# Patient Record
Sex: Female | Born: 1935
Health system: Southern US, Community
[De-identification: ages and names within clinical notes are randomized; demographics above are authoritative.]

## PROBLEM LIST (undated history)

## (undated) DIAGNOSIS — E785 Hyperlipidemia, unspecified: Secondary | ICD-10-CM

## (undated) DIAGNOSIS — I499 Cardiac arrhythmia, unspecified: Secondary | ICD-10-CM

## (undated) DIAGNOSIS — R443 Hallucinations, unspecified: Secondary | ICD-10-CM

## (undated) DIAGNOSIS — R413 Other amnesia: Secondary | ICD-10-CM

## (undated) DIAGNOSIS — K559 Vascular disorder of intestine, unspecified: Secondary | ICD-10-CM

## (undated) DIAGNOSIS — B359 Dermatophytosis, unspecified: Secondary | ICD-10-CM

## (undated) DIAGNOSIS — E669 Obesity, unspecified: Secondary | ICD-10-CM

## (undated) DIAGNOSIS — I10 Essential (primary) hypertension: Secondary | ICD-10-CM

## (undated) DIAGNOSIS — R739 Hyperglycemia, unspecified: Secondary | ICD-10-CM

## (undated) DIAGNOSIS — F0390 Unspecified dementia without behavioral disturbance: Secondary | ICD-10-CM

## (undated) DIAGNOSIS — Q669 Congenital deformity of feet, unspecified, unspecified foot: Secondary | ICD-10-CM

## (undated) DIAGNOSIS — F419 Anxiety disorder, unspecified: Secondary | ICD-10-CM

## (undated) DIAGNOSIS — M75100 Unspecified rotator cuff tear or rupture of unspecified shoulder, not specified as traumatic: Secondary | ICD-10-CM

## (undated) HISTORY — DX: Anxiety disorder, unspecified: F41.9

## (undated) HISTORY — DX: Hallucinations, unspecified: R44.3

## (undated) HISTORY — DX: Hyperlipidemia, unspecified: E78.5

## (undated) HISTORY — DX: Vascular disorder of intestine, unspecified: K55.9

## (undated) HISTORY — DX: Dermatophytosis, unspecified: B35.9

## (undated) HISTORY — DX: Other amnesia: R41.3

## (undated) HISTORY — DX: Hyperglycemia, unspecified: R73.9

## (undated) HISTORY — DX: Congenital deformity of feet, unspecified, unspecified foot: Q66.90

## (undated) HISTORY — DX: Obesity, unspecified: E66.9

## (undated) HISTORY — DX: Essential (primary) hypertension: I10

## (undated) HISTORY — DX: Unspecified rotator cuff tear or rupture of unspecified shoulder, not specified as traumatic: M75.100

---

## 1979-05-02 HISTORY — PX: TOTAL ABDOMINAL HYSTERECTOMY: SHX209

## 1997-10-01 HISTORY — PX: HUMERUS FRACTURE SURGERY: SHX670

## 1998-12-28 ENCOUNTER — Emergency Department (HOSPITAL_COMMUNITY): Admission: EM | Admit: 1998-12-28 | Discharge: 1998-12-28 | Payer: Self-pay | Admitting: Emergency Medicine

## 1998-12-28 ENCOUNTER — Encounter: Payer: Self-pay | Admitting: Emergency Medicine

## 1999-08-11 ENCOUNTER — Encounter: Payer: Self-pay | Admitting: Family Medicine

## 1999-08-11 ENCOUNTER — Encounter: Admission: RE | Admit: 1999-08-11 | Discharge: 1999-08-11 | Payer: Self-pay | Admitting: Family Medicine

## 2000-08-02 ENCOUNTER — Other Ambulatory Visit: Admission: RE | Admit: 2000-08-02 | Discharge: 2000-08-02 | Payer: Self-pay | Admitting: Family Medicine

## 2000-08-13 ENCOUNTER — Encounter: Admission: RE | Admit: 2000-08-13 | Discharge: 2000-08-13 | Payer: Self-pay | Admitting: Family Medicine

## 2000-08-13 ENCOUNTER — Encounter: Payer: Self-pay | Admitting: Family Medicine

## 2002-11-20 ENCOUNTER — Encounter: Admission: RE | Admit: 2002-11-20 | Discharge: 2002-11-20 | Payer: Self-pay | Admitting: Family Medicine

## 2002-11-20 ENCOUNTER — Encounter: Payer: Self-pay | Admitting: Family Medicine

## 2003-12-04 ENCOUNTER — Encounter: Admission: RE | Admit: 2003-12-04 | Discharge: 2003-12-04 | Payer: Self-pay | Admitting: Family Medicine

## 2004-08-13 ENCOUNTER — Ambulatory Visit: Payer: Self-pay | Admitting: Family Medicine

## 2004-11-11 ENCOUNTER — Ambulatory Visit: Payer: Self-pay | Admitting: Family Medicine

## 2004-11-12 ENCOUNTER — Ambulatory Visit: Payer: Self-pay | Admitting: Family Medicine

## 2004-11-27 ENCOUNTER — Ambulatory Visit: Payer: Self-pay | Admitting: Family Medicine

## 2004-12-24 ENCOUNTER — Ambulatory Visit: Payer: Self-pay | Admitting: Family Medicine

## 2005-06-17 ENCOUNTER — Ambulatory Visit: Payer: Self-pay | Admitting: Family Medicine

## 2005-07-17 ENCOUNTER — Emergency Department (HOSPITAL_COMMUNITY): Admission: EM | Admit: 2005-07-17 | Discharge: 2005-07-17 | Payer: Self-pay | Admitting: Emergency Medicine

## 2005-08-10 ENCOUNTER — Ambulatory Visit: Payer: Self-pay | Admitting: Family Medicine

## 2005-08-27 ENCOUNTER — Encounter: Admission: RE | Admit: 2005-08-27 | Discharge: 2005-08-27 | Payer: Self-pay | Admitting: Family Medicine

## 2005-11-09 ENCOUNTER — Ambulatory Visit: Payer: Self-pay | Admitting: Family Medicine

## 2005-11-16 ENCOUNTER — Ambulatory Visit: Payer: Self-pay | Admitting: Family Medicine

## 2005-12-02 ENCOUNTER — Ambulatory Visit: Payer: Self-pay | Admitting: Family Medicine

## 2006-02-22 ENCOUNTER — Ambulatory Visit: Payer: Self-pay | Admitting: Internal Medicine

## 2006-07-06 ENCOUNTER — Ambulatory Visit: Payer: Self-pay | Admitting: Family Medicine

## 2007-01-31 ENCOUNTER — Encounter: Admission: RE | Admit: 2007-01-31 | Discharge: 2007-01-31 | Payer: Self-pay | Admitting: Family Medicine

## 2007-02-09 ENCOUNTER — Encounter (INDEPENDENT_AMBULATORY_CARE_PROVIDER_SITE_OTHER): Payer: Self-pay | Admitting: *Deleted

## 2007-06-24 ENCOUNTER — Ambulatory Visit: Payer: Self-pay | Admitting: Family Medicine

## 2007-07-20 ENCOUNTER — Ambulatory Visit: Payer: Self-pay | Admitting: Family Medicine

## 2007-08-02 ENCOUNTER — Encounter (INDEPENDENT_AMBULATORY_CARE_PROVIDER_SITE_OTHER): Payer: Self-pay | Admitting: *Deleted

## 2007-08-02 ENCOUNTER — Ambulatory Visit: Payer: Self-pay | Admitting: Family Medicine

## 2007-08-03 ENCOUNTER — Ambulatory Visit: Payer: Self-pay | Admitting: Family Medicine

## 2007-08-19 ENCOUNTER — Ambulatory Visit: Payer: Self-pay | Admitting: Family Medicine

## 2007-08-22 ENCOUNTER — Encounter (INDEPENDENT_AMBULATORY_CARE_PROVIDER_SITE_OTHER): Payer: Self-pay | Admitting: *Deleted

## 2008-04-11 ENCOUNTER — Encounter (INDEPENDENT_AMBULATORY_CARE_PROVIDER_SITE_OTHER): Payer: Self-pay | Admitting: *Deleted

## 2008-04-13 ENCOUNTER — Ambulatory Visit: Payer: Self-pay | Admitting: Family Medicine

## 2008-04-25 ENCOUNTER — Ambulatory Visit: Payer: Self-pay

## 2008-04-25 ENCOUNTER — Encounter: Payer: Self-pay | Admitting: Family Medicine

## 2008-05-09 ENCOUNTER — Ambulatory Visit: Payer: Self-pay | Admitting: Family Medicine

## 2008-06-05 ENCOUNTER — Ambulatory Visit: Payer: Self-pay | Admitting: Family Medicine

## 2008-07-17 ENCOUNTER — Encounter: Admission: RE | Admit: 2008-07-17 | Discharge: 2008-07-17 | Payer: Self-pay | Admitting: Family Medicine

## 2008-07-17 LAB — HM MAMMOGRAPHY: HM Mammogram: NORMAL

## 2008-07-18 ENCOUNTER — Encounter (INDEPENDENT_AMBULATORY_CARE_PROVIDER_SITE_OTHER): Payer: Self-pay | Admitting: *Deleted

## 2008-08-16 ENCOUNTER — Ambulatory Visit: Payer: Self-pay | Admitting: Family Medicine

## 2008-08-16 LAB — CONVERTED CEMR LAB
AST: 23 units/L (ref 0–37)
Albumin: 3.9 g/dL (ref 3.5–5.2)
Alkaline Phosphatase: 78 units/L (ref 39–117)
BUN: 13 mg/dL (ref 6–23)
Chloride: 104 meq/L (ref 96–112)
Eosinophils Absolute: 0.2 10*3/uL (ref 0.0–0.7)
Eosinophils Relative: 3.3 % (ref 0.0–5.0)
GFR calc non Af Amer: 87 mL/min
Glucose, Bld: 108 mg/dL — ABNORMAL HIGH (ref 70–99)
HDL: 40.5 mg/dL (ref >39.0)
LDL Cholesterol: 82 mg/dL (ref 0–99)
Lymphocytes Relative: 29.2 % (ref 12.0–46.0)
Monocytes Relative: 10.4 % (ref 3.0–12.0)
Neutrophils Relative %: 56.7 % (ref 43.0–77.0)
Platelets: 241 10*3/uL (ref 150–400)
Potassium: 4.2 meq/L (ref 3.5–5.1)
TSH: 2.83 microintl units/mL (ref 0.35–5.50)
Total CHOL/HDL Ratio: 3.5
VLDL: 18 mg/dL (ref 0–40)
WBC: 5.6 10*3/uL (ref 4.5–10.5)

## 2008-08-21 ENCOUNTER — Ambulatory Visit: Payer: Self-pay | Admitting: Family Medicine

## 2008-08-21 DIAGNOSIS — E749 Disorder of carbohydrate metabolism, unspecified: Secondary | ICD-10-CM | POA: Insufficient documentation

## 2008-09-21 ENCOUNTER — Ambulatory Visit: Payer: Self-pay | Admitting: Family Medicine

## 2008-09-21 ENCOUNTER — Encounter (INDEPENDENT_AMBULATORY_CARE_PROVIDER_SITE_OTHER): Payer: Self-pay | Admitting: *Deleted

## 2008-09-21 LAB — CONVERTED CEMR LAB: OCCULT 3: NEGATIVE

## 2009-05-08 ENCOUNTER — Telehealth: Payer: Self-pay | Admitting: Family Medicine

## 2009-06-05 ENCOUNTER — Ambulatory Visit: Payer: Self-pay | Admitting: Family Medicine

## 2009-09-17 ENCOUNTER — Ambulatory Visit: Payer: Self-pay | Admitting: Family Medicine

## 2009-09-19 LAB — CONVERTED CEMR LAB
ALT: 21 units/L (ref 0–35)
Albumin: 4.3 g/dL (ref 3.5–5.2)
Basophils Relative: 0.8 % (ref 0.0–3.0)
CO2: 30 meq/L (ref 19–32)
Calcium: 9.5 mg/dL (ref 8.4–10.5)
Chloride: 104 meq/L (ref 96–112)
Cholesterol: 153 mg/dL (ref 0–200)
Eosinophils Absolute: 0.2 10*3/uL (ref 0.0–0.7)
Glucose, Bld: 90 mg/dL (ref 70–99)
HCT: 45.2 % (ref 36.0–46.0)
Hemoglobin: 14.3 g/dL (ref 12.0–15.0)
MCHC: 31.6 g/dL (ref 30.0–36.0)
MCV: 94.6 fL (ref 78.0–100.0)
Monocytes Absolute: 0.6 10*3/uL (ref 0.1–1.0)
Neutro Abs: 3.6 10*3/uL (ref 1.4–7.7)
Potassium: 4.7 meq/L (ref 3.5–5.1)
RBC: 4.77 M/uL (ref 3.87–5.11)
TSH: 2.37 microintl units/mL (ref 0.35–5.50)
Total Protein: 7.1 g/dL (ref 6.0–8.3)
VLDL: 29 mg/dL (ref 0.0–40.0)

## 2009-09-25 ENCOUNTER — Ambulatory Visit: Payer: Self-pay | Admitting: Family Medicine

## 2009-09-25 DIAGNOSIS — M719 Bursopathy, unspecified: Secondary | ICD-10-CM

## 2009-09-25 DIAGNOSIS — M67919 Unspecified disorder of synovium and tendon, unspecified shoulder: Secondary | ICD-10-CM | POA: Insufficient documentation

## 2009-10-17 ENCOUNTER — Encounter: Payer: Self-pay | Admitting: Family Medicine

## 2009-11-20 ENCOUNTER — Ambulatory Visit: Payer: Self-pay | Admitting: Family Medicine

## 2009-11-28 ENCOUNTER — Ambulatory Visit: Payer: Self-pay | Admitting: Family Medicine

## 2009-11-29 ENCOUNTER — Encounter: Payer: Self-pay | Admitting: Family Medicine

## 2010-05-22 ENCOUNTER — Ambulatory Visit: Payer: Self-pay | Admitting: Family Medicine

## 2010-09-28 LAB — CONVERTED CEMR LAB
BUN: 12 mg/dL (ref 6–23)
Calcium: 9.6 mg/dL (ref 8.4–10.5)
Creatinine, Ser: 0.7 mg/dL (ref 0.4–1.2)
Creatinine,U: 185.2 mg/dL
GFR calc Af Amer: 106 mL/min
Microalb, Ur: 0.5 mg/dL (ref 0.0–1.9)
Potassium: 3.8 meq/L (ref 3.5–5.1)
Total CHOL/HDL Ratio: 5.7
Triglycerides: 115 mg/dL (ref 0–149)
VLDL: 23 mg/dL (ref 0–40)

## 2010-09-30 NOTE — Miscellaneous (Signed)
Summary: PT Initial Eval/[facility]  PT Initial Eval/[facility]   Imported By: Lanelle Bal 10/19/2009 11:46:49  _____________________________________________________________________  External Attachment:    Type:   Image     Comment:   External Document

## 2010-09-30 NOTE — Letter (Signed)
Summary: Results Follow up Letter  [place] at [facility]  [address] [place], [place] [address]   Phone: [phone]  Fax: [phone]    11/29/2009 MRN: [id number]  [facility] [address] [place], [place]  [address]  Dear Hannah Duran,  The following are the results of your recent test(s):  Test         Result    Pap Smear:        Normal _____  Not Normal _____ Comments: ______________________________________________________ Cholesterol: LDL(Bad cholesterol):         Your goal is less than:         HDL (Good cholesterol):       Your goal is more than: Comments:  ______________________________________________________ Mammogram:        Normal _____  Not Normal _____ Comments:  ___________________________________________________________________ Hemoccult:        Normal __X___  Not normal _______ Comments:  Negative for blood  _____________________________________________________________________ Other Tests:    We routinely do not discuss normal results over the telephone.  If you desire a copy of the results, or you have any questions about this information we can discuss them at your next office visit.   Sincerely,      [name], MD

## 2010-09-30 NOTE — Assessment & Plan Note (Signed)
Summary: FLU SHOT/CLE   Nurse Visit   Allergies: 1)  ! Sulfamethoxazole-Tmp Ds (Sulfamethoxazole-Trimethoprim) 2)  ! Penicillin  Immunizations Administered:  Influenza Vaccine # 1:    Vaccine Type: Fluvax 3+    Site: left deltoid    Mfr: GlaxoSmithKline    Dose: 0.5 ml    Route: IM    Given by: Mervin Hack CMA (AAMA)    Exp. Date: 02/28/2011    Lot #: [id number]    VIS given: 03/25/10 version given May 22, 2010.  Flu Vaccine Consent Questions:    Do you have a history of severe allergic reactions to this vaccine? no    Any prior history of allergic reactions to egg and/or gelatin? no    Do you have a sensitivity to the preservative Thimersol? no    Do you have a past history of Guillan-Barre Syndrome? no    Do you currently have an acute febrile illness? no    Have you ever had a severe reaction to latex? no    Vaccine information given and explained to patient? yes    Are you currently pregnant? no  Orders Added: 1)  Flu Vaccine 23yrs + [90658] 2)  Admin 1st Vaccine [28413]

## 2010-09-30 NOTE — Assessment & Plan Note (Signed)
Summary: SHOULDER PAIN PER DR TOWER/[facility]   Vital Signs:  Patient profile:   77 year old female Height:      65 inches Weight:      239.6 pounds BMI:     40.02 Temp:     97.4 degrees F oral Pulse rate:   68 / minute Pulse rhythm:   regular BP sitting:   110 / 72  (left arm) Cuff size:   large  Vitals Entered By: Benny Lennert CMA Duncan Dull) (September 25, 2009 11:07 AM) CC: RIGHT SHOULDER/ARM PAIN SINCE NOVEMBER Is Patient Diabetic? No   History of Present Illness: The patient noted above presents with shoulder pain that has been ongoing for 3 months there is no history of trauma or accident, but she recalls hurting her shoulder with raking leaves The patient denies neck pain or radicular symptoms. Denies dislocation, subluxation, separation of the shoulder. H/o proximal humerus fx on the left. The patient does complain of pain in the overhead plane and pain with IROM Lateral pain that awakens at night  Medications Tried: motrin Tried PT: No  None on R Prior shoulder Injury: No Prior surgery: No Prior fracture: No  Handedness: R  Allergies: 1)  ! Sulfamethoxazole-Tmp Ds (Sulfamethoxazole-Trimethoprim) 2)  ! Penicillin  Past History:  Past medical, surgical, family and social histories (including risk factors) reviewed, and no changes noted (except as noted below).  Past Medical History: Reviewed history from 09/17/2009 and no changes required. obesity  HTN  hyperlipidemia  hyperglycemia   Past Surgical History: Reviewed history from 08/21/2008 and no changes required. NSVD x2 1 miscarriage TAH due to menometrorrhagia 1980s Fractured Hunerus 10/1997 LE Doppler nml 8/26//2009 ECHO LAE;LVH:(12/1992)  Family History: Reviewed history from 09/17/2009 and no changes required. Father dec 88 Ca Bladder Mother dec 94 NH natural causes/(/STROKE//PARKINSONS//DEMENTIA) only child CV: POSITIVE MGM HBP: POSITIVE MOTHER  DM: NEGATIVE GOUT/ARTHRITIS: POSITIVE  OTHER:  POSITIVE STROKE MOTHER  Social History: Reviewed history from 09/17/2009 and no changes required. Occupation: retired [employer] operational officer Married lives with husb  2 children never smoked  no alcohol  walks for exercise - less in the winter/ aims for several times per week   Review of Systems       REVIEW OF SYSTEMS  GEN: No systemic complaints, no fevers, chills, sweats, or other acute illnesses MSK: Detailed in the HPI GI: tolerating PO intake without difficulty Neuro: No numbness, parasthesias, or tingling associated. Otherwise the pertinent positives of the ROS are noted above.    Physical Exam  General:  GEN: Well-developed,well-nourished,in no acute distress; alert,appropriate and cooperative throughout examination HEENT: Normocephalic and atraumatic without obvious abnormalities. No apparent alopecia or balding. Ears, externally no deformities PULM: Breathing comfortably in no respiratory distress EXT: No clubbing, cyanosis, or edema PSYCH: Normally interactive. Cooperative during the interview. Pleasant. Friendly and conversant. Not anxious or depressed appearing. Normal, full affect.  Msk:  Shoulder: R Inspection: No muscle wasting or winging Ecchymosis/edema: neg  AC joint, scapula, clavicle: NT - mildly tender at [facility] joint Cervical spine: NT, full ROM Spurling's: neg Abduction: full, 5/5 Flexion: full, 5/5 IR, full, lift-off: 5/5 ER at neutral: full, 5/5 AC crossover: pos, r Neer: pos Hawkins: mild + Drop Test: neg Empty Can: pos Supraspinatus insertion: mild  T Bicipital groove: NT Speed's: neg Yergason's: neg Sulcus sign: neg Scapular dyskinesis: none C5-T1 intact  Neuro: Sensation intact Grip 5/5    Impression & Recommendations:  Problem # 1:  ROTATOR CUFF SYNDROME, RIGHT (ICD-726.10) Assessment New Shoulder anatomy  was reviewed with the patient using and anatomical model.   Rotator cuff strengthening and scapular stabilization exercises were reviewed  with the patient.  A handout was given based on a shoulder program from Dr. Graciella Freer of [facility] and the [facility].  Retraining shoulder mechanics and function was emphasized to the patient with rehab done at least 5-6 days a week.  The patient could benefit from formal PT to assist with scapular stabilization and RTC strengthening.   Shoulder not terribly inflammed now - will gauge improvement at f/u and if minimally better, would do subac inj  cc: Dr. Milinda Antis  Problem # 2:  SHOULDER PAIN, RIGHT (ICD-719.41) Assessment: New Probable AC arthropathy  Orders: Physical Therapy Referral (PT)  Complete Medication List: 1)  Lasix 20 Mg Tabs (Furosemide) .... Take one by mouth daily 2)  Lipitor 10 Mg Tabs (Atorvastatin calcium) .Marland Kitchen.. 1 tablet at bedtime 3)  Diovan Hct 80-12.5 Mg Tabs (Valsartan-hydrochlorothiazide) .Marland Kitchen.. 1 daily by mouth 4)  Levaquin 500 Mg Tabs (Levofloxacin) .Marland Kitchen.. 1 by mouth once daily for 10 days for sinus infection 5)  Diclofenac Sodium 75 Mg Tbec (Diclofenac sodium) .Marland Kitchen.. 1 by mouth two times a day  Other Orders: Consultation Level III (13086)  Patient Instructions: 1)  Referral Appointment Information 2)  Day/Date: 3)  Time: 4)  Place/MD: 5)  Address: 6)  Phone/Fax: 7)  Patient given appointment information. Information/Orders faxed/mailed.  Prescriptions: DICLOFENAC SODIUM 75 MG TBEC (DICLOFENAC SODIUM) 1 by mouth two times a day  #60 x 0   Entered and Authorized by:   Hannah Beat MD   Signed by:   Hannah Beat MD on 09/25/2009   Method used:   Electronically to        [facility]  [address] (retail)       [address] [place] [address]       [place], [place]  [address]       Ph: [phone]       Fax: [phone]   RxID:   [id number]   Current Allergies (reviewed today): ! SULFAMETHOXAZOLE-TMP DS (SULFAMETHOXAZOLE-TRIMETHOPRIM) ! PENICILLIN

## 2010-09-30 NOTE — Letter (Signed)
Summary: [facility]: Immunoassay Fecal Occult Blood (iFOB) Order Form  [place] at [facility]  [address] [place], [place] [address]   Phone: [phone]  Fax: [phone]      [facility]: Immunoassay Fecal Occult Blood (iFOB) Order Form   November 20, 2009 MRN: [id number]   Hannah Duran July 17, 1934   Physicican Name:___Tower______________________  Diagnosis Code:____V76.41______________________      Judith Part MD

## 2010-09-30 NOTE — Assessment & Plan Note (Signed)
Summary: PT TRANSFER FROM DR SCHALLER/[facility]   Vital Signs:  Patient profile:   77 year old female Weight:      243 pounds BMI:     40.58 Temp:     97.7 degrees F oral Pulse rate:   68 / minute Pulse rhythm:   regular BP sitting:   130 / 84  (left arm) Cuff size:   large  Vitals Entered By: Lowella Petties CMA (September 17, 2009 11:36 AM) CC: Pt transferring from Dr. Hetty Ely   History of Present Illness: transferring from Dr Hetty Ely today   has a sinus problem -- really bad last week/ caused some inner ear dizziness and nausea some better today  started with a cold - 1 mo ago  got better and then worse last week  facial pain and some drainag -- all clear d/c however  thinks a little fever at first -- and that is better  no  more chills no n/v/d  took some advil - that helps sometimes   also has some tendonitis -- In upper r arm  several months ago -- after raking leaves  uses heat and biogel - and still there - worse pain to fasten her bra  occ rad to shoulder  no numbness or weakness  needs labs for cholesterol-- has been a year -- is on lipitor also hx of hyperglycemia   HTN is well controlled    Allergies: 1)  ! Sulfamethoxazole-Tmp Ds (Sulfamethoxazole-Trimethoprim) 2)  ! Penicillin  Past History:  Past Surgical History: Last updated: 08/21/2008 NSVD x2 1 miscarriage TAH due to menometrorrhagia 1980s Fractured Hunerus 10/1997 LE Doppler nml 8/26//2009 ECHO LAE;LVH:(12/1992)  Family History: Last updated: 09/17/2009 Father dec 88 Ca Bladder Mother dec 94 NH natural causes/(/STROKE//PARKINSONS//DEMENTIA) only child CV: POSITIVE MGM HBP: POSITIVE MOTHER  DM: NEGATIVE GOUT/ARTHRITIS: POSITIVE  OTHER: POSITIVE STROKE MOTHER  Social History: Last updated: 09/17/2009 Occupation: retired [employer] Services operational officer Married lives with husb  2 children never smoked  no alcohol  walks for exercise - less in the winter/ aims for several times per week   Risk  Factors: Caffeine Use: 2 (08/03/2007) Exercise: yes (08/21/2008)  Risk Factors: Smoking Status: never (08/03/2007) Passive Smoke Exposure: no (08/03/2007)  Past Medical History: obesity  HTN  hyperlipidemia  hyperglycemia   Family History: Father dec 88 Ca Bladder Mother dec 94 NH natural causes/(/STROKE//PARKINSONS//DEMENTIA) only child CV: POSITIVE MGM HBP: POSITIVE MOTHER  DM: NEGATIVE GOUT/ARTHRITIS: POSITIVE  OTHER: POSITIVE STROKE MOTHER  Social History: Occupation: retired Diplomatic Services operational officer Married lives with husb  2 children never smoked  no alcohol  walks for exercise - less in the winter/ aims for several times per week   Review of Systems General:  Complains of fatigue; denies chills, fever, loss of appetite, and malaise. Eyes:  Denies blurring, discharge, and eye pain. ENT:  Complains of hoarseness, nasal congestion, postnasal drainage, sinus pressure, and sore throat; denies earache. CV:  Denies chest pain or discomfort, lightheadness, and palpitations. Resp:  Denies cough, shortness of breath, and wheezing. GI:  Denies abdominal pain, change in bowel habits, indigestion, and nausea. GU:  Denies urinary frequency. MS:  Complains of joint pain; denies joint redness, joint swelling, and muscle aches. Derm:  Denies lesion(s), poor wound healing, and rash. Neuro:  Denies numbness, tingling, and weakness. Psych:  Denies anxiety and depression. Endo:  Denies cold intolerance, excessive thirst, excessive urination, and heat intolerance. Heme:  Denies abnormal bruising and bleeding.  Physical Exam  General:  obese and well appearing Head:  Normocephalic and atraumatic without obvious abnormalities. No apparent alopecia or balding. Eyes:  vision grossly intact, pupils equal, pupils round, and pupils reactive to light.   Ears:  R ear normal and L ear normal.   Nose:  nares are congested andboggy Mouth:  pharynx pink and moist, no erythema, and no exudates.   Neck:   supple with full rom and no masses or thyromegally, no JVD or carotid bruit  Chest Wall:  No deformities, masses, or tenderness noted. Lungs:  Normal respiratory effort, chest expands symmetrically. Lungs are clear to auscultation, no crackles or wheezes. Heart:  Normal rate and regular rhythm. S1 and S2 normal without gallop, murmur, click, rub or other extra sounds. Abdomen:  soft and non-tender.   Msk:  No deformity or scoliosis noted of thoracic or lumbar spine.   Pulses:  R and L carotid,radial,femoral,dorsalis pedis and posterior tibial pulses are full and equal bilaterally Extremities:  No clubbing, cyanosis, edema, or deformity noted with normal full range of motion of all joints.   No swelling noted. Neurologic:  sensation intact to light touch, gait normal, and DTRs symmetrical and normal.   Skin:  Intact without suspicious lesions or rashes Cervical Nodes:  No lymphadenopathy noted Psych:  normal affect, talkative and pleasant    Impression & Recommendations:  Problem # 1:  SINUSITIS - ACUTE-NOS (ICD-461.9) Assessment New with facial pain 1 mo after uri is pcn and sulfa all tx with levaquin and update recommend sympt care- see pt instructions   pt advised to update me if symptoms worsen or do not improve  Her updated medication list for this problem includes:    Levaquin 500 Mg Tabs (Levofloxacin) .Marland Kitchen... 1 by mouth once daily for 10 days for sinus infection  Problem # 2:  PURE HYPERCHOLESTEROLEMIA (ICD-272.0) Assessment: Unchanged  due for labs today- has been well controlled with lipitor and diet  will disc at f/u/ check up  rev low sat fat diet  Her updated medication list for this problem includes:    Lipitor 10 Mg Tabs (Atorvastatin calcium) .Marland Kitchen... 1 tablet at bedtime  Orders: Venipuncture (04540) TLB-Lipid Panel (80061-LIPID) TLB-Renal Function Panel (80069-RENAL) TLB-CBC Platelet - w/Differential (85025-CBCD) TLB-Hepatic/Liver Function Pnl  (80076-HEPATIC) TLB-TSH (Thyroid Stimulating Hormone) (84443-TSH)  Labs Reviewed: SGOT: 23 (08/16/2008)   SGPT: 20 (08/16/2008)   HDL:40.5 (08/16/2008), 23.0 (08/02/2007)  LDL:82 (08/16/2008), 85 (08/02/2007)  Chol:140 (08/16/2008), 131 (08/02/2007)  Trig:90 (08/16/2008), 115 (08/02/2007)  Problem # 3:  UNSPECIFIED ESSENTIAL HYPERTENSION (ICD-401.9) Assessment: Unchanged  bp is fairly well controlled on lasix and diovan lab today disc healthy habits  f/u for check up  Her updated medication list for this problem includes:    Lasix 20 Mg Tabs (Furosemide) .Marland Kitchen... Take one by mouth daily    Diovan Hct 80-12.5 Mg Tabs (Valsartan-hydrochlorothiazide) .Marland Kitchen... 1 daily by mouth  Orders: Venipuncture (98119) TLB-Lipid Panel (80061-LIPID) TLB-Renal Function Panel (80069-RENAL) TLB-CBC Platelet - w/Differential (85025-CBCD) TLB-Hepatic/Liver Function Pnl (80076-HEPATIC) TLB-TSH (Thyroid Stimulating Hormone) (84443-TSH)  BP today: 130/84 Prior BP: 120/80 (08/21/2008)  Labs Reviewed: K+: 4.2 (08/16/2008) Creat: : 0.7 (08/16/2008)   Chol: 140 (08/16/2008)   HDL: 40.5 (08/16/2008)   LDL: 82 (08/16/2008)   TG: 90 (08/16/2008)  Problem # 4:  UNSPEC DISORDER CARBOHYDRATE TRANSPORT&METAB (ICD-271.9) Assessment: Unchanged obese with hyperglycemia  stressed importance of low carb eating  /esp getting rid of sweets (simple sugars) will check glucose with labs today disc at f/u  Complete Medication List: 1)  Lasix 20 Mg Tabs (Furosemide) .Marland KitchenMarland KitchenMarland Kitchen  Take one by mouth daily 2)  Lipitor 10 Mg Tabs (Atorvastatin calcium) .Marland Kitchen.. 1 tablet at bedtime 3)  Diovan Hct 80-12.5 Mg Tabs (Valsartan-hydrochlorothiazide) .Marland Kitchen.. 1 daily by mouth 4)  Levaquin 500 Mg Tabs (Levofloxacin) .Marland Kitchen.. 1 by mouth once daily for 10 days for sinus infection  Patient Instructions: 1)  take the levaquin as directed  2)  try nasal saline spray for congestion and drink lots of fluids  3)  mucinex is good for congestion too 4)  update me  in 1 week if not starting to improve  5)  no change in other medicines 6)  labs today  7)  schedule 30 min check up in 1-2 mo  8)  work on healthy diet and exercise -- stop sweets  9)  we will refer you to Dr Patsy Lager for your shoulder when you check out  Prescriptions: LASIX 20 MG TABS (FUROSEMIDE) take one by mouth daily  #30 x 11   Entered and Authorized by:   Judith Part MD   Signed by:   Judith Part MD on 09/17/2009   Method used:   Print then Give to Patient   RxID:   [id number] LIPITOR 10 MG TABS (ATORVASTATIN CALCIUM) 1 TABLET AT BEDTIME  #30 x 11   Entered and Authorized by:   Judith Part MD   Signed by:   Judith Part MD on 09/17/2009   Method used:   Print then Give to Patient   RxID:   [id number] DIOVAN HCT 80-12.5 MG TABS (VALSARTAN-HYDROCHLOROTHIAZIDE) 1 DAILY BY MOUTH  #30 x 11   Entered and Authorized by:   Judith Part MD   Signed by:   Judith Part MD on 09/17/2009   Method used:   Print then Give to Patient   RxID:   [id number] LEVAQUIN 500 MG TABS (LEVOFLOXACIN) 1 by mouth once daily for 10 days for sinus infection  #10 x 0   Entered and Authorized by:   Judith Part MD   Signed by:   Judith Part MD on 09/17/2009   Method used:   Print then Give to Patient   RxID:   [id number]   Prior Medications (reviewed today): LASIX 20 MG TABS (FUROSEMIDE) take one by mouth daily Current Allergies (reviewed today): ! SULFAMETHOXAZOLE-TMP DS (SULFAMETHOXAZOLE-TRIMETHOPRIM) ! PENICILLIN

## 2010-09-30 NOTE — Assessment & Plan Note (Signed)
Summary: 30 MIN CHECK UP/[facility]   Vital Signs:  Patient profile:   77 year old female Height:      65 inches Weight:      234.25 pounds BMI:     39.12 Temp:     97.8 degrees F oral Pulse rate:   80 / minute Pulse rhythm:   regular BP sitting:   130 / 82  (left arm) Cuff size:   regular  Vitals Entered By: Linde Gillis CMA Duncan Dull) (November 20, 2009 2:08 PM) CC: 30 minute exam   History of Present Illness: here for 30 min check up of chronic med problems  is doing ok overall   much stress - husband in CCU for 11 days -- pneumonia -- getting better very slowly handling it as well as she can    wt is down 5 lb this visit -- with bmi of 39 possibly not eating much due to this and doing more work   bp stable 130/82  lipids were good on statin and diet trig 145/ HDL 39 Nd LDL 85  hx of hyperglyemia - sugar was 90 fasting -- good has resisted desserts and ice cream   last pap 01-- total hyst and does not need one  no gyn problems    mam 11/09 self exam -- no changes  had a yeast infx under breast -- and had to go to derm  (also removed a few moles)   TD 02   got flu shot  ? pneumovax- is interested in this  ? shingles- not sure if she wants this   OP screen is taking vitamins -- ? if ca and D   colon cancer screen -has done stool cards in past  not interested in colonoscopy   Allergies: 1)  ! Sulfamethoxazole-Tmp Ds (Sulfamethoxazole-Trimethoprim) 2)  ! Penicillin  Past History:  Past Surgical History: Last updated: 08/21/2008 NSVD x2 1 miscarriage TAH due to menometrorrhagia 1980s Fractured Hunerus 10/1997 LE Doppler nml 8/26//2009 ECHO LAE;LVH:(12/1992)  Family History: Last updated: 09/17/2009 Father dec 88 Ca Bladder Mother dec 94 NH natural causes/(/STROKE//PARKINSONS//DEMENTIA) only child CV: POSITIVE MGM HBP: POSITIVE MOTHER  DM: NEGATIVE GOUT/ARTHRITIS: POSITIVE  OTHER: POSITIVE STROKE MOTHER  Social History: Last updated:  09/17/2009 Occupation: retired [employer] operational officer Married lives with husb  2 children never smoked  no alcohol  walks for exercise - less in the winter/ aims for several times per week   Risk Factors: Caffeine Use: 2 (08/03/2007) Exercise: yes (08/21/2008)  Risk Factors: Smoking Status: never (08/03/2007) Passive Smoke Exposure: no (08/03/2007)  Past Medical History: obesity  HTN  hyperlipidemia  hyperglycemia       derm- Dr Orson Aloe  Review of Systems General:  Complains of fatigue; denies fever, loss of appetite, and malaise. Eyes:  Denies blurring and eye pain. CV:  Denies chest pain or discomfort, lightheadness, and palpitations. Resp:  Denies cough, shortness of breath, and wheezing. GI:  Denies abdominal pain, bloody stools, change in bowel habits, indigestion, and nausea. GU:  Denies abnormal vaginal bleeding, discharge, dysuria, and urinary frequency. MS:  Complains of stiffness; denies joint redness and joint swelling. Derm:  Denies itching, lesion(s), poor wound healing, and rash. Neuro:  Denies numbness and tingling. Psych:  is stressed over husband . Endo:  Denies cold intolerance, excessive thirst, excessive urination, and heat intolerance. Heme:  Denies abnormal bruising and bleeding.  Physical Exam  General:  overweight but generally well appearing  Head:  normocephalic, atraumatic, and no abnormalities observed.  Eyes:  vision grossly intact, pupils equal, pupils round, and pupils reactive to light.  no conjunctival pallor, injection or icterus  Ears:  R ear normal and L ear normal.   Nose:  no nasal discharge.   Mouth:  pharynx pink and moist.   Neck:  supple with full rom and no masses or thyromegally, no JVD or carotid bruit  Chest Wall:  No deformities, masses, or tenderness noted. Breasts:  No mass, nodules, thickening, tenderness, bulging, retraction, inflamation, nipple discharge or skin changes noted.   Lungs:  Normal respiratory effort, chest  expands symmetrically. Lungs are clear to auscultation, no crackles or wheezes. Heart:  Normal rate and regular rhythm. S1 and S2 normal without gallop, murmur, click, rub or other extra sounds. Abdomen:  Bowel sounds positive,abdomen soft and non-tender without masses, organomegaly or hernias noted. no renal bruits  Msk:  No deformity or scoliosis noted of thoracic or lumbar spine.  no acute joint changes  Pulses:  R and L carotid,radial,femoral,dorsalis pedis and posterior tibial pulses are full and equal bilaterally Extremities:  No clubbing, cyanosis, edema, or deformity noted with normal full range of motion of all joints.   Neurologic:  sensation intact to light touch, gait normal, and DTRs symmetrical and normal.   Skin:  Intact without suspicious lesions or rashes Cervical Nodes:  No lymphadenopathy noted Axillary Nodes:  No palpable lymphadenopathy Inguinal Nodes:  No significant adenopathy Psych:  normal affect, talkative and pleasant    Impression & Recommendations:  Problem # 1:  PURE HYPERCHOLESTEROLEMIA (ICD-272.0) Assessment Unchanged  overall good control with statin and diet  rev low sat fat diet med renewed - re check 1 year Her updated medication list for this problem includes:    Lipitor 10 Mg Tabs (Atorvastatin calcium) .Marland Kitchen... 1 tablet at bedtime  Labs Reviewed: SGOT: 25 (09/17/2009)   SGPT: 21 (09/17/2009)   HDL:39.50 (09/17/2009), 40.5 (08/16/2008)  LDL:85 (09/17/2009), 82 (08/16/2008)  Chol:153 (09/17/2009), 140 (08/16/2008)  Trig:145.0 (09/17/2009), 90 (08/16/2008)  Problem # 2:  OBESITY, UNSPECIFIED (ICD-278.00) Assessment: Comment Only commended on wt loss so far  more loss would benefit health in many ways   Problem # 3:  UNSPECIFIED ESSENTIAL HYPERTENSION (ICD-401.9) Assessment: Unchanged  bp remains in stable control with current med no changes urged low sodium diet and exercise  rev labs in detail today Her updated medication list for this  problem includes:    Lasix 20 Mg Tabs (Furosemide) .Marland Kitchen... Take one by mouth daily    Diovan Hct 80-12.5 Mg Tabs (Valsartan-hydrochlorothiazide) .Marland Kitchen... 1 daily by mouth  BP today: 130/82 Prior BP: 110/72 (09/25/2009)  Labs Reviewed: K+: 4.7 (09/17/2009) Creat: : 0.8 (09/17/2009)   Chol: 153 (09/17/2009)   HDL: 39.50 (09/17/2009)   LDL: 85 (09/17/2009)   TG: 145.0 (09/17/2009)  Problem # 4:  UNSPEC DISORDER CARBOHYDRATE TRANSPORT&METAB (ICD-271.9) Assessment: Improved fasting sugar is 90- on lower sugar diet commended on that- will continue to follow  Problem # 5:  Preventive Health Care (ICD-V70.0) Assessment: Comment Only pneumovax today  will call back to sched mam and dexa  rec ca and D for bone health  Complete Medication List: 1)  Lasix 20 Mg Tabs (Furosemide) .... Take one by mouth daily 2)  Lipitor 10 Mg Tabs (Atorvastatin calcium) .Marland Kitchen.. 1 tablet at bedtime 3)  Diovan Hct 80-12.5 Mg Tabs (Valsartan-hydrochlorothiazide) .Marland Kitchen.. 1 daily by mouth 4)  Diclofenac Sodium 75 Mg Tbec (Diclofenac sodium) .Marland Kitchen.. 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 Horticulturist, commercial) [phone])  Patient Instructions: 1)  you are due for a mammogram so call us when you are ready to schedule that, also for a bone density test  2)  the current recommendation for calcium intake is 1200-1500 mg daily with-1000 IU of vitamin D  3)  pneumonia vaccine today  4)  please do stool test for colon cancer screening  5)  keep working on weight loss and healthy diet and exercise  6)  no change in medications  7)  if you are interested in a shingles vaccine in the future- check with your insurance about coverage and call in the summer- after june   Current Allergies (reviewed today): ! SULFAMETHOXAZOLE-TMP DS (SULFAMETHOXAZOLE-TRIMETHOPRIM) ! PENICILLIN     Pneumovax Vaccine    Vaccine Type: Pneumovax    Site: left deltoid    Mfr: Merck    Dose:  0.5 ml    Route: IM    Given by: Lewanda Rife LPN    Exp. Date: 04/14/2011    Lot #: [id number]    VIS given: 03/28/96 version given November 20, 2009.

## 2010-10-08 ENCOUNTER — Encounter: Payer: Self-pay | Admitting: Family Medicine

## 2010-10-08 DIAGNOSIS — E119 Type 2 diabetes mellitus without complications: Secondary | ICD-10-CM | POA: Insufficient documentation

## 2010-10-08 DIAGNOSIS — E669 Obesity, unspecified: Secondary | ICD-10-CM

## 2010-10-08 DIAGNOSIS — I10 Essential (primary) hypertension: Secondary | ICD-10-CM

## 2010-10-08 DIAGNOSIS — E1169 Type 2 diabetes mellitus with other specified complication: Secondary | ICD-10-CM | POA: Insufficient documentation

## 2010-10-08 DIAGNOSIS — R739 Hyperglycemia, unspecified: Secondary | ICD-10-CM

## 2010-10-08 DIAGNOSIS — E785 Hyperlipidemia, unspecified: Secondary | ICD-10-CM

## 2010-11-17 ENCOUNTER — Other Ambulatory Visit (INDEPENDENT_AMBULATORY_CARE_PROVIDER_SITE_OTHER): Payer: Medicare Other

## 2010-11-17 ENCOUNTER — Encounter: Payer: Self-pay | Admitting: *Deleted

## 2010-11-17 ENCOUNTER — Other Ambulatory Visit: Payer: Self-pay | Admitting: Family Medicine

## 2010-11-17 DIAGNOSIS — E7800 Pure hypercholesterolemia, unspecified: Secondary | ICD-10-CM

## 2010-11-17 DIAGNOSIS — E669 Obesity, unspecified: Secondary | ICD-10-CM

## 2010-11-17 DIAGNOSIS — E749 Disorder of carbohydrate metabolism, unspecified: Secondary | ICD-10-CM

## 2010-11-17 DIAGNOSIS — I10 Essential (primary) hypertension: Secondary | ICD-10-CM

## 2010-11-17 LAB — LIPID PANEL
HDL: 41.4 mg/dL (ref >39.00)
LDL Cholesterol: 99 mg/dL (ref 0–99)
Total CHOL/HDL Ratio: 4
Triglycerides: 102 mg/dL (ref 0.0–149.0)
VLDL: 20.4 mg/dL (ref 0.0–40.0)

## 2010-11-17 LAB — BASIC METABOLIC PANEL
Calcium: 8.9 mg/dL (ref 8.4–10.5)
Creatinine, Ser: 0.9 mg/dL (ref 0.4–1.2)
GFR: 69.38 mL/min (ref >60.00)
Glucose, Bld: 105 mg/dL — ABNORMAL HIGH (ref 70–99)
Sodium: 136 mEq/L (ref 135–145)

## 2010-11-17 LAB — CBC WITH DIFFERENTIAL/PLATELET
Basophils Absolute: 0.1 10*3/uL (ref 0.0–0.1)
Hemoglobin: 13.8 g/dL (ref 12.0–15.0)
Lymphocytes Relative: 32.8 % (ref 12.0–46.0)
Monocytes Relative: 10.6 % (ref 3.0–12.0)
Neutro Abs: 3.8 10*3/uL (ref 1.4–7.7)
Neutrophils Relative %: 53 % (ref 43.0–77.0)
RBC: 4.4 Mil/uL (ref 3.87–5.11)
RDW: 14.1 % (ref 11.5–14.6)

## 2010-11-17 LAB — HEPATIC FUNCTION PANEL
AST: 20 U/L (ref 0–37)
Albumin: 3.9 g/dL (ref 3.5–5.2)
Alkaline Phosphatase: 78 U/L (ref 39–117)
Bilirubin, Direct: 0.1 mg/dL (ref 0.0–0.3)

## 2010-11-17 LAB — MICROALBUMIN / CREATININE URINE RATIO: Microalb, Ur: 2.2 mg/dL — ABNORMAL HIGH (ref 0.0–1.9)

## 2010-11-25 ENCOUNTER — Other Ambulatory Visit: Payer: Self-pay

## 2010-11-27 ENCOUNTER — Encounter: Payer: Self-pay | Admitting: Family Medicine

## 2010-12-01 ENCOUNTER — Encounter: Payer: Self-pay | Admitting: Family Medicine

## 2010-12-01 ENCOUNTER — Ambulatory Visit (INDEPENDENT_AMBULATORY_CARE_PROVIDER_SITE_OTHER): Payer: Medicare Other | Admitting: Family Medicine

## 2010-12-01 DIAGNOSIS — R739 Hyperglycemia, unspecified: Secondary | ICD-10-CM

## 2010-12-01 DIAGNOSIS — R7309 Other abnormal glucose: Secondary | ICD-10-CM

## 2010-12-01 DIAGNOSIS — Z1231 Encounter for screening mammogram for malignant neoplasm of breast: Secondary | ICD-10-CM

## 2010-12-01 DIAGNOSIS — Z0000 Encounter for general adult medical examination without abnormal findings: Secondary | ICD-10-CM

## 2010-12-01 DIAGNOSIS — Z23 Encounter for immunization: Secondary | ICD-10-CM

## 2010-12-01 DIAGNOSIS — I10 Essential (primary) hypertension: Secondary | ICD-10-CM

## 2010-12-01 DIAGNOSIS — E785 Hyperlipidemia, unspecified: Secondary | ICD-10-CM

## 2010-12-01 DIAGNOSIS — E7800 Pure hypercholesterolemia, unspecified: Secondary | ICD-10-CM

## 2010-12-01 NOTE — Patient Instructions (Addendum)
We will schedule screening mammogram at check out  Tdap vaccine today Let me know in future if you want shingles vaccine - check with insurance Aim for 1200-1500 mg calcium daily with 1000 iu vitamin D for bone health I recommend exercise 5 days per week for 20 minutes or more  Work on weight loss Follow up in 6 months

## 2010-12-01 NOTE — Assessment & Plan Note (Signed)
Stable on statin  Rev labs with pt  Disc low sat fat diet and need for exercise

## 2010-12-01 NOTE — Assessment & Plan Note (Signed)
sched annual mam  Exam today nl  Enc regular self exams

## 2010-12-01 NOTE — Assessment & Plan Note (Signed)
This is fairly controlled without change No change in meds Rev bp  Rev lifestyle change needed incl wt loss

## 2010-12-01 NOTE — Progress Notes (Signed)
Subjective:    Patient ID: Hannah Duran, female    DOB: 11/12/1933, 77 y.o.   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 - very stressful -not much help with that  Does yardwork which is good for her  Diet is so/ so -- no red meat  More veg than she was   HTN stable bp is 132/84 On diovan hct and lasix  Symptoms   Hyperglycemia - fasting gluc 105- watching diet No DM symptoms  Not a lot of time to exercise   Hyperlipidemia On statin - overall stable with LDL to goal No side eff Diet  Lab Results  Component Value Date   CHOL 161 11/17/2010   CHOL 153 09/17/2009   CHOL 140 08/16/2008   Lab Results  Component Value Date   HDL 41.40 11/17/2010   HDL 40.98 09/17/2009   HDL 11.9 08/16/2008   Lab Results  Component Value Date   LDLCALC 99 11/17/2010   LDLCALC 85 09/17/2009   LDLCALC 82 08/16/2008   Lab Results  Component Value Date   TRIG 102.0 11/17/2010   TRIG 145.0 09/17/2009   TRIG 90 08/16/2008   Lab Results  Component Value Date   CHOLHDL 4 11/17/2010   CHOLHDL 4 09/17/2009   CHOLHDL 3.5 CALC 08/16/2008     Td 2002- will update that today  Zoster- ? interested  Mam 11/09--wants to go ahead and schedule [facility] Self exam - no lumps   Gyn care --had total hyst in past  No symptoms or problems at all   Colon screen- not interested  Did stool card last year - does not want to do another   Bone density not interested in one at all   Ca and D-- not taking   Past Medical History  Diagnosis Date  . Obesity   . HTN (hypertension)   . HLD (hyperlipidemia)   . Hyperglycemia    Past Surgical History  Procedure Date  . Total abdominal  hysterectomy 1980's    due to menometrorrhagia  . Humerus fracture surgery 10/1997    reports that she has never smoked. She does not have any smokeless tobacco history on file. She reports that she does not drink alcohol. Her drug history not on file. family history includes Cancer in her father; Dementia in her mother; Hypertension in her mother; Parkinsonism in her mother; and Stroke in her mother. Allergies  Allergen Reactions  . Penicillins     REACTION: Rash  . Sulfamethoxazole W/Trimethoprim       Review of Systems  Constitutional: Positive for fatigue. Negative for activity change, appetite change and unexpected weight change.  HENT: Negative for sinus pressure.   Eyes: Negative for pain and visual disturbance.  Respiratory: Negative for cough, chest tightness and wheezing.   Cardiovascular: Negative.   Gastrointestinal: Negative for nausea, abdominal pain, diarrhea and constipation.  Genitourinary: Negative for urgency, frequency, vaginal bleeding, vaginal discharge and pelvic pain.  Musculoskeletal: Negative for myalgias.  Skin: Negative for color change and pallor.  Neurological: Negative for tremors, weakness, light-headedness and headaches.  Hematological: Negative for adenopathy.  Does not bruise/bleed easily.  Psychiatric/Behavioral: Negative for dysphoric mood. The patient is not nervous/anxious.        Objective:   Physical Exam  Vitals reviewed. Constitutional: She appears well-developed and well-nourished.       overwt and well appearing   HENT:  Head: Normocephalic and atraumatic.  Right Ear: External ear normal.  Left Ear: External ear normal.  Nose: Nose normal.  Mouth/Throat: Oropharynx is clear and moist.  Eyes: Conjunctivae and EOM are normal. Pupils are equal, round, and reactive to light.  Neck: Normal range of motion. Neck supple. No JVD present. Carotid bruit is not present. Erythema present. No thyromegaly present.  Cardiovascular: Normal rate  and normal heart sounds.   No murmur heard. Pulmonary/Chest: Effort normal and breath sounds normal. She exhibits no tenderness.  Abdominal: Soft. Bowel sounds are normal. She exhibits no distension and no mass. There is no rebound.  Genitourinary: No breast swelling, tenderness, discharge or bleeding.  Musculoskeletal: Normal range of motion. She exhibits no edema and no tenderness.  Lymphadenopathy:    She has no cervical adenopathy.  Neurological: She is alert. She has normal reflexes. She exhibits normal muscle tone. Coordination normal.  Skin: Skin is warm and dry.  Psychiatric: She has a normal mood and affect.          Assessment & Plan:

## 2010-12-01 NOTE — Assessment & Plan Note (Signed)
Disc lower glycemic diet and need for wt loss Disc complex carbs and more green veg Disc need for exercise 5 d per week

## 2010-12-30 ENCOUNTER — Ambulatory Visit
Admission: RE | Admit: 2010-12-30 | Discharge: 2010-12-30 | Disposition: A | Discharge status: Home / Self Care | Payer: Medicare Other | Source: Ambulatory Visit | Attending: Family Medicine | Admitting: Family Medicine

## 2010-12-30 DIAGNOSIS — Z1231 Encounter for screening mammogram for malignant neoplasm of breast: Secondary | ICD-10-CM

## 2011-01-06 ENCOUNTER — Encounter: Payer: Self-pay | Admitting: *Deleted

## 2011-02-13 ENCOUNTER — Encounter: Payer: Self-pay | Admitting: Cardiovascular Disease

## 2011-02-19 ENCOUNTER — Other Ambulatory Visit: Payer: Self-pay | Admitting: Family Medicine

## 2011-03-02 ENCOUNTER — Other Ambulatory Visit: Payer: Self-pay | Admitting: Family Medicine

## 2011-03-02 NOTE — Telephone Encounter (Signed)
Please ask pt that question-- thanks  , I'm not sure

## 2011-03-02 NOTE — Telephone Encounter (Signed)
[facility] [address] electronically request refill on Lipitor 10mg  taking 1 tablet by mouth at bedtime. Was listed as DAW and when I was going to do refill question came up on screen is DAW necessary.Please advise.

## 2011-03-03 MED ORDER — ATORVASTATIN CALCIUM 10 MG PO TABS: 10.0000 mg | ORAL_TABLET | Freq: Every day | ORAL | Status: DC

## 2011-03-03 NOTE — Telephone Encounter (Signed)
Please ask pt if she needs daw--thanks

## 2011-03-03 NOTE — Telephone Encounter (Signed)
[facility] [address] electronically request refill for Lipitor 10mg  take 1 daily. #30 with 6 refills. Spoke with pt and she prefers generic.

## 2011-03-03 NOTE — Telephone Encounter (Signed)
Thanks- will go ahead and send for generic

## 2011-04-13 ENCOUNTER — Other Ambulatory Visit: Payer: Self-pay | Admitting: Family Medicine

## 2011-04-14 NOTE — Telephone Encounter (Signed)
[facility] [address] electronically request refill on Dovan HCT 80-12.5mg  #30 x 6. Pt has appt 06/02/11 with Dr Milinda Antis already scheduled.

## 2011-06-02 ENCOUNTER — Ambulatory Visit (INDEPENDENT_AMBULATORY_CARE_PROVIDER_SITE_OTHER): Payer: Medicare Other | Admitting: Family Medicine

## 2011-06-02 ENCOUNTER — Encounter: Payer: Self-pay | Admitting: Family Medicine

## 2011-06-02 DIAGNOSIS — E785 Hyperlipidemia, unspecified: Secondary | ICD-10-CM

## 2011-06-02 DIAGNOSIS — E669 Obesity, unspecified: Secondary | ICD-10-CM

## 2011-06-02 DIAGNOSIS — I10 Essential (primary) hypertension: Secondary | ICD-10-CM

## 2011-06-02 DIAGNOSIS — R739 Hyperglycemia, unspecified: Secondary | ICD-10-CM

## 2011-06-02 DIAGNOSIS — R7309 Other abnormal glucose: Secondary | ICD-10-CM

## 2011-06-02 LAB — RENAL FUNCTION PANEL
Albumin: 4.1 g/dL (ref 3.5–5.2)
BUN: 16 mg/dL (ref 6–23)
CO2: 31 mEq/L (ref 19–32)
Calcium: 9 mg/dL (ref 8.4–10.5)
Chloride: 103 mEq/L (ref 96–112)
Creatinine, Ser: 0.8 mg/dL (ref 0.4–1.2)

## 2011-06-02 NOTE — Assessment & Plan Note (Signed)
bp is in very good control with current meds  Stressed imp of we loss and exercise  Renal panel today F/u 6 mo if it is ok

## 2011-06-02 NOTE — Assessment & Plan Note (Signed)
Again disc imp of wt loss for all aspects of her health  This is not her current priority - but disc baby steps of eating breakfast and cutting down sugar

## 2011-06-02 NOTE — Assessment & Plan Note (Signed)
Good last check with lipitor and low sat fat diet Rev those labs Doing well Rev low sat fat diet

## 2011-06-02 NOTE — Progress Notes (Signed)
Subjective:    Patient ID: Hannah Duran, female    DOB: [birth year], 77 y.o.   MRN: [id number]  HPI Here for f/u of HTN and hyperlipidemia and obesity and hyperglycemia  Feels well overall  Had a bit of vertigo last week for 2 days- better now  Is better  ? If allergy related   bp is stable 124/76 On diovan hct and also lasix No cp or ha or edema  No side eff of med at all   Is walking some - not regularly probably about 2  Does cut the grass and uses push mower -- 1 per week  Also works on a farm -- and cleaning sheds/ etc   Has not gained any weight  Is proud of that  Less appetite lately  Does not eat breakfast  Not very motivated for wt loss   Last visit disc zoster vaccine - is interested if ins pays  Will return to flu shot clinic with her husband   Lipids on lipitor and diet  Last lipids well controlled in spring Lab Results  Component Value Date   CHOL 161 11/17/2010   CHOL 153 09/17/2009   CHOL 140 08/16/2008   Lab Results  Component Value Date   HDL 41.40 11/17/2010   HDL 11.91 09/17/2009   HDL 47.8 08/16/2008   Lab Results  Component Value Date   LDLCALC 99 11/17/2010   LDLCALC 85 09/17/2009   LDLCALC 82 08/16/2008   Lab Results  Component Value Date   TRIG 102.0 11/17/2010   TRIG 145.0 09/17/2009   TRIG 90 08/16/2008   Lab Results  Component Value Date   CHOLHDL 4 11/17/2010   CHOLHDL 4 09/17/2009   CHOLHDL 3.5 CALC 08/16/2008   No results found for this basename: LDLDIRECT    Diet -watches out for fried foods and red meat  No side eff to statin -thankful for that   Hyperglycemia - diet- does eat a lot of fruit No thirst or urination increase Is having some numb fingertips at night - not every day  Does not really watch sugar in diet or portions/ however  Patient Active Problem List  Diagnoses  . PURE HYPERCHOLESTEROLEMIA  . Obesity, Unspecified  . UNSPECIFIED ESSENTIAL HYPERTENSION  . ROTATOR CUFF SYNDROME, RIGHT  . Obesity  . HTN  (hypertension)  . HLD (hyperlipidemia)  . Hyperglycemia  . Other screening mammogram   Past Medical History  Diagnosis Date  . Obesity   . HTN (hypertension)   . HLD (hyperlipidemia)   . Hyperglycemia    Past Surgical History  Procedure Date  . Total abdominal hysterectomy 1980's    due to menometrorrhagia  . Humerus fracture surgery 10/1997   History  Substance Use Topics  . Smoking status: Never Smoker   . Smokeless tobacco: Not on file  . Alcohol Use: No   Family History  Problem Relation Age of Onset  . Cancer Father     bladder  . Stroke Mother   . Parkinsonism Mother   . Dementia Mother   . Hypertension Mother    Allergies  Allergen Reactions  . Penicillins     REACTION: Rash  . Sulfamethoxazole W/Trimethoprim    Current Outpatient Prescriptions on File Prior to Visit  Medication Sig Dispense Refill  . atorvastatin (LIPITOR) 10 MG tablet Take 1 tablet (10 mg total) by mouth daily. Generic please   30 tablet  11  . DIOVAN HCT 80-12.5 MG per tablet TAKE ONE TABLET  BY MOUTH EVERY DAY  30 each  6  . furosemide (LASIX) 20 MG tablet TAKE ONE TABLET BY MOUTH EVERY DAY  30 tablet  4  . Multiple Vitamin (MULTIVITAMIN PO) 1 tablet by mouth daily.           Review of Systems Review of Systems  Constitutional: Negative for fever, appetite change, fatigue and unexpected weight change.  Eyes: Negative for pain and visual disturbance.  Respiratory: Negative for cough and shortness of breath.   Cardiovascular: Negative for cp or palpitations    Gastrointestinal: Negative for nausea, diarrhea and constipation.  Genitourinary: Negative for urgency and frequency.  Skin: Negative for pallor or rash   Neurological: Negative for weakness, light-headedness, and headaches.  Hematological: Negative for adenopathy. Does not bruise/bleed easily.  Psychiatric/Behavioral: Negative for dysphoric mood. The patient is not nervous/anxious.          Objective:   Physical Exam    Constitutional: She appears well-developed and well-nourished. No distress.       Obese and well app  HENT:  Head: Normocephalic and atraumatic.  Mouth/Throat: Oropharynx is clear and moist.  Eyes: Conjunctivae and EOM are normal. Pupils are equal, round, and reactive to light.  Neck: Normal range of motion. Neck supple. No JVD present. Carotid bruit is not present. No thyromegaly present.  Cardiovascular: Normal rate, regular rhythm, normal heart sounds and intact distal pulses.  Exam reveals no gallop.   Pulmonary/Chest: Effort normal and breath sounds normal. No respiratory distress. She has no wheezes.  Abdominal: Soft. Bowel sounds are normal. She exhibits no distension, no abdominal bruit and no mass. There is no tenderness.  Musculoskeletal: Normal range of motion. She exhibits no edema and no tenderness.  Lymphadenopathy:    She has no cervical adenopathy.  Neurological: She is alert. She has normal reflexes. No cranial nerve deficit or sensory deficit. She exhibits normal muscle tone. Coordination normal.  Skin: Skin is warm and dry. No rash noted. No erythema. No pallor.  Psychiatric: She has a normal mood and affect.          Assessment & Plan:

## 2011-06-02 NOTE — Patient Instructions (Signed)
Labs today for sugar (? Diabetes ) and also kidney function  Keep up good fluid intake while you are working Do something physical every day if you can for 30 minutes  Most important thing you can do for your health is loose weight  Cut portions if you can by 1/3 , eat healthy foods and start day with a little breakfast- even though you are not hungry it is very important  Follow up with me in 6 months

## 2011-06-02 NOTE — Assessment & Plan Note (Signed)
Pt is morbidly obese and exp some numbness in fingertips  Stressed imp of wt loss to prev DM a1c and renal panel today and advise

## 2011-06-04 ENCOUNTER — Ambulatory Visit (INDEPENDENT_AMBULATORY_CARE_PROVIDER_SITE_OTHER): Payer: Medicare Other

## 2011-06-04 DIAGNOSIS — Z23 Encounter for immunization: Secondary | ICD-10-CM

## 2011-06-04 LAB — HEMOGLOBIN A1C: Hgb A1c MFr Bld: 6.4 % (ref 4.6–6.5)

## 2011-07-24 ENCOUNTER — Ambulatory Visit (INDEPENDENT_AMBULATORY_CARE_PROVIDER_SITE_OTHER): Payer: Medicare Other | Admitting: Family Medicine

## 2011-07-24 ENCOUNTER — Encounter: Payer: Self-pay | Admitting: Family Medicine

## 2011-07-24 VITALS — BP 120/60 | HR 62 | Temp 98.1°F | Ht 67.0 in | Wt 247.1 lb

## 2011-07-24 DIAGNOSIS — J0100 Acute maxillary sinusitis, unspecified: Secondary | ICD-10-CM

## 2011-07-24 MED ORDER — AZITHROMYCIN 250 MG PO TABS: ORAL_TABLET | ORAL | Status: AC

## 2011-07-24 NOTE — Progress Notes (Signed)
  Patient Name: Hannah Duran Date of Birth: [birth year] Age: 77 y.o. Medical Record Number: [id number] Gender: female  History of Present Illness:  Hannah Duran is a 77 y.o. very pleasant female patient who presents with the following:  Was feeling bad in head, took some zytec. Took some tylenol, and did not really help at all. Yesterday got worse. Pain behind. No pain in jaw.   Sinus Pain: Patient complains of bilateral ear pressure/pain, facial pain, fever subj, headache described as frontal, low grade fever, nasal congestion, purulent nasal discharge, sinus pressure and sore throat. Symptoms include above with chills. Onset of symptoms was 1 week ago, gradually worsening since that time. She is drinking moderate amounts of fluids.  Past history is significant for no history of pneumonia or bronchitis. Patient is non-smoker  Past Medical History, Surgical History, Social History, Family History, and Problem List have been reviewed in EHR and updated if relevant.  Review of Systems: ROS: GEN: Acute illness details above GI: Tolerating PO intake GU: maintaining adequate hydration and urination Pulm: No SOB Interactive and getting along well at home.  Otherwise, ROS is as per the HPI.   Physical Examination: Filed Vitals:   07/24/11 1032  BP: 120/60  Pulse: 62  Temp: 98.1 F (36.7 C)  TempSrc: Oral  Height: 5\' 7"  (1.702 m)  Weight: 247 lb 1.9 oz (112.093 kg)  SpO2: 100%     Gen: WDWN, NAD; alert,appropriate and cooperative throughout exam  HEENT: Normocephalic and atraumatic. Throat clear, w/o exudate, no LAD, R TM clear, L TM - good landmarks, No fluid present. rhinnorhea.  Left frontal and maxillary sinuses: Tender at max Right frontal and maxillary sinuses: Tenderat max  Neck: No ant or post LAD CV: RRR, No M/G/R Pulm: Breathing comfortably in no resp distress. no w/c/r Abd: S,NT,ND,+BS Extr: no c/c/e Psych: full affect, pleasant   Assessment and Plan: Acute  sinusitis: ABX as below.  Refer to the patient instructions sections for details of plan shared with patient.  Reviewed symptomatic care as well as ABX in this case.

## 2011-11-23 ENCOUNTER — Other Ambulatory Visit: Payer: Self-pay | Admitting: Family Medicine

## 2011-11-23 DIAGNOSIS — R739 Hyperglycemia, unspecified: Secondary | ICD-10-CM

## 2011-11-24 ENCOUNTER — Other Ambulatory Visit (INDEPENDENT_AMBULATORY_CARE_PROVIDER_SITE_OTHER): Payer: Medicare Other

## 2011-11-24 DIAGNOSIS — R7309 Other abnormal glucose: Secondary | ICD-10-CM

## 2011-11-24 DIAGNOSIS — R739 Hyperglycemia, unspecified: Secondary | ICD-10-CM

## 2011-12-01 ENCOUNTER — Encounter: Payer: Self-pay | Admitting: Family Medicine

## 2011-12-01 ENCOUNTER — Ambulatory Visit (INDEPENDENT_AMBULATORY_CARE_PROVIDER_SITE_OTHER): Payer: Medicare Other | Admitting: Family Medicine

## 2011-12-01 VITALS — BP 130/80 | HR 60 | Temp 97.8°F | Ht 67.0 in | Wt 251.0 lb

## 2011-12-01 DIAGNOSIS — I10 Essential (primary) hypertension: Secondary | ICD-10-CM

## 2011-12-01 DIAGNOSIS — R7309 Other abnormal glucose: Secondary | ICD-10-CM

## 2011-12-01 DIAGNOSIS — E669 Obesity, unspecified: Secondary | ICD-10-CM

## 2011-12-01 DIAGNOSIS — R739 Hyperglycemia, unspecified: Secondary | ICD-10-CM

## 2011-12-01 NOTE — Progress Notes (Signed)
Subjective:    Patient ID: Hannah Duran, female    DOB: 1934-04-29, 78 y.o.   MRN: [id number]  HPI Here for chronic conditions Feels ok - no new problems   bp is 130/80    Today No cp or palpitations or headaches or edema  No side effects to medicines    Wt is up 4 lb with bmi of 39 By her scales stayed the same  Diet - is doing well with diet , has given thought to weight loss  Does watch sugar in diet - stays away from sweets / stopped making desserts  Exercise- is working in the yard - mows push and bails pine straw  Is caring for her husband - and hard to find time to care for herself   Hyperglycemia is stable a1c is 6.4- borderline DM Symptoms   Lab Results  Component Value Date   CHOL 161 11/17/2010   HDL 41.40 11/17/2010   LDLCALC 99 11/17/2010   TRIG 102.0 11/17/2010   CHOLHDL 4 11/17/2010   on statin   Zoster status -not interested in a vaccine   Patient Active Problem List  Diagnoses  . ROTATOR CUFF SYNDROME, RIGHT  . Obesity  . HTN (hypertension)  . HLD (hyperlipidemia)  . Hyperglycemia  . Other screening mammogram   Past Medical History  Diagnosis Date  . Obesity   . HTN (hypertension)   . HLD (hyperlipidemia)   . Hyperglycemia    Past Surgical History  Procedure Date  . Total abdominal hysterectomy 1980's    due to menometrorrhagia  . Humerus fracture surgery 10/1997   History  Substance Use Topics  . Smoking status: Never Smoker   . Smokeless tobacco: Not on file  . Alcohol Use: No   Family History  Problem Relation Age of Onset  . Cancer Father     bladder  . Stroke Mother   . Parkinsonism Mother   . Dementia Mother   . Hypertension Mother    Allergies  Allergen Reactions  . Penicillins     REACTION: Rash  . Sulfamethoxazole W/Trimethoprim    Current Outpatient Prescriptions on File Prior to Visit  Medication Sig Dispense Refill  . atorvastatin (LIPITOR) 10 MG tablet Take 1 tablet (10 mg total) by mouth daily. Generic please  30 tablet  11  . DIOVAN HCT 80-12.5 MG per tablet TAKE ONE TABLET BY MOUTH EVERY DAY  30 each  6  . furosemide (LASIX) 20 MG tablet TAKE ONE TABLET BY MOUTH EVERY DAY  30 tablet  4  . Multiple Vitamin (MULTIVITAMIN PO) 1 tablet by mouth daily.           Review of Systems Review of Systems  Constitutional: Negative for fever, appetite change, fatigue and unexpected weight change.  Eyes: Negative for pain and visual disturbance.  Respiratory: Negative for cough and shortness of breath.   Cardiovascular: Negative for cp or palpitations    Gastrointestinal: Negative for nausea, diarrhea and constipation.  Genitourinary: Negative for urgency and frequency.  Skin: Negative for pallor or rash   Neurological: Negative for weakness, light-headedness, numbness and headaches.  Hematological: Negative for adenopathy. Does not bruise/bleed easily.  Psychiatric/Behavioral: Negative for dysphoric mood. The patient is not nervous/anxious.  does admit to severe stressors, however         Objective:   Physical Exam  Constitutional: She appears well-developed and well-nourished. No distress.  HENT:  Head: Normocephalic and atraumatic.  Mouth/Throat: Oropharynx is clear and moist.  Eyes: Conjunctivae and EOM are normal. Pupils are equal, round, and reactive to light. No scleral icterus.  Neck: Normal range of motion. Neck supple. No JVD present. Carotid bruit is not present. No thyromegaly present.  Cardiovascular: Normal rate, regular rhythm, normal heart sounds and intact distal pulses.  Exam reveals no gallop.   Pulmonary/Chest: Effort normal and breath sounds normal. No respiratory distress. She has no wheezes. She exhibits no tenderness.  Abdominal: Soft. Bowel sounds are normal. She exhibits no abdominal bruit.  Musculoskeletal: She exhibits no edema and no tenderness.  Lymphadenopathy:    She has no cervical adenopathy.  Neurological: She is alert. She has normal reflexes. No cranial nerve  deficit. She exhibits normal muscle tone. Coordination normal.  Skin: Skin is warm and dry. No rash noted. No erythema. No pallor.  Psychiatric: She has a normal mood and affect.       Cheerful and talkative Candid about stress           Assessment & Plan:

## 2011-12-01 NOTE — Assessment & Plan Note (Signed)
Discussed how this problem influences overall health and the risks it imposes  Reviewed plan for weight loss with lower calorie diet (via better food choices and also portion control or program like weight watchers) and exercise building up to or more than 30 minutes 5 days per week including some aerobic activity   For now will dec portions by 1/3 and also make better choices She thinks the exercise is reasonable F/u 6 mo

## 2011-12-01 NOTE — Assessment & Plan Note (Signed)
bp in fair control at this time  No changes needed  Disc lifstyle change with low sodium diet and exercise   

## 2011-12-01 NOTE — Patient Instructions (Signed)
Work on weight loss by burning more calories than you take in  Weight loss will help the sugar problem  Cut your portions 1/3  Also avoid sweets/ processed foods -- in general eat healthy  Also aim for at least 30 minutes of exercise 5 days per week - work up to it  Follow up in 6 months for annual exam with labs prior

## 2011-12-01 NOTE — Assessment & Plan Note (Signed)
This is stable  Had a long conv about need for weight loss  a1c is 6.4- on edge of limit for DM  No symptoms  Rev plan for low glycemic diet and wt loss

## 2011-12-09 ENCOUNTER — Ambulatory Visit (INDEPENDENT_AMBULATORY_CARE_PROVIDER_SITE_OTHER): Payer: Medicare Other | Admitting: Family Medicine

## 2011-12-09 ENCOUNTER — Encounter: Payer: Self-pay | Admitting: Family Medicine

## 2011-12-09 VITALS — BP 122/80 | HR 64 | Temp 98.2°F | Wt 248.5 lb

## 2011-12-09 DIAGNOSIS — J069 Acute upper respiratory infection, unspecified: Secondary | ICD-10-CM | POA: Insufficient documentation

## 2011-12-09 MED ORDER — BENZONATATE 100 MG PO CAPS: 100.0000 mg | ORAL_CAPSULE | Freq: Three times a day (TID) | ORAL | Status: AC | PRN

## 2011-12-09 NOTE — Progress Notes (Signed)
SUBJECTIVE:  Hannah Duran is a 78 y.o. female who complains of coryza, congestion, sneezing and dry cough for 5 days. She denies a history of anorexia, chest pain, chills, dizziness, fatigue, fevers, myalgias and shortness of breath and admits to a history of asthma. Patient denies smoke cigarettes.  Patient Active Problem List  Diagnoses  . ROTATOR CUFF SYNDROME, RIGHT  . Obesity  . HTN (hypertension)  . HLD (hyperlipidemia)  . Hyperglycemia  . Other screening mammogram  . URI (upper respiratory infection)   Past Medical History  Diagnosis Date  . Obesity   . HTN (hypertension)   . HLD (hyperlipidemia)   . Hyperglycemia    Past Surgical History  Procedure Date  . Total abdominal hysterectomy 1980's    due to menometrorrhagia  . Humerus fracture surgery 10/1997   History  Substance Use Topics  . Smoking status: Never Smoker   . Smokeless tobacco: Never Used  . Alcohol Use: No   Family History  Problem Relation Age of Onset  . Cancer Father     bladder  . Stroke Mother   . Parkinsonism Mother   . Dementia Mother   . Hypertension Mother    Allergies  Allergen Reactions  . Penicillins     REACTION: Rash  . Sulfamethoxazole W/Trimethoprim    Current Outpatient Prescriptions on File Prior to Visit  Medication Sig Dispense Refill  . atorvastatin (LIPITOR) 10 MG tablet Take 1 tablet (10 mg total) by mouth daily. Generic please   30 tablet  11  . DIOVAN HCT 80-12.5 MG per tablet TAKE ONE TABLET BY MOUTH EVERY DAY  30 each  6  . furosemide (LASIX) 20 MG tablet TAKE ONE TABLET BY MOUTH EVERY DAY  30 tablet  4  . Multiple Vitamin (MULTIVITAMIN PO) 1 tablet by mouth daily.        The PMH, PSH, Social History, Family History, Medications, and allergies have been reviewed in [facility], and have been updated if relevant.    OBJECTIVE: BP 122/80  Pulse 64  Temp(Src) 98.2 F (36.8 C) (Oral)  Wt 248 lb 8 oz (112.719 kg)  LMP 08/31/1980  She appears well, vital signs are as  noted. Ears normal.  Throat and pharynx normal.  Neck supple. No adenopathy in the neck. Nose is congested. Sinuses non tender. The chest is clear, without wheezes or rales.  ASSESSMENT:  allergic rhinitis  PLAN: Symptomatic therapy suggested: push fluids, rest and return office visit prn if symptoms persist or worsen. Lack of antibiotic effectiveness discussed with her. Call or return to clinic prn if these symptoms worsen or fail to improve as anticipated.

## 2011-12-09 NOTE — Patient Instructions (Addendum)
I think you have allergies. Also try claritin or zyrtec over the counter- two times a day as needed.  Consider using a nasal spray- either saline or we can send in a prescription for flonase. You can use warm compresses.  Cough suppressant as needed.  Call if not improving as expected in 5-7 days.

## 2011-12-29 ENCOUNTER — Other Ambulatory Visit: Payer: Self-pay | Admitting: Family Medicine

## 2012-01-20 ENCOUNTER — Emergency Department: Payer: Self-pay | Admitting: Unknown Physician Specialty

## 2012-01-20 LAB — CBC
HCT: 40.3 % (ref 35.0–47.0)
HGB: 13.2 g/dL (ref 12.0–16.0)
MCH: 29.9 pg (ref 26.0–34.0)
WBC: 14.7 10*3/uL — ABNORMAL HIGH (ref 3.6–11.0)

## 2012-01-20 LAB — URINALYSIS, COMPLETE
Blood: NEGATIVE
Glucose,UR: NEGATIVE mg/dL (ref 0–75)
Ketone: NEGATIVE
Nitrite: NEGATIVE
Ph: 6 (ref 4.5–8.0)
RBC,UR: 3 /HPF (ref 0–5)
Specific Gravity: 1.021 (ref 1.003–1.030)
Squamous Epithelial: 4
Transitional Epi: <1
WBC UR: 11 /HPF (ref 0–5)

## 2012-01-20 LAB — COMPREHENSIVE METABOLIC PANEL
Albumin: 3.5 g/dL (ref 3.4–5.0)
Anion Gap: 6 — ABNORMAL LOW (ref 7–16)
BUN: 15 mg/dL (ref 7–18)
Calcium, Total: 8.8 mg/dL (ref 8.5–10.1)
Chloride: 103 mmol/L (ref 98–107)
EGFR (Non-African Amer.): >60
Glucose: 116 mg/dL — ABNORMAL HIGH (ref 65–99)
Osmolality: 277 (ref 275–301)
Potassium: 3.8 mmol/L (ref 3.5–5.1)
SGOT(AST): 30 U/L (ref 15–37)
Sodium: 138 mmol/L (ref 136–145)
Total Protein: 7.1 g/dL (ref 6.4–8.2)

## 2012-01-20 LAB — LIPASE, BLOOD: Lipase: 85 U/L (ref 73–393)

## 2012-01-20 LAB — PROTIME-INR: INR: 1

## 2012-01-21 ENCOUNTER — Encounter: Payer: Self-pay | Admitting: Gastroenterology

## 2012-01-21 ENCOUNTER — Telehealth: Payer: Self-pay | Admitting: Gastroenterology

## 2012-01-21 NOTE — Telephone Encounter (Signed)
Notified Marion at [facility] of appt in am with Mike Gip, PA 01/22/12 at 10am. appt is still in for Dr Jarold Motto. Faxed a request to [facility] med rec for ER records.

## 2012-01-22 ENCOUNTER — Ambulatory Visit: Payer: Self-pay | Admitting: Gastroenterology

## 2012-01-22 ENCOUNTER — Ambulatory Visit: Payer: Medicare Other | Admitting: Physician Assistant

## 2012-01-22 LAB — CREATININE, SERUM
Creatinine: 0.77 mg/dL (ref 0.60–1.30)
EGFR (African American): >60
EGFR (Non-African Amer.): >60

## 2012-01-26 LAB — PATHOLOGY REPORT

## 2012-02-03 ENCOUNTER — Encounter: Payer: Self-pay | Admitting: Family Medicine

## 2012-02-16 ENCOUNTER — Ambulatory Visit: Payer: Medicare Other | Admitting: Gastroenterology

## 2012-02-29 DIAGNOSIS — K559 Vascular disorder of intestine, unspecified: Secondary | ICD-10-CM

## 2012-02-29 HISTORY — DX: Vascular disorder of intestine, unspecified: K55.9

## 2012-03-02 ENCOUNTER — Encounter: Payer: Self-pay | Admitting: Family Medicine

## 2012-04-05 ENCOUNTER — Other Ambulatory Visit: Payer: Self-pay | Admitting: Family Medicine

## 2012-05-11 ENCOUNTER — Other Ambulatory Visit: Payer: Self-pay | Admitting: Family Medicine

## 2012-05-11 NOTE — Telephone Encounter (Signed)
Request for Valsartan-hydrochlorothiazide 80-12.5 mg #30 6R. Refill sent electronic to [facility] pharmacy.

## 2012-05-18 ENCOUNTER — Ambulatory Visit: Payer: Medicare Other

## 2012-05-18 ENCOUNTER — Ambulatory Visit (INDEPENDENT_AMBULATORY_CARE_PROVIDER_SITE_OTHER): Payer: Medicare Other

## 2012-05-18 DIAGNOSIS — Z23 Encounter for immunization: Secondary | ICD-10-CM

## 2012-05-26 ENCOUNTER — Telehealth: Payer: Self-pay | Admitting: Family Medicine

## 2012-05-26 DIAGNOSIS — E785 Hyperlipidemia, unspecified: Secondary | ICD-10-CM

## 2012-05-26 DIAGNOSIS — I10 Essential (primary) hypertension: Secondary | ICD-10-CM

## 2012-05-26 DIAGNOSIS — R739 Hyperglycemia, unspecified: Secondary | ICD-10-CM

## 2012-05-26 NOTE — Telephone Encounter (Signed)
Message copied by Judy Pimple on Thu May 26, 2012  7:06 PM ------      Message from: Alvina Chou      Created: Fri May 20, 2012 10:19 AM      Regarding: Lab orders for Friday 9-27       Patient is scheduled for CPX labs, please order future labs, Thanks , Camelia Eng

## 2012-05-27 ENCOUNTER — Other Ambulatory Visit (INDEPENDENT_AMBULATORY_CARE_PROVIDER_SITE_OTHER): Payer: Medicare Other

## 2012-05-27 DIAGNOSIS — R739 Hyperglycemia, unspecified: Secondary | ICD-10-CM

## 2012-05-27 DIAGNOSIS — E785 Hyperlipidemia, unspecified: Secondary | ICD-10-CM

## 2012-05-27 DIAGNOSIS — R7309 Other abnormal glucose: Secondary | ICD-10-CM

## 2012-05-27 DIAGNOSIS — I10 Essential (primary) hypertension: Secondary | ICD-10-CM

## 2012-05-27 LAB — CBC WITH DIFFERENTIAL/PLATELET
Basophils Relative: 0.9 % (ref 0.0–3.0)
Eosinophils Absolute: 0.2 10*3/uL (ref 0.0–0.7)
MCHC: 32.5 g/dL (ref 30.0–36.0)
MCV: 92.7 fl (ref 78.0–100.0)
Monocytes Absolute: 0.7 10*3/uL (ref 0.1–1.0)
Neutrophils Relative %: 54.8 % (ref 43.0–77.0)
Platelets: 231 10*3/uL (ref 150.0–400.0)
RBC: 4.67 Mil/uL (ref 3.87–5.11)

## 2012-05-27 LAB — LIPID PANEL
Cholesterol: 152 mg/dL (ref 0–200)
HDL: 38.3 mg/dL — ABNORMAL LOW (ref >39.00)
LDL Cholesterol: 89 mg/dL (ref 0–99)
Triglycerides: 126 mg/dL (ref 0.0–149.0)
VLDL: 25.2 mg/dL (ref 0.0–40.0)

## 2012-05-27 LAB — COMPREHENSIVE METABOLIC PANEL
AST: 20 U/L (ref 0–37)
Albumin: 3.9 g/dL (ref 3.5–5.2)
Alkaline Phosphatase: 80 U/L (ref 39–117)
BUN: 23 mg/dL (ref 6–23)
Potassium: 3.8 mEq/L (ref 3.5–5.1)
Total Bilirubin: 1 mg/dL (ref 0.3–1.2)

## 2012-06-01 ENCOUNTER — Ambulatory Visit (INDEPENDENT_AMBULATORY_CARE_PROVIDER_SITE_OTHER): Payer: Medicare Other | Admitting: Family Medicine

## 2012-06-01 ENCOUNTER — Encounter: Payer: Self-pay | Admitting: Family Medicine

## 2012-06-01 VITALS — BP 128/73 | HR 60 | Temp 98.3°F | Ht 65.5 in | Wt 244.2 lb

## 2012-06-01 DIAGNOSIS — R5381 Other malaise: Secondary | ICD-10-CM

## 2012-06-01 DIAGNOSIS — Z1231 Encounter for screening mammogram for malignant neoplasm of breast: Secondary | ICD-10-CM

## 2012-06-01 DIAGNOSIS — R5383 Other fatigue: Secondary | ICD-10-CM

## 2012-06-01 DIAGNOSIS — E669 Obesity, unspecified: Secondary | ICD-10-CM

## 2012-06-01 DIAGNOSIS — R739 Hyperglycemia, unspecified: Secondary | ICD-10-CM

## 2012-06-01 DIAGNOSIS — R7309 Other abnormal glucose: Secondary | ICD-10-CM

## 2012-06-01 DIAGNOSIS — I10 Essential (primary) hypertension: Secondary | ICD-10-CM

## 2012-06-01 DIAGNOSIS — E785 Hyperlipidemia, unspecified: Secondary | ICD-10-CM

## 2012-06-01 NOTE — Assessment & Plan Note (Signed)
a1c stable at 6.4 Disc imp of wt loss and low glycemic diet

## 2012-06-01 NOTE — Assessment & Plan Note (Signed)
bp is stable today  No cp or palpitations or headaches or edema  No side effects to medicines  BP Readings from Last 3 Encounters:  06/01/12 128/73  12/09/11 122/80  12/01/11 130/80    bp in fair control at this time  No changes needed  Disc lifstyle change with low sodium diet and exercise

## 2012-06-01 NOTE — Progress Notes (Signed)
Subjective:    Patient ID: Hannah Duran, female    DOB: July 22, 1934, 78 y.o.   MRN: [id number]  HPI Here for check up of chronic medical conditions and to review health mt list   Feeling good overall   Husband with dementia - so having emotional issues with that - she has trouble with irritability  He is on medication now  Is doing some better   Wt is down 4 lb with bmi of 40 Is trying to eat less in general and avoiding sweets and desserts   Hyperglycemia a1c is 6.4- stable / borderline DM She does walk some - but is very tired taking care of her husband- chronically exhausted   mammo 5/12-needs to schedule that at [facility] Self exam- no lumps or changes   Had a hyst-- no problems gyn at all   colonosc 5/13  Declines zostavax  utd other imms   Hyperlipidemia Lab Results  Component Value Date   CHOL 152 05/27/2012   CHOL 161 11/17/2010   CHOL 153 09/17/2009   Lab Results  Component Value Date   HDL 38.30* 05/27/2012   HDL 41.40 11/17/2010   HDL 13.08 09/17/2009   Lab Results  Component Value Date   LDLCALC 89 05/27/2012   LDLCALC 99 11/17/2010   LDLCALC 85 09/17/2009   Lab Results  Component Value Date   TRIG 126.0 05/27/2012   TRIG 102.0 11/17/2010   TRIG 145.0 09/17/2009   Lab Results  Component Value Date   CHOLHDL 4 05/27/2012   CHOLHDL 4 11/17/2010   CHOLHDL 4 09/17/2009   No results found for this basename: LDLDIRECT  a pretty good profile- just want to get HDL higher    bp is stable today  No cp or palpitations or headaches or edema  No side effects to medicines  BP Readings from Last 3 Encounters:  06/01/12 128/73  12/09/11 122/80  12/01/11 130/80     Patient Active Problem List  Diagnosis  . ROTATOR CUFF SYNDROME, RIGHT  . Obesity  . HTN (hypertension)  . HLD (hyperlipidemia)  . Hyperglycemia  . Other screening mammogram   Past Medical History  Diagnosis Date  . Obesity   . HTN (hypertension)   . HLD (hyperlipidemia)   . Hyperglycemia   .  Ischemic colitis 7/13    on colonoscopy   Past Surgical History  Procedure Date  . Total abdominal hysterectomy 1980's    due to menometrorrhagia  . Humerus fracture surgery 10/1997   History  Substance Use Topics  . Smoking status: Never Smoker   . Smokeless tobacco: Never Used  . Alcohol Use: No   Family History  Problem Relation Age of Onset  . Cancer Father     bladder  . Stroke Mother   . Parkinsonism Mother   . Dementia Mother   . Hypertension Mother    Allergies  Allergen Reactions  . Penicillins     REACTION: Rash  . Sulfamethoxazole W-Trimethoprim    Current Outpatient Prescriptions on File Prior to Visit  Medication Sig Dispense Refill  . atorvastatin (LIPITOR) 10 MG tablet TAKE ONE TABLET BY MOUTH EVERY DAY  30 tablet  5  . furosemide (LASIX) 20 MG tablet TAKE ONE TABLET BY MOUTH EVERY DAY  30 tablet  6  . valsartan-hydrochlorothiazide (DIOVAN-HCT) 80-12.5 MG per tablet TAKE ONE TABLET BY MOUTH EVERY DAY  30 tablet  6  . DISCONTD: atorvastatin (LIPITOR) 10 MG tablet Take 1 tablet (10 mg total) by  mouth daily. Generic please   30 tablet  11  . Multiple Vitamin (MULTIVITAMIN PO) 1 tablet by mouth daily.           Review of Systems Review of Systems  Constitutional: Negative for fever, appetite change, fatigue and unexpected weight change.  Eyes: Negative for pain and visual disturbance.  Respiratory: Negative for cough and shortness of breath.   Cardiovascular: Negative for cp or palpitations    Gastrointestinal: Negative for nausea, diarrhea and constipation.  Genitourinary: Negative for urgency and frequency.  Skin: Negative for pallor or rash   Neurological: Negative for weakness, light-headedness, numbness and headaches.  Hematological: Negative for adenopathy. Does not bruise/bleed easily.  Psychiatric/Behavioral: Negative for dysphoric mood. Some anxiety and irritability from stressors       Objective:   Physical Exam  Constitutional: She  appears well-developed and well-nourished. No distress.  HENT:  Head: Normocephalic and atraumatic.  Right Ear: External ear normal.  Left Ear: External ear normal.  Nose: Nose normal.  Mouth/Throat: Oropharynx is clear and moist.  Eyes: Conjunctivae normal and EOM are normal. Pupils are equal, round, and reactive to light. Right eye exhibits no discharge. Left eye exhibits no discharge. No scleral icterus.  Neck: Normal range of motion. Neck supple. No JVD present. Carotid bruit is not present. No thyromegaly present.  Cardiovascular: Normal rate, regular rhythm, normal heart sounds and intact distal pulses.  Exam reveals no gallop.   Pulmonary/Chest: Effort normal and breath sounds normal. No respiratory distress. She has no wheezes.  Abdominal: Soft. Bowel sounds are normal. She exhibits no distension, no abdominal bruit and no mass. There is no tenderness.  Genitourinary: No breast swelling, tenderness, discharge or bleeding.       Breast exam: No mass, nodules, thickening, tenderness, bulging, retraction, inflamation, nipple discharge or skin changes noted.  No axillary or clavicular LA.  Chaperoned exam.    Musculoskeletal: Normal range of motion. She exhibits no edema and no tenderness.  Lymphadenopathy:    She has no cervical adenopathy.  Neurological: She is alert. She has normal reflexes. No cranial nerve deficit. She exhibits normal muscle tone. Coordination normal.  Skin: Skin is warm and dry. No rash noted. No erythema. No pallor.  Psychiatric: She has a normal mood and affect.          Assessment & Plan:

## 2012-06-01 NOTE — Assessment & Plan Note (Signed)
Discussed how this problem influences overall health and the risks it imposes  Reviewed plan for weight loss with lower calorie diet (via better food choices and also portion control or program like weight watchers) and exercise building up to or more than 30 minutes 5 days per week including some aerobic activity   she states she does not have enough time to spend on her health for this

## 2012-06-01 NOTE — Assessment & Plan Note (Signed)
Scheduled annual screening mammogram Nl breast exam today  Encouraged monthly self exams   

## 2012-06-01 NOTE — Assessment & Plan Note (Signed)
Husband with dementia and copd  This is very hard on her but she thinks she is doing ok - enc her to get help  Needs time to take care of herself Also enc counseling in future to work on Pharmacologist

## 2012-06-01 NOTE — Assessment & Plan Note (Signed)
Disc goals for lipids and reasons to control them Rev labs with pt Rev low sat fat diet in detail Overall stable on lipitor Needs more exercise to raise HDL

## 2012-06-01 NOTE — Patient Instructions (Addendum)
Stop at check out for your mammogram referral Please work hard on diet and exercise for weight loss- and importantly- low sugar diet Walk / exercise when you can  Follow up 6 months with labs prior

## 2012-06-21 ENCOUNTER — Ambulatory Visit (INDEPENDENT_AMBULATORY_CARE_PROVIDER_SITE_OTHER): Payer: Medicare Other | Admitting: Family Medicine

## 2012-06-21 ENCOUNTER — Encounter: Payer: Self-pay | Admitting: Family Medicine

## 2012-06-21 VITALS — BP 162/78 | HR 64 | Temp 97.7°F | Ht 65.5 in | Wt 248.5 lb

## 2012-06-21 DIAGNOSIS — K648 Other hemorrhoids: Secondary | ICD-10-CM

## 2012-06-21 DIAGNOSIS — B373 Candidiasis of vulva and vagina: Secondary | ICD-10-CM | POA: Insufficient documentation

## 2012-06-21 MED ORDER — HYDROCORTISONE ACETATE 25 MG RE SUPP: 25.0000 mg | Freq: Two times a day (BID) | RECTAL | Status: DC

## 2012-06-21 MED ORDER — FLUCONAZOLE 150 MG PO TABS: 150.0000 mg | ORAL_TABLET | Freq: Once | ORAL | Status: DC

## 2012-06-21 NOTE — Patient Instructions (Addendum)
Use the rectal suppositories for hemorrhoids as directed Try not to strain- that makes hemorrhoids worse  If no improvement or if worse- update me  Take the diflucan pill for yeast infection one time orally Get back on your blood pressure medicine

## 2012-06-21 NOTE — Progress Notes (Signed)
Subjective:    Patient ID: Hannah Duran, female    DOB: October 17, 1933, 78 y.o.   MRN: [id number]  HPI Having trouble with her hemorrhoids  Thinks they are internal  Painful but not bleeding   Prep H suppositories- not helping a lot  No straining or constipation lately No lifting  Some sitting  Using heat  Has had this happen before  Never had hemorroid surgery  colonosc was in May- all was ok   Also thinks she has a vaginal yeast infection Redness Itching  One side on the left  Not painful- just itching  Used some neosporin -no help No yeast infection meds  No chance of STDS -not currently sexually active Had odor and discharge - that is improved  No pelvic pain   Patient Active Problem List  Diagnosis  . ROTATOR CUFF SYNDROME, RIGHT  . Obesity  . HTN (hypertension)  . HLD (hyperlipidemia)  . Hyperglycemia  . Other screening mammogram  . Caregiver with fatigue   Past Medical History  Diagnosis Date  . Obesity   . HTN (hypertension)   . HLD (hyperlipidemia)   . Hyperglycemia   . Ischemic colitis 7/13    on colonoscopy   Past Surgical History  Procedure Date  . Total abdominal hysterectomy 1980's    due to menometrorrhagia  . Humerus fracture surgery 10/1997   History  Substance Use Topics  . Smoking status: Never Smoker   . Smokeless tobacco: Never Used  . Alcohol Use: No   Family History  Problem Relation Age of Onset  . Cancer Father     bladder  . Stroke Mother   . Parkinsonism Mother   . Dementia Mother   . Hypertension Mother    Allergies  Allergen Reactions  . Penicillins     REACTION: Rash  . Sulfamethoxazole W-Trimethoprim    Current Outpatient Prescriptions on File Prior to Visit  Medication Sig Dispense Refill  . atorvastatin (LIPITOR) 10 MG tablet TAKE ONE TABLET BY MOUTH EVERY DAY  30 tablet  5  . furosemide (LASIX) 20 MG tablet TAKE ONE TABLET BY MOUTH EVERY DAY  30 tablet  6  . valsartan-hydrochlorothiazide (DIOVAN-HCT) 80-12.5  MG per tablet TAKE ONE TABLET BY MOUTH EVERY DAY  30 tablet  6     No gyn probs in the past     Review of Systems Review of Systems  Constitutional: Negative for fever, appetite change, fatigue and unexpected weight change. pos for obesity  Eyes: Negative for pain and visual disturbance.  Respiratory: Negative for cough and shortness of breath.   Cardiovascular: Negative for cp or palpitations    Gastrointestinal: Negative for nausea, diarrhea and constipation.pos for painful hemorrhoids   Genitourinary: Negative for urgency and frequency. pos for itching and redness of vulva , had discharge that is better now  Skin: Negative for pallor and pos for redness/ rash (vaginal) Neurological: Negative for weakness, light-headedness, numbness and headaches.  Hematological: Negative for adenopathy. Does not bruise/bleed easily.  Psychiatric/Behavioral: Negative for dysphoric mood. The patient is not nervous/anxious.         Objective:   Physical Exam  Constitutional: She appears well-developed and well-nourished. No distress.  HENT:  Head: Normocephalic and atraumatic.  Eyes: Conjunctivae normal and EOM are normal. Pupils are equal, round, and reactive to light. Right eye exhibits no discharge. Left eye exhibits no discharge. No scleral icterus.  Neck: Normal range of motion. Neck supple. No JVD present. Carotid bruit is not present.  No thyromegaly present.  Cardiovascular: Normal rate, regular rhythm and normal heart sounds.   Pulmonary/Chest: Effort normal and breath sounds normal. No respiratory distress. She has no wheezes.  Abdominal: Soft. Bowel sounds are normal. She exhibits no distension. There is no tenderness.       No suprapubic tenderness or fullness    Genitourinary: Rectal exam shows no external hemorrhoid, no fissure, no mass and no tenderness. Guaiac positive stool. There is rash on the right labia. There is no tenderness or lesion on the right labia. There is rash on the  left labia. There is no tenderness or lesion on the left labia. There is erythema around the vagina. No tenderness around the vagina. Vaginal discharge found.       Scant vaginal discharge with hyperemia of labia minora   Lymphadenopathy:    She has no cervical adenopathy.  Neurological: She is alert. She has normal reflexes.  Skin: Skin is warm and dry. No rash noted. There is erythema. No pallor.  Psychiatric: She has a normal mood and affect.          Assessment & Plan:

## 2012-06-23 LAB — POCT WET PREP (WET MOUNT): KOH Wet Prep POC: POSITIVE

## 2012-06-23 NOTE — Assessment & Plan Note (Signed)
Mild with trace guiac pos stool  Non thrombosed Will tx with anusol hc suppositories and update  Warned to avoid straining  Update if not starting to improve in a week or if worsening

## 2012-06-23 NOTE — Assessment & Plan Note (Signed)
Treat with diflucan and update  Adv she could also use external monistat otc  Update if not starting to improve in a week or if worsening

## 2012-06-27 ENCOUNTER — Ambulatory Visit (INDEPENDENT_AMBULATORY_CARE_PROVIDER_SITE_OTHER): Payer: Medicare Other | Admitting: Family Medicine

## 2012-06-27 ENCOUNTER — Encounter: Payer: Self-pay | Admitting: Family Medicine

## 2012-06-27 VITALS — BP 146/76 | HR 84 | Temp 97.5°F | Ht 65.5 in | Wt 245.5 lb

## 2012-06-27 DIAGNOSIS — K6289 Other specified diseases of anus and rectum: Secondary | ICD-10-CM

## 2012-06-27 NOTE — Patient Instructions (Addendum)
Stop up front on the way out to see Hannah Duran about your GI referral  Sit on a donut shaped pillow if possible  Do not use any more suppositories since they are not helping

## 2012-06-27 NOTE — Progress Notes (Signed)
Subjective:    Patient ID: Hannah Duran, female    DOB: Feb 24, 1934, 78 y.o.   MRN: [id number]  HPI Using the anusol ac suppository - no improvement at all (yeast infx gone, however)  Pain in rectum- sometimes goes over into side of the buttocks- both sides No swelling  No bleeding  No discharge  BMs are fine Does not hurt to go- in fact feels good to sit on commode Feels bad to sit in a chair   No straining No constipation No pushing  Her last colonoscopy was within the past few years \ It turns out she saw Dr Hannah Duran in the past for this -colonosc and CT scan and no reason found   Patient Active Problem List  Diagnosis  . ROTATOR CUFF SYNDROME, RIGHT  . Obesity  . HTN (hypertension)  . HLD (hyperlipidemia)  . Hyperglycemia  . Other screening mammogram  . Caregiver with fatigue  . Internal hemorrhoid  . Yeast vaginitis   Past Medical History  Diagnosis Date  . Obesity   . HTN (hypertension)   . HLD (hyperlipidemia)   . Hyperglycemia   . Ischemic colitis 7/13    on colonoscopy   Past Surgical History  Procedure Date  . Total abdominal hysterectomy 1980's    due to menometrorrhagia  . Humerus fracture surgery 10/1997   History  Substance Use Topics  . Smoking status: Never Smoker   . Smokeless tobacco: Never Used  . Alcohol Use: No   Family History  Problem Relation Age of Onset  . Cancer Father     bladder  . Stroke Mother   . Parkinsonism Mother   . Dementia Mother   . Hypertension Mother    Allergies  Allergen Reactions  . Penicillins     REACTION: Rash  . Sulfamethoxazole W-Trimethoprim    Current Outpatient Prescriptions on File Prior to Visit  Medication Sig Dispense Refill  . atorvastatin (LIPITOR) 10 MG tablet TAKE ONE TABLET BY MOUTH EVERY DAY  30 tablet  5  . furosemide (LASIX) 20 MG tablet TAKE ONE TABLET BY MOUTH EVERY DAY  30 tablet  6  . hydrocortisone (ANUSOL-HC) 25 MG suppository Place 1 suppository (25 mg total) rectally 2  (two) times daily.  14 suppository  0  . valsartan-hydrochlorothiazide (DIOVAN-HCT) 80-12.5 MG per tablet TAKE ONE TABLET BY MOUTH EVERY DAY  30 tablet  6      Review of Systems Review of Systems  Constitutional: Negative for fever, appetite change, fatigue and unexpected weight change.  Eyes: Negative for pain and visual disturbance.  Respiratory: Negative for cough and shortness of breath.   Cardiovascular: Negative for cp or palpitations    Gastrointestinal: Negative for nausea, diarrhea and constipation. Pos for rectal/ buttock pain   Genitourinary: Negative for urgency and frequency.  Skin: Negative for pallor or rash   Neurological: Negative for weakness, light-headedness, numbness and headaches.  Hematological: Negative for adenopathy. Does not bruise/bleed easily.  Psychiatric/Behavioral: Negative for dysphoric mood. The patient is not nervous/anxious.         Objective:   Physical Exam  Constitutional: She appears well-developed and well-nourished. No distress.  HENT:  Head: Normocephalic and atraumatic.  Mouth/Throat: Oropharynx is clear and moist.  Eyes: Conjunctivae normal and EOM are normal. Pupils are equal, round, and reactive to light.  Neck: Normal range of motion. Neck supple. No thyromegaly present.  Cardiovascular: Normal rate, regular rhythm and intact distal pulses.   Pulmonary/Chest: Effort normal and breath sounds  normal.  Abdominal: Soft. Bowel sounds are normal. She exhibits no distension and no mass. There is no tenderness. There is no rebound and no guarding.  Genitourinary: Rectal exam shows no external hemorrhoid, no fissure, no mass, no tenderness and anal tone normal.       Anoscopy- some hyperemic mucosa Small int hemorrhoid at 3:00-non thrombosed Heme neg stool  Musculoskeletal: She exhibits no edema.  Lymphadenopathy:    She has no cervical adenopathy.  Neurological: She is alert. She has normal reflexes. She exhibits normal muscle tone.  Coordination normal.  Skin: Skin is warm and dry. No rash noted. No erythema. No pallor.  Psychiatric: She has a normal mood and affect.          Assessment & Plan:

## 2012-06-29 ENCOUNTER — Encounter: Payer: Self-pay | Admitting: Internal Medicine

## 2012-06-29 ENCOUNTER — Telehealth: Payer: Self-pay | Admitting: Internal Medicine

## 2012-06-29 NOTE — Telephone Encounter (Signed)
Okay for new patient slot.

## 2012-06-29 NOTE — Telephone Encounter (Signed)
Appointment Scheduled w-[facility] for pt on 07-22-12/yf

## 2012-07-12 ENCOUNTER — Encounter: Payer: Self-pay | Admitting: Internal Medicine

## 2012-07-13 ENCOUNTER — Ambulatory Visit (INDEPENDENT_AMBULATORY_CARE_PROVIDER_SITE_OTHER): Payer: Medicare Other | Admitting: Internal Medicine

## 2012-07-13 ENCOUNTER — Encounter: Payer: Self-pay | Admitting: Internal Medicine

## 2012-07-13 VITALS — BP 152/78 | HR 72 | Ht 65.5 in | Wt 247.0 lb

## 2012-07-13 DIAGNOSIS — K6289 Other specified diseases of anus and rectum: Secondary | ICD-10-CM

## 2012-07-13 DIAGNOSIS — M7918 Myalgia, other site: Secondary | ICD-10-CM

## 2012-07-13 DIAGNOSIS — K559 Vascular disorder of intestine, unspecified: Secondary | ICD-10-CM

## 2012-07-13 DIAGNOSIS — IMO0001 Reserved for inherently not codable concepts without codable children: Secondary | ICD-10-CM

## 2012-07-13 NOTE — Progress Notes (Signed)
Patient ID: Hannah Duran, female   DOB: 10-Apr-1934, 78 y.o.   MRN: [id number]  SUBJECTIVE: HPI Hannah Duran is a 78 year old female with a past medical history of hypertension, hyperlipidemia, hyperglycemia, obesity, ischemic colitis in May of 2013, and internal hemorrhoids documented by colonoscopy who seen in consultation at the request of Dr. Milinda Antis for evaluation of anorectal pain. The patient was previously seen in [place], [place] for her GI care. She developed what was determined to be ischemic colitis in may 2013 and was hospitalized briefly. Her manifestations were rectal bleeding, but not pain. She had colonoscopy on 01/22/2012 by Dr. Barnetta Chapel, MD which revealed mucosal ulceration described as-R./friable ulcerated mucosa with stigmata of recent bleeding present 30-50 cm from the anus. It goes from 50-60 cm and from 30 cm distal to the anal verge was normal. Scope was not advanced beyond 60 cm due to severe nature of affected prep was good. There was mild small mouth diverticulosis in the sigmoid. Nonbleeding internal hemorrhoids were found. She was placed on a liquid diet and plan for repeat colonoscopy in July 2013. Her bleeding resolved and she prepped his colonoscopy, but when she presented for the test it was not done due to the unavailability of anesthesia. He has not returned to this practice. The patient reports over last 3-4 weeks developing anal pain and pain in her buttocks bilaterally. This pain is not related to bowel movement and she's had no rectal bleeding. She has no abdominal pain. She has no pain with passing stools and her stools have been formed. She reports it hurts to sit and she was sitting on a cushion to help with the pain. At times this pain was quite severe, and somewhat relieved with Tylenol. She also was using ice.  The pain over the last several days has significantly improved and today in fact gone but the pain greatly impacted her over the last month. She  denies melena. No fevers or chills. Good appetite. No nausea or vomiting.  Rectal exam and anoscopy was performed on 06/27/2012 by Dr. Milinda Antis and on review this no revealed no external hemorrhoids, no fissures, no masses, heme-negative stool, mild erythematous mucosa in the distal rectum and small nonthrombosed internal hemorrhoids. The patient recalls this exam was not painful  Review of Systems  As per history of present illness, otherwise negative   Past Medical History  Diagnosis Date  . Obesity   . HTN (hypertension)   . HLD (hyperlipidemia)   . Hyperglycemia   . Ischemic colitis 7/13    on colonoscopy  . Congenital foot deformity   . Dermatophytosis   . Rotator cuff syndrome     Current Outpatient Prescriptions  Medication Sig Dispense Refill  . atorvastatin (LIPITOR) 10 MG tablet TAKE ONE TABLET BY MOUTH EVERY DAY  30 tablet  5  . furosemide (LASIX) 20 MG tablet TAKE ONE TABLET BY MOUTH EVERY DAY  30 tablet  6  . ibuprofen (ADVIL,MOTRIN) 200 MG tablet Take 200 mg by mouth as needed.      . valsartan-hydrochlorothiazide (DIOVAN-HCT) 80-12.5 MG per tablet TAKE ONE TABLET BY MOUTH EVERY DAY  30 tablet  6    Allergies  Allergen Reactions  . Penicillins     REACTION: Rash  . Sulfamethoxazole W-Trimethoprim     Family History  Problem Relation Age of Onset  . Cancer Father     bladder  . Stroke Mother   . Parkinsonism Mother   . Dementia Mother   .  Hypertension Mother     History  Substance Use Topics  . Smoking status: Never Smoker   . Smokeless tobacco: Never Used  . Alcohol Use: No    OBJECTIVE: BP 152/78  Pulse 72  Ht 5' 5.5" (1.664 m)  Wt 247 lb (112.038 kg)  BMI 40.48 kg/m2  LMP 08/31/1980 Constitutional: Well-developed and well-nourished. No distress. HEENT: Normocephalic and atraumatic. Oropharynx is clear and moist. No oropharyngeal exudate. Conjunctivae are normal. No scleral icterus. Neck: Neck supple. Trachea midline. Cardiovascular: Normal  rate, regular rhythm and intact distal pulses. No M/R/G Pulmonary/chest: Effort normal and breath sounds normal. No wheezing, rales or rhonchi. Abdominal: Soft, nontender, obese, nondistended. Bowel sounds active throughout. There are no masses palpable.  Extremities: no clubbing, cyanosis, or edema Neurological: Alert and oriented to person place and time. Skin: Skin is warm and dry. No rashes noted. Psychiatric: Normal mood and affect. Behavior is normal.  Labs and Imaging -- CBC    Component Value Date/Time   WBC 6.9 05/27/2012 0933   RBC 4.67 05/27/2012 0933   HGB 14.1 05/27/2012 0933   HCT 43.3 05/27/2012 0933   PLT 231.0 05/27/2012 0933   MCV 92.7 05/27/2012 0933   MCHC 32.5 05/27/2012 0933   RDW 13.9 05/27/2012 0933   LYMPHSABS 2.1 05/27/2012 0933   MONOABS 0.7 05/27/2012 0933   EOSABS 0.2 05/27/2012 0933   BASOSABS 0.1 05/27/2012 0933    CMP     Component Value Date/Time   NA 138 05/27/2012 0933   K 3.8 05/27/2012 0933   CL 100 05/27/2012 0933   CO2 30 05/27/2012 0933   GLUCOSE 117* 05/27/2012 0933   BUN 23 05/27/2012 0933   CREATININE 0.9 05/27/2012 0933   CALCIUM 9.8 05/27/2012 0933   PROT 7.0 05/27/2012 0933   ALBUMIN 3.9 05/27/2012 0933   AST 20 05/27/2012 0933   ALT 18 05/27/2012 0933   ALKPHOS 80 05/27/2012 0933   BILITOT 1.0 05/27/2012 0933   GFRNONAA 74.64 09/17/2009 1206   GFRAA 106 08/16/2008 0918   TSH - normal  Pathology from colonoscopy reviewed and revealed focal and mild active colitis, negative for dysplasia or malignancy. Consistent with ischemic colitis. Also one segment fibrinopurulent material only no epithelium.  ASSESSMENT AND PLAN: 78 year old female with a past medical history of hypertension, hyperlipidemia, hyperglycemia, obesity, ischemic colitis in May of 2013, and internal hemorrhoids documented by colonoscopy who seen in consultation at the request of Dr. Milinda Antis for evaluation of anorectal pain.   1.  Anorectal pain/bilateral buttock pain -- the patient  had a documented episode of ischemic colitis in May 2013 and symptomatically this resolved. She has had significant anal and bilateral buttocks pain over last 3-4 weeks, but no hemorrhoidal symptoms and only small internal hemorrhoids seen at anoscopy. She has no painful defecation, change in bowel pattern, rectal bleeding.  I wonder if she has developed a peri-rectal abscess and have recommended pelvic MRI to better assess her symptoms. At this point I do not repeat colonoscopy will provide additional information. Given that her last colonoscopy was incomplete, we will consider repeat colonoscopy at a later time. She can continue to use Tylenol up to 4 g daily as needed for anorectal pain. Further recommendations after imaging.

## 2012-07-13 NOTE — Patient Instructions (Addendum)
You have been scheduled for an MRI at [facility] on 07/25/2012 At 12:00   Please arrive 15 min. Prior to your appointment.  If you cannot make this appointment please call  [phone] to reschedule.

## 2012-07-19 ENCOUNTER — Telehealth: Payer: Self-pay | Admitting: *Deleted

## 2012-07-19 NOTE — Telephone Encounter (Signed)
Sent am URGENT RESTART request to [facility] for MRI denial; fax [phone].

## 2012-07-19 NOTE — Telephone Encounter (Signed)
The Urgent Restart was not accepted and was denied; faxed an appeal to [employer] for [facility] Medicare to [phone].

## 2012-07-22 ENCOUNTER — Ambulatory Visit: Payer: Medicare Other | Admitting: Internal Medicine

## 2012-07-25 ENCOUNTER — Ambulatory Visit (HOSPITAL_COMMUNITY)
Admission: RE | Admit: 2012-07-25 | Discharge: 2012-07-25 | Disposition: A | Discharge status: Home / Self Care | Payer: Medicare Other | Source: Ambulatory Visit | Attending: Internal Medicine | Admitting: Internal Medicine

## 2012-07-25 DIAGNOSIS — K6289 Other specified diseases of anus and rectum: Secondary | ICD-10-CM | POA: Insufficient documentation

## 2012-07-25 MED ORDER — GADOBENATE DIMEGLUMINE 529 MG/ML IV SOLN
20.0000 mL | Freq: Once | INTRAVENOUS | Status: AC | PRN
  Administered 2012-07-25: 20 mL via INTRAVENOUS

## 2012-07-26 LAB — POCT I-STAT, CHEM 8
Chloride: 104 mEq/L (ref 96–112)
Glucose, Bld: 90 mg/dL (ref 70–99)
HCT: 39 % (ref 36.0–46.0)
Hemoglobin: 13.3 g/dL (ref 12.0–15.0)
Potassium: 3.8 mEq/L (ref 3.5–5.1)

## 2012-07-27 ENCOUNTER — Other Ambulatory Visit: Payer: Self-pay | Admitting: *Deleted

## 2012-07-27 ENCOUNTER — Telehealth: Payer: Self-pay | Admitting: Internal Medicine

## 2012-07-27 MED ORDER — TRAMADOL HCL 50 MG PO TABS: ORAL_TABLET | ORAL | Status: DC

## 2012-07-27 NOTE — Telephone Encounter (Signed)
Informed pt; see MRI result note.

## 2012-08-08 NOTE — Telephone Encounter (Signed)
Procedure has been done

## 2012-09-12 ENCOUNTER — Telehealth: Payer: Self-pay | Admitting: *Deleted

## 2012-09-12 NOTE — Telephone Encounter (Signed)
Message copied by Florene Glen on Mon Sep 12, 2012  8:20 AM ------      Message from: Graciella Freer K      Created: Wed Jul 27, 2012  3:04 PM       Call pt to schedule colon

## 2012-09-12 NOTE — Telephone Encounter (Signed)
Per MRI result note, call pt after the 1st of the year to schedule a COLON. I will call back at the end of the month; she has to check on transportation. Reminder in.

## 2012-10-03 ENCOUNTER — Telehealth: Payer: Self-pay | Admitting: *Deleted

## 2012-10-03 NOTE — Telephone Encounter (Signed)
Pt called back to report her husband is in the hospital with COPD, Dementia and Sundowner's for the past 2 nights. She reports she can't plan a COLON at this time. Informed her I will call her in a couple of months; she stated understanding.

## 2012-10-03 NOTE — Telephone Encounter (Signed)
Message copied by Florene Glen on Mon Oct 03, 2012  8:16 AM ------      Message from: Florene Glen      Created: Mon Sep 12, 2012  8:23 AM       Per MRI result note, call pt after the 1st of the year to schedule a COLON. I will call back at the end of the month; she has to check on transportation. Reminder in.

## 2012-10-03 NOTE — Telephone Encounter (Signed)
lmom for pt to call back

## 2012-11-07 ENCOUNTER — Telehealth: Payer: Self-pay | Admitting: Family Medicine

## 2012-11-07 ENCOUNTER — Ambulatory Visit (INDEPENDENT_AMBULATORY_CARE_PROVIDER_SITE_OTHER): Payer: Medicare Other | Admitting: Family Medicine

## 2012-11-07 ENCOUNTER — Encounter: Payer: Self-pay | Admitting: Family Medicine

## 2012-11-07 VITALS — BP 130/84 | HR 72 | Temp 98.2°F | Ht 65.5 in | Wt 240.8 lb

## 2012-11-07 DIAGNOSIS — J019 Acute sinusitis, unspecified: Secondary | ICD-10-CM | POA: Insufficient documentation

## 2012-11-07 MED ORDER — LEVOFLOXACIN 500 MG PO TABS: 500.0000 mg | ORAL_TABLET | Freq: Every day | ORAL | Status: DC

## 2012-11-07 NOTE — Telephone Encounter (Signed)
Will see her then 

## 2012-11-07 NOTE — Progress Notes (Signed)
Subjective:    Patient ID: Hannah Duran, female    DOB: 1933/10/29, 79 y.o.   MRN: [id number]  HPI Here with uri symptoms that are not getting better after 2 weeks   Is having tremendous sinus congestion and pain  Some cough- prod of clear mucous Nasal discharge is clear to yellow - a little blood No fever Feels generally "bad"  otc - tried tylenol and aspirin  Cough med- tussin   Patient Active Problem List  Diagnosis  . ROTATOR CUFF SYNDROME, RIGHT  . Obesity  . HTN (hypertension)  . HLD (hyperlipidemia)  . Hyperglycemia  . Other screening mammogram  . Caregiver with fatigue  . Internal hemorrhoid  . Yeast vaginitis  . Rectal pain   Past Medical History  Diagnosis Date  . Obesity   . HTN (hypertension)   . HLD (hyperlipidemia)   . Hyperglycemia   . Ischemic colitis 7/13    on colonoscopy  . Congenital foot deformity   . Dermatophytosis   . Rotator cuff syndrome    Past Surgical History  Procedure Laterality Date  . Total abdominal hysterectomy  1980's    due to menometrorrhagia  . Humerus fracture surgery  10/1997   History  Substance Use Topics  . Smoking status: Never Smoker   . Smokeless tobacco: Never Used  . Alcohol Use: No   Family History  Problem Relation Age of Onset  . Cancer Father     bladder  . Stroke Mother   . Parkinsonism Mother   . Dementia Mother   . Hypertension Mother    Allergies  Allergen Reactions  . Penicillins     REACTION: Rash  . Sulfamethoxazole W-Trimethoprim    Current Outpatient Prescriptions on File Prior to Visit  Medication Sig Dispense Refill  . atorvastatin (LIPITOR) 10 MG tablet TAKE ONE TABLET BY MOUTH EVERY DAY  30 tablet  5  . furosemide (LASIX) 20 MG tablet TAKE ONE TABLET BY MOUTH EVERY DAY  30 tablet  6  . ibuprofen (ADVIL,MOTRIN) 200 MG tablet Take 200 mg by mouth as needed.      . valsartan-hydrochlorothiazide (DIOVAN-HCT) 80-12.5 MG per tablet TAKE ONE TABLET BY MOUTH EVERY DAY  30 tablet  6    No current facility-administered medications on file prior to visit.      Review of Systems Review of Systems  Constitutional: Negative for fever, appetite change,  and unexpected weight change. pos for fatigue/ malaise ENT pos for congestion and facial pressure/ neg for ear pain or drainage Eyes: Negative for pain and visual disturbance.  Respiratory: Negative for wheeze and shortness of breath.   Cardiovascular: Negative for cp or palpitations    Gastrointestinal: Negative for nausea, diarrhea and constipation.  Genitourinary: Negative for urgency and frequency.  Skin: Negative for pallor or rash   Neurological: Negative for weakness, light-headedness, numbness and headaches.  Hematological: Negative for adenopathy. Does not bruise/bleed easily.  Psychiatric/Behavioral: Negative for dysphoric mood. The patient is not nervous/anxious.         Objective:   Physical Exam  Constitutional: She appears well-developed and well-nourished. No distress.  HENT:  Head: Normocephalic and atraumatic.  Right Ear: External ear normal.  Left Ear: External ear normal.  Mouth/Throat: Oropharynx is clear and moist. No oropharyngeal exudate.  Nares are injected and congested  bilat maxillary sinus tenderness  Eyes: Conjunctivae and EOM are normal. Pupils are equal, round, and reactive to light. Right eye exhibits no discharge. Left eye exhibits no discharge.  No scleral icterus.  Neck: Normal range of motion. Neck supple.  Cardiovascular: Normal rate and regular rhythm.   Pulmonary/Chest: Effort normal and breath sounds normal. No respiratory distress. She has no wheezes. She has no rales.  Lymphadenopathy:    She has no cervical adenopathy.  Neurological: She is alert. No cranial nerve deficit.  Skin: Skin is warm and dry. No rash noted.  Psychiatric: She has a normal mood and affect.          Assessment & Plan:

## 2012-11-07 NOTE — Patient Instructions (Signed)
For acute sinusitis - take the levaquin  Drink lots of water and can try mucinex to help loosen the crud in your head Also breathe steam  Also try warm compresses on your face

## 2012-11-07 NOTE — Assessment & Plan Note (Signed)
After 2 wk of uri - with worsening facial pressure and congestion and malaise tx with levaquin- pt is pcn and sulfa all Disc symptomatic care - see instructions on AVS Update if not starting to improve in a week or if worsening

## 2012-11-07 NOTE — Telephone Encounter (Signed)
Caller: Hannah Duran/Patient; PCP: [name] (Family Practice); CB#: [phone]; Call regarding she thinks she has a sinus infection.  SX started 2 weeks ago.  She has a cough and  blocked sinuses.   She had tried varied OTC medications and is afebrile.  This AM her nose is closed and she really feels bad.  Traiged Upper Respiratory Infection and needs to be seen in 24 hrs for SX that worsen after 7 days, or SX that do not improve after 14 days of home care.  Appt made for today at 11:45 with [facility].

## 2012-11-07 NOTE — Telephone Encounter (Signed)
[facility] Triage Call Report Triage Record Num: [id number] Operator: Caswell Corwin Patient Name: Hannah Duran Call Date & Time: 11/07/2012 8:24:02AM Patient Phone: [phone] PCP: Audrie Gallus. [facility] Patient Gender: Female PCP Fax : Patient DOB: Sep 28, 1933 Practice Name: [facility] Reason for Call: Caller: [name]/Patient; PCP: [name] [facility]); CB#: [phone]; Call regarding she thinks she has a sinus infection. SX started 2 weeks ago. She has a cough and blocked sinuses. She had tried varied OTC medications and is afebrile. This AM her nose is closed and she really feels bad. Traiged Upper Respiratory Infection and needs to be seen in 24 hrs for SX that worsen after 7 days, or SX that do not improve after 14 days of home care. Appt made for today at 11:45 with [facility]. Protocol(s) Used: Upper Respiratory Infection (URI) Recommended Outcome per Protocol: See Provider within 24 hours Reason for Outcome: Symptoms worsen after 7 days or symptoms do not improve after 14 days of home care Care Advice: ~ 11/07/2012 8:55:06AM Page 1 of 1 [email or web address]

## 2012-11-30 ENCOUNTER — Ambulatory Visit (INDEPENDENT_AMBULATORY_CARE_PROVIDER_SITE_OTHER): Payer: Medicare Other | Admitting: Family Medicine

## 2012-11-30 ENCOUNTER — Encounter: Payer: Self-pay | Admitting: Family Medicine

## 2012-11-30 VITALS — BP 118/86 | HR 60 | Temp 97.7°F | Ht 65.5 in | Wt 238.2 lb

## 2012-11-30 DIAGNOSIS — M67449 Ganglion, unspecified hand: Secondary | ICD-10-CM | POA: Insufficient documentation

## 2012-11-30 DIAGNOSIS — L723 Sebaceous cyst: Secondary | ICD-10-CM

## 2012-11-30 DIAGNOSIS — R7309 Other abnormal glucose: Secondary | ICD-10-CM

## 2012-11-30 DIAGNOSIS — I10 Essential (primary) hypertension: Secondary | ICD-10-CM

## 2012-11-30 DIAGNOSIS — R739 Hyperglycemia, unspecified: Secondary | ICD-10-CM

## 2012-11-30 NOTE — Patient Instructions (Addendum)
Keep working on healthy low sugar diet and weight loss Also exercise at least 5 days per week Keep the finger covered and clean with antibacterial soap and water - if the cyst comes back - call so I can set you up with a specialist Follow up in 6 months with labs prior for annual exam Lab today for A1c

## 2012-11-30 NOTE — Progress Notes (Signed)
Subjective:    Patient ID: Hannah Duran, female    DOB: 1934-08-13, 79 y.o.   MRN: [id number]  HPI Here for f/u of chronic health problems   Wt is down 2 lb with bmi of 39 Is getting more exercise - doing more for her husband - forced to be more physically active  Also appetite is not as good   Is feeling better from her sinus infection   bp is stable today  No cp or palpitations or headaches or edema  No side effects to medicines  BP Readings from Last 3 Encounters:  11/30/12 118/86  11/07/12 130/84  07/13/12 152/78     In good control   Not much time for self care Cares for husb with alz- he was in hosp 2 times in feb and then in rehab and then got some help at home  Now he is doing a bit better than he was   hyperglycmia Lab Results  Component Value Date   HGBA1C 6.4 05/27/2012    Is cooking and eating a healthy diet overall  Is staying away from sugar as a rule and conservative with starches  No excessive thirst or urnation  At the end of the visit pt mentions she has a cyst on her middle finger that came about after slamming her finger in a drawer It only hurts if she hits it on something and no redness or pus  Wanted to know if I can recommend something for it   Patient Active Problem List  Diagnosis  . ROTATOR CUFF SYNDROME, RIGHT  . Obesity  . HTN (hypertension)  . HLD (hyperlipidemia)  . Hyperglycemia  . Other screening mammogram  . Caregiver with fatigue  . Internal hemorrhoid  . Rectal pain  . Mucous cyst of finger   Past Medical History  Diagnosis Date  . Obesity   . HTN (hypertension)   . HLD (hyperlipidemia)   . Hyperglycemia   . Ischemic colitis 7/13    on colonoscopy  . Congenital foot deformity   . Dermatophytosis   . Rotator cuff syndrome    Past Surgical History  Procedure Laterality Date  . Total abdominal hysterectomy  1980's    due to menometrorrhagia  . Humerus fracture surgery  10/1997   History  Substance Use Topics  .  Smoking status: Never Smoker   . Smokeless tobacco: Never Used  . Alcohol Use: No   Family History  Problem Relation Age of Onset  . Cancer Father     bladder  . Stroke Mother   . Parkinsonism Mother   . Dementia Mother   . Hypertension Mother    Allergies  Allergen Reactions  . Penicillins     REACTION: Rash  . Sulfamethoxazole W-Trimethoprim    Current Outpatient Prescriptions on File Prior to Visit  Medication Sig Dispense Refill  . atorvastatin (LIPITOR) 10 MG tablet TAKE ONE TABLET BY MOUTH EVERY DAY  30 tablet  5  . furosemide (LASIX) 20 MG tablet TAKE ONE TABLET BY MOUTH EVERY DAY  30 tablet  6  . valsartan-hydrochlorothiazide (DIOVAN-HCT) 80-12.5 MG per tablet TAKE ONE TABLET BY MOUTH EVERY DAY  30 tablet  6  . ibuprofen (ADVIL,MOTRIN) 200 MG tablet Take 200 mg by mouth as needed.       No current facility-administered medications on file prior to visit.     Review of Systems Review of Systems  Constitutional: Negative for fever, appetite change, fatigue and unexpected weight change.  Eyes: Negative for pain and visual disturbance.  Respiratory: Negative for cough and shortness of breath.   Cardiovascular: Negative for cp or palpitations    Gastrointestinal: Negative for nausea, diarrhea and constipation.  Genitourinary: Negative for urgency and frequency.  Skin: Negative for pallor or rash pos for cyst on finger    Neurological: Negative for weakness, light-headedness, numbness and headaches.  Hematological: Negative for adenopathy. Does not bruise/bleed easily.  Psychiatric/Behavioral: Negative for dysphoric mood. The patient is not nervous/anxious.         Objective:   Physical Exam  Constitutional: She appears well-developed and well-nourished. No distress.  obese and well appearing   HENT:  Head: Normocephalic and atraumatic.  Mouth/Throat: Oropharynx is clear and moist.  Eyes: Conjunctivae and EOM are normal. Pupils are equal, round, and reactive to  light. Right eye exhibits no discharge. Left eye exhibits no discharge. No scleral icterus.  Neck: Normal range of motion. Neck supple. No JVD present. Carotid bruit is not present. No thyromegaly present.  Cardiovascular: Normal rate, regular rhythm, normal heart sounds and intact distal pulses.  Exam reveals no gallop.   Pulmonary/Chest: Effort normal and breath sounds normal. No respiratory distress. She has no wheezes.  Abdominal: She exhibits no abdominal bruit.  Musculoskeletal: She exhibits no edema and no tenderness.  Lymphadenopathy:    She has no cervical adenopathy.  Neurological: She is alert. She has normal reflexes. No sensory deficit. She exhibits normal muscle tone.  Skin: Skin is warm and dry. No rash noted. No erythema.  1.5 cm mucous cyst on distal R middle finger-non tender and not erythematous  This was cleansed in a sterile fashion and lanced with an 18 gauge needle- clear thick mucous was expressed with relief and then dressed with a band aid    Psychiatric: She has a normal mood and affect.          Assessment & Plan:

## 2012-11-30 NOTE — Assessment & Plan Note (Signed)
bp in fair control at this time  No changes needed  Disc lifstyle change with low sodium diet and exercise   

## 2012-11-30 NOTE — Assessment & Plan Note (Signed)
A1C today Disc imp of low glycemic diet and exercise/ wt loss

## 2012-11-30 NOTE — Assessment & Plan Note (Signed)
R middle finger - was pierced with sterile needle and clear mucous expressed-then dressed  inst for aftercare given If recurrent- may need to see derm for removal

## 2012-12-02 ENCOUNTER — Encounter: Payer: Self-pay | Admitting: *Deleted

## 2013-01-07 ENCOUNTER — Other Ambulatory Visit: Payer: Self-pay | Admitting: Family Medicine

## 2013-04-01 ENCOUNTER — Other Ambulatory Visit: Payer: Self-pay | Admitting: Family Medicine

## 2013-05-17 ENCOUNTER — Ambulatory Visit (INDEPENDENT_AMBULATORY_CARE_PROVIDER_SITE_OTHER): Payer: Medicare Other

## 2013-05-17 DIAGNOSIS — Z23 Encounter for immunization: Secondary | ICD-10-CM

## 2013-05-30 ENCOUNTER — Other Ambulatory Visit: Payer: Medicare Other | Admitting: Family Medicine

## 2013-05-30 ENCOUNTER — Telehealth: Payer: Self-pay | Admitting: Family Medicine

## 2013-05-30 ENCOUNTER — Other Ambulatory Visit (INDEPENDENT_AMBULATORY_CARE_PROVIDER_SITE_OTHER): Payer: Medicare Other

## 2013-05-30 DIAGNOSIS — E785 Hyperlipidemia, unspecified: Secondary | ICD-10-CM

## 2013-05-30 DIAGNOSIS — R739 Hyperglycemia, unspecified: Secondary | ICD-10-CM

## 2013-05-30 DIAGNOSIS — I10 Essential (primary) hypertension: Secondary | ICD-10-CM

## 2013-05-30 DIAGNOSIS — R7309 Other abnormal glucose: Secondary | ICD-10-CM

## 2013-05-30 LAB — COMPREHENSIVE METABOLIC PANEL
AST: 18 U/L (ref 0–37)
BUN: 16 mg/dL (ref 6–23)
Calcium: 8.9 mg/dL (ref 8.4–10.5)
Chloride: 102 mEq/L (ref 96–112)
Creatinine, Ser: 0.8 mg/dL (ref 0.4–1.2)
GFR: 73.9 mL/min (ref >60.00)
Total Bilirubin: 1.2 mg/dL (ref 0.3–1.2)

## 2013-05-30 LAB — CBC WITH DIFFERENTIAL/PLATELET
Basophils Relative: 0.7 % (ref 0.0–3.0)
Eosinophils Absolute: 0.2 10*3/uL (ref 0.0–0.7)
HCT: 39.9 % (ref 36.0–46.0)
Hemoglobin: 13.4 g/dL (ref 12.0–15.0)
Lymphocytes Relative: 24.6 % (ref 12.0–46.0)
Lymphs Abs: 2 10*3/uL (ref 0.7–4.0)
MCHC: 33.6 g/dL (ref 30.0–36.0)
MCV: 91.3 fl (ref 78.0–100.0)
Neutro Abs: 5.2 10*3/uL (ref 1.4–7.7)
RBC: 4.37 Mil/uL (ref 3.87–5.11)

## 2013-05-30 LAB — LIPID PANEL
Cholesterol: 150 mg/dL (ref 0–200)
HDL: 36.7 mg/dL — ABNORMAL LOW (ref >39.00)
LDL Cholesterol: 89 mg/dL (ref 0–99)

## 2013-05-30 NOTE — Telephone Encounter (Signed)
Message copied by Judy Pimple on Tue May 30, 2013 11:44 AM ------      Message from: Alvina Chou      Created: Tue May 30, 2013  8:21 AM      Regarding: Lab orders for today       Patient is scheduled for CPX labs, please order future labs, Thanks , Terri       ------

## 2013-06-02 ENCOUNTER — Ambulatory Visit (INDEPENDENT_AMBULATORY_CARE_PROVIDER_SITE_OTHER): Payer: Medicare Other | Admitting: Family Medicine

## 2013-06-02 ENCOUNTER — Encounter: Payer: Self-pay | Admitting: Family Medicine

## 2013-06-02 VITALS — BP 126/74 | HR 56 | Temp 98.3°F | Ht 65.0 in | Wt 243.2 lb

## 2013-06-02 DIAGNOSIS — R7309 Other abnormal glucose: Secondary | ICD-10-CM

## 2013-06-02 DIAGNOSIS — M67449 Ganglion, unspecified hand: Secondary | ICD-10-CM

## 2013-06-02 DIAGNOSIS — E669 Obesity, unspecified: Secondary | ICD-10-CM

## 2013-06-02 DIAGNOSIS — R739 Hyperglycemia, unspecified: Secondary | ICD-10-CM

## 2013-06-02 DIAGNOSIS — I10 Essential (primary) hypertension: Secondary | ICD-10-CM

## 2013-06-02 DIAGNOSIS — L723 Sebaceous cyst: Secondary | ICD-10-CM

## 2013-06-02 DIAGNOSIS — E785 Hyperlipidemia, unspecified: Secondary | ICD-10-CM

## 2013-06-02 DIAGNOSIS — Z0000 Encounter for general adult medical examination without abnormal findings: Secondary | ICD-10-CM

## 2013-06-02 LAB — HM DEXA SCAN

## 2013-06-02 MED ORDER — FUROSEMIDE 20 MG PO TABS: 20.0000 mg | ORAL_TABLET | Freq: Every day | ORAL | Status: DC

## 2013-06-02 MED ORDER — ATORVASTATIN CALCIUM 10 MG PO TABS: 10.0000 mg | ORAL_TABLET | Freq: Every day | ORAL | Status: DC

## 2013-06-02 MED ORDER — VALSARTAN-HYDROCHLOROTHIAZIDE 80-12.5 MG PO TABS: 1.0000 | ORAL_TABLET | Freq: Every day | ORAL | Status: DC

## 2013-06-02 NOTE — Assessment & Plan Note (Signed)
Reviewed health habits including diet and exercise and skin cancer prevention Also reviewed health mt list, fam hx and immunizations  See HPI Pt will schedule her own mammogram  Declines zostavax and dexa

## 2013-06-02 NOTE — Progress Notes (Signed)
Subjective:    Patient ID: Hannah Duran, female    DOB: [birth year], 79 y.o.   MRN: [id number]  HPI I have personally reviewed the Medicare Annual Wellness questionnaire and have noted 1. The patient's medical and social history 2. Their use of alcohol, tobacco or illicit drugs 3. Their current medications and supplements 4. The patient's functional ability including ADL's, fall risks, home safety risks and hearing or visual             impairment. 5. Diet and physical activities 6. Evidence for depression or mood disorders  The patients weight, height, BMI have been recorded in the chart and visual acuity is per eye clinic.  I have made referrals, counseling and provided education to the patient based review of the above and I have provided the pt with a written personalized care plan for preventive services.  Is feeling good overall  Had a very busy summer   Wt is up 5 lb   See scanned forms.  Routine anticipatory guidance given to patient.  See health maintenance. Flu 9/14 done vaccine  Shingles- declines vaccine PNA 3/11 vaccine -done  Tetanus 4/12 vaccine up to date  Colon  5/13  - unsure of recall  Breast cancer screening 5/12 - has not had one since  Self breast exam- no lumps or changes  Advance directive-has a living will already  Cognitive function addressed- see scanned forms- and if abnormal then additional documentation follows. 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  Not much time to take care of herself   Hyperlipidemia Lab Results  Component Value Date   CHOL 150 05/30/2013   CHOL 152 05/27/2012   CHOL 161 11/17/2010   Lab Results  Component  Value Date   HDL 36.70* 05/30/2013   HDL 38.30* 05/27/2012   HDL 41.40 11/17/2010   Lab Results  Component Value Date   LDLCALC 89 05/30/2013   LDLCALC 89 05/27/2012   LDLCALC 99 11/17/2010   Lab Results  Component Value Date   TRIG 124.0 05/30/2013   TRIG 126.0 05/27/2012   TRIG 102.0 11/17/2010   Lab Results  Component Value Date   CHOLHDL 4 05/30/2013   CHOLHDL 4 05/27/2012   CHOLHDL 4 11/17/2010   No results found for this basename: LDLDIRECT    Is in good control for cholesterol  No time for exercise to incr her HDL  bp is stable today  No cp or palpitations or headaches or edema  No side effects to medicines  BP Readings from Last 3 Encounters:  06/02/13 126/74  11/30/12 118/86  11/07/12 130/84     PMH and SH reviewed  Meds, vitals, and allergies reviewed.   ROS: See HPI.  Otherwise negative.     Patient Active Problem List   Diagnosis Date Noted  . Encounter for Medicare annual wellness exam 06/02/2013  . Mucous cyst of finger 11/30/2012  . Rectal pain 06/27/2012  . Internal hemorrhoid 06/21/2012  . Caregiver with fatigue 06/01/2012  . Other screening mammogram 12/01/2010  . Obesity   .  HTN (hypertension)   . HLD (hyperlipidemia)   . Hyperglycemia   . ROTATOR CUFF SYNDROME, RIGHT 09/25/2009   Past Medical History  Diagnosis Date  . Obesity   . HTN (hypertension)   . HLD (hyperlipidemia)   . Hyperglycemia   . Ischemic colitis 7/13    on colonoscopy  . Congenital foot deformity   . Dermatophytosis   . Rotator cuff syndrome    Past Surgical History  Procedure Laterality Date  . Total abdominal hysterectomy  1980's    due to menometrorrhagia  . Humerus fracture surgery  10/1997   History  Substance Use Topics  . Smoking status: Never Smoker   . Smokeless tobacco: Never Used  . Alcohol Use: No   Family History  Problem Relation Age of Onset  . Cancer Father     bladder  . Stroke Mother   . Parkinsonism Mother   . Dementia Mother   .  Hypertension Mother    Allergies  Allergen Reactions  . Penicillins     REACTION: Rash  . Sulfamethoxazole-Trimethoprim    Current Outpatient Prescriptions on File Prior to Visit  Medication Sig Dispense Refill  . atorvastatin (LIPITOR) 10 MG tablet TAKE ONE TABLET BY MOUTH EVERY DAY  30 tablet  5  . furosemide (LASIX) 20 MG tablet TAKE ONE TABLET BY MOUTH EVERY DAY  30 tablet  5  . ibuprofen (ADVIL,MOTRIN) 200 MG tablet Take 200 mg by mouth as needed.      . valsartan-hydrochlorothiazide (DIOVAN-HCT) 80-12.5 MG per tablet TAKE ONE TABLET BY MOUTH ONCE DAILY  30 tablet  2   No current facility-administered medications on file prior to visit.     Review of Systems Review of Systems  Constitutional: Negative for fever, appetite change,  and unexpected weight change. pos for fatigue from stressors and caregiving  Eyes: Negative for pain and visual disturbance.  Respiratory: Negative for cough and shortness of breath.   Cardiovascular: Negative for cp or palpitations    Gastrointestinal: Negative for nausea, diarrhea and constipation.  Genitourinary: Negative for urgency and frequency.  Skin: Negative for pallor or rash   Neurological: Negative for weakness, light-headedness, numbness and headaches.  Hematological: Negative for adenopathy. Does not bruise/bleed easily.  Psychiatric/Behavioral: Negative for dysphoric mood. The patient is not nervous/anxious.  pos for caregiver stress       Objective:   Physical Exam  Constitutional: She appears well-developed and well-nourished. No distress.  obese and well appearing   HENT:  Head: Normocephalic and atraumatic.  Right Ear: External ear normal.  Left Ear: External ear normal.  Nose: Nose normal.  Mouth/Throat: Oropharynx is clear and moist.  Eyes: Conjunctivae and EOM are normal. Pupils are equal, round, and reactive to light. Right eye exhibits no discharge. No scleral icterus.  Neck: Normal range of motion. Neck supple. No JVD  present. Erythema present. No thyromegaly present.  Cardiovascular: Normal rate, regular rhythm, normal heart sounds and intact distal pulses.  Exam reveals no gallop.   Pulmonary/Chest: Effort normal and breath sounds normal. No respiratory distress. She has no wheezes. She has no rales.  Abdominal: Soft. Bowel sounds are normal. She exhibits no distension and no mass. There is no tenderness.  Genitourinary:  Pt declines breast exam  Musculoskeletal: She exhibits no edema and no tenderness.  Lymphadenopathy:    She has no cervical adenopathy.  Neurological: She is alert. She has normal reflexes. No cranial nerve deficit. She exhibits normal muscle tone. Coordination normal.  Skin: Skin  is warm and dry. No rash noted. No erythema. No pallor.  Mucous cysts of both fingers remain - after several derm attempts for removal and drainage  Psychiatric: She has a normal mood and affect.          Assessment & Plan:

## 2013-06-02 NOTE — Patient Instructions (Addendum)
You have early diabetes - I want you to work on weight loss Avoid sweets and sugar as much as you can  Also be as active as you can  Here are some handouts on diabetes Don't forget to make your mammogram appointment  Follow up in 6 months for diabetes with lab prior

## 2013-06-04 NOTE — Assessment & Plan Note (Signed)
Disc goals for lipids and reasons to control them Rev labs with pt Rev low sat fat diet in detail   

## 2013-06-04 NOTE — Assessment & Plan Note (Signed)
Newly DM-prev hyperglycemic Pt unable to do DM ed at this time Handouts given- disc lifestyle change in detail  F/u plan made for lab and visit  Disc imp of eye and foot care

## 2013-06-04 NOTE — Assessment & Plan Note (Signed)
bp in fair control at this time  No changes needed  Disc lifstyle change with low sodium diet and exercise  Labs reviewed  

## 2013-06-04 NOTE — Assessment & Plan Note (Signed)
Discussed how this problem influences overall health and the risks it imposes  Reviewed plan for weight loss with lower calorie diet (via better food choices and also portion control or program like weight watchers) and exercise building up to or more than 30 minutes 5 days per week including some aerobic activity    

## 2013-07-10 ENCOUNTER — Encounter: Payer: Self-pay | Admitting: Family Medicine

## 2013-07-10 ENCOUNTER — Ambulatory Visit: Payer: Self-pay | Admitting: Family Medicine

## 2013-07-11 ENCOUNTER — Encounter: Payer: Self-pay | Admitting: *Deleted

## 2013-09-05 ENCOUNTER — Ambulatory Visit (INDEPENDENT_AMBULATORY_CARE_PROVIDER_SITE_OTHER): Payer: Medicare HMO | Admitting: Family Medicine

## 2013-09-05 ENCOUNTER — Encounter: Payer: Self-pay | Admitting: Family Medicine

## 2013-09-05 VITALS — BP 110/66 | HR 69 | Temp 97.8°F | Ht 65.0 in | Wt 240.0 lb

## 2013-09-05 DIAGNOSIS — B351 Tinea unguium: Secondary | ICD-10-CM

## 2013-09-05 MED ORDER — TERBINAFINE HCL 250 MG PO TABS: 250.0000 mg | ORAL_TABLET | Freq: Every day | ORAL | Status: DC

## 2013-09-05 NOTE — Progress Notes (Signed)
Subjective:    Patient ID: Hannah Duran, female    DOB: [birth year], 80 y.o.   MRN: [id number]  HPI Here with a toenail problem- thinks she has a fungal infection   She used something otc for a week   Had a pedicure - and the problem started after that  Nail tech put a stick too far under the nail  The place she goes does sterilize the equiptment   Does not tend to have itchy feet   Nail hurts a bit    Patient Active Problem List   Diagnosis Date Noted  . Fungal nail infection 09/05/2013  . Encounter for Medicare annual wellness exam 06/02/2013  . Mucous cyst of finger 11/30/2012  . Rectal pain 06/27/2012  . Internal hemorrhoid 06/21/2012  . Caregiver with fatigue 06/01/2012  . Other screening mammogram 12/01/2010  . Obesity   . HTN (hypertension)   . HLD (hyperlipidemia)   . Diabetes type 2, controlled   . ROTATOR CUFF SYNDROME, RIGHT 09/25/2009   Past Medical History  Diagnosis Date  . Obesity   . HTN (hypertension)   . HLD (hyperlipidemia)   . Hyperglycemia   . Ischemic colitis 7/13    on colonoscopy  . Congenital foot deformity   . Dermatophytosis   . Rotator cuff syndrome    Past Surgical History  Procedure Laterality Date  . Total abdominal hysterectomy  1980's    due to menometrorrhagia  . Humerus fracture surgery  10/1997   History  Substance Use Topics  . Smoking status: Never Smoker   . Smokeless tobacco: Never Used  . Alcohol Use: No   Family History  Problem Relation Age of Onset  . Cancer Father     bladder  . Stroke Mother   . Parkinsonism Mother   . Dementia Mother   . Hypertension Mother    Allergies  Allergen Reactions  . Penicillins     REACTION: Rash  . Sulfamethoxazole-Trimethoprim    Current Outpatient Prescriptions on File Prior to Visit  Medication Sig Dispense Refill  . atorvastatin (LIPITOR) 10 MG tablet Take 1 tablet (10 mg total) by mouth daily.  30 tablet  11  . furosemide (LASIX) 20 MG tablet Take 1 tablet (20 mg  total) by mouth daily.  30 tablet  11  . ibuprofen (ADVIL,MOTRIN) 200 MG tablet Take 200 mg by mouth as needed.      . valsartan-hydrochlorothiazide (DIOVAN-HCT) 80-12.5 MG per tablet Take 1 tablet by mouth daily.  30 tablet  11   No current facility-administered medications on file prior to visit.    Review of Systems Review of Systems  Constitutional: Negative for fever, appetite change, fatigue and unexpected weight change.  Eyes: Negative for pain and visual disturbance.  Respiratory: Negative for cough and shortness of breath.   Cardiovascular: Negative for cp or palpitations    Gastrointestinal: Negative for nausea, diarrhea and constipation.  Genitourinary: Negative for urgency and frequency.  Skin: Negative for pallor or rash   Neurological: Negative for weakness, light-headedness, numbness and headaches.  Hematological: Negative for adenopathy. Does not bruise/bleed easily.  Psychiatric/Behavioral: Negative for dysphoric mood. The patient is not nervous/anxious.         Objective:   Physical Exam  Constitutional: She appears well-developed and well-nourished. No distress.  obese and well appearing   HENT:  Head: Normocephalic and atraumatic.  Eyes: Conjunctivae and EOM are normal. Pupils are equal, round, and reactive to light.  Neck: Normal range of  motion. Neck supple.  Cardiovascular: Regular rhythm.   Musculoskeletal: She exhibits no edema and no tenderness.  Lymphadenopathy:    She has no cervical adenopathy.  Neurological: She is alert.  Skin: Skin is warm and dry.  L foot - skin is dry w/o signs of tinea Nails are thickened and yellow/grey discoloration  Area of trauma noted under great toenail  Nails on other foot are clear   Psychiatric: She has a normal mood and affect.          Assessment & Plan:

## 2013-09-05 NOTE — Progress Notes (Signed)
Pre-visit discussion using our clinic review tool. No additional management support is needed unless otherwise documented below in the visit note.  

## 2013-09-05 NOTE — Patient Instructions (Signed)
Take the lamasil daily for 3 months  Follow up with me in 3 months to re check toenails  Schedule non fasting labs for 1 month to check liver profile  Keep shoes clean and dry - wearing socks helps

## 2013-09-06 NOTE — Assessment & Plan Note (Signed)
R foot - great toe and 3-5th nails -thickened and yellow Some trauma to great toenail also  Will avoid pedicure during tx  tx with daily lamasil (lab at 1 mo)- and f/u 3 mo  Will update if symptoms worsen

## 2013-10-03 ENCOUNTER — Other Ambulatory Visit (INDEPENDENT_AMBULATORY_CARE_PROVIDER_SITE_OTHER): Payer: Medicare HMO

## 2013-10-03 DIAGNOSIS — B351 Tinea unguium: Secondary | ICD-10-CM

## 2013-10-03 LAB — HEPATIC FUNCTION PANEL
ALK PHOS: 92 U/L (ref 39–117)
ALT: 13 U/L (ref 0–35)
AST: 19 U/L (ref 0–37)
Albumin: 3.9 g/dL (ref 3.5–5.2)
BILIRUBIN DIRECT: 0 mg/dL (ref 0.0–0.3)
BILIRUBIN TOTAL: 0.9 mg/dL (ref 0.3–1.2)
TOTAL PROTEIN: 7 g/dL (ref 6.0–8.3)

## 2013-11-27 ENCOUNTER — Other Ambulatory Visit: Payer: Medicare Other

## 2013-12-04 ENCOUNTER — Ambulatory Visit: Payer: Medicare Other | Admitting: Family Medicine

## 2013-12-05 ENCOUNTER — Ambulatory Visit: Payer: Medicare HMO | Admitting: Family Medicine

## 2013-12-05 ENCOUNTER — Other Ambulatory Visit (INDEPENDENT_AMBULATORY_CARE_PROVIDER_SITE_OTHER): Payer: Commercial Managed Care - HMO

## 2013-12-05 DIAGNOSIS — I10 Essential (primary) hypertension: Secondary | ICD-10-CM

## 2013-12-05 DIAGNOSIS — R739 Hyperglycemia, unspecified: Secondary | ICD-10-CM

## 2013-12-05 DIAGNOSIS — R7309 Other abnormal glucose: Secondary | ICD-10-CM

## 2013-12-05 LAB — COMPREHENSIVE METABOLIC PANEL
ALT: 15 U/L (ref 0–35)
AST: 19 U/L (ref 0–37)
Albumin: 3.7 g/dL (ref 3.5–5.2)
Alkaline Phosphatase: 90 U/L (ref 39–117)
BUN: 15 mg/dL (ref 6–23)
CALCIUM: 8.9 mg/dL (ref 8.4–10.5)
CHLORIDE: 101 meq/L (ref 96–112)
CO2: 34 mEq/L — ABNORMAL HIGH (ref 19–32)
CREATININE: 0.7 mg/dL (ref 0.4–1.2)
GFR: 87.54 mL/min (ref >60.00)
Glucose, Bld: 118 mg/dL — ABNORMAL HIGH (ref 70–99)
POTASSIUM: 3.4 meq/L — AB (ref 3.5–5.1)
Sodium: 140 mEq/L (ref 135–145)
Total Bilirubin: 0.6 mg/dL (ref 0.3–1.2)
Total Protein: 6.3 g/dL (ref 6.0–8.3)

## 2013-12-05 LAB — HEMOGLOBIN A1C: HEMOGLOBIN A1C: 6.5 % (ref 4.6–6.5)

## 2013-12-06 ENCOUNTER — Telehealth: Payer: Self-pay | Admitting: Family Medicine

## 2013-12-06 NOTE — Telephone Encounter (Signed)
Relevant patient education mailed to patient.  

## 2013-12-12 ENCOUNTER — Ambulatory Visit (INDEPENDENT_AMBULATORY_CARE_PROVIDER_SITE_OTHER): Payer: Commercial Managed Care - HMO | Admitting: Family Medicine

## 2013-12-12 ENCOUNTER — Encounter: Payer: Self-pay | Admitting: Family Medicine

## 2013-12-12 VITALS — BP 108/64 | HR 59 | Temp 98.2°F | Ht 65.0 in | Wt 242.0 lb

## 2013-12-12 DIAGNOSIS — B351 Tinea unguium: Secondary | ICD-10-CM

## 2013-12-12 DIAGNOSIS — E669 Obesity, unspecified: Secondary | ICD-10-CM

## 2013-12-12 DIAGNOSIS — I10 Essential (primary) hypertension: Secondary | ICD-10-CM

## 2013-12-12 DIAGNOSIS — E119 Type 2 diabetes mellitus without complications: Secondary | ICD-10-CM

## 2013-12-12 NOTE — Progress Notes (Signed)
Pre visit review using our clinic review tool, if applicable. No additional management support is needed unless otherwise documented below in the visit note. 

## 2013-12-12 NOTE — Progress Notes (Signed)
Subjective:    Patient ID: Hannah Duran, female    DOB: October 01, 1933, 80 y.o.   MRN: [id number]  HPI Here for f/u of chronic medical problems   Wt is up 2 lb with bmi of 40   Diabetes Home sugar results  DM diet - has not been very good with diet (" I have to have energy ")- is working hard  Exercise getting a fair amt caring for husband and household  Also is an outdoor person -very active  Symptoms-none  A1C last  Lab Results  Component Value Date   HGBA1C 6.5 12/05/2013    No problems with medications  Renal protection- diovan  Last eye exam -?  K was slt low 3.4 - does not take a mvi    bp is stable today  No cp or palpitations or headaches or edema  No side effects to medicines  BP Readings from Last 3 Encounters:  12/12/13 108/64  09/05/13 110/66  06/02/13 126/74      More problems with OA lately- in knee and also hip  Using ben gay  Also hot and cold packs  Has not had to use a cane and does not want to see a specialist yet She has to take care of her husband   Here fungal toenail is slowly getting   Patient Active Problem List   Diagnosis Date Noted  . Fungal nail infection 09/05/2013  . Encounter for Medicare annual wellness exam 06/02/2013  . Mucous cyst of finger 11/30/2012  . Rectal pain 06/27/2012  . Internal hemorrhoid 06/21/2012  . Caregiver with fatigue 06/01/2012  . Other screening mammogram 12/01/2010  . Obesity   . HTN (hypertension)   . HLD (hyperlipidemia)   . Diabetes type 2, controlled   . ROTATOR CUFF SYNDROME, RIGHT 09/25/2009   Past Medical History  Diagnosis Date  . Obesity   . HTN (hypertension)   . HLD (hyperlipidemia)   . Hyperglycemia   . Ischemic colitis 7/13    on colonoscopy  . Congenital foot deformity   . Dermatophytosis   . Rotator cuff syndrome    Past Surgical History  Procedure Laterality Date  . Total abdominal hysterectomy  1980's    due to menometrorrhagia  . Humerus fracture surgery  10/1997    History  Substance Use Topics  . Smoking status: Never Smoker   . Smokeless tobacco: Never Used  . Alcohol Use: No   Family History  Problem Relation Age of Onset  . Cancer Father     bladder  . Stroke Mother   . Parkinsonism Mother   . Dementia Mother   . Hypertension Mother    Allergies  Allergen Reactions  . Penicillins     REACTION: Rash  . Sulfamethoxazole-Trimethoprim    Current Outpatient Prescriptions on File Prior to Visit  Medication Sig Dispense Refill  . atorvastatin (LIPITOR) 10 MG tablet Take 1 tablet (10 mg total) by mouth daily.  30 tablet  11  . furosemide (LASIX) 20 MG tablet Take 1 tablet (20 mg total) by mouth daily.  30 tablet  11  . ibuprofen (ADVIL,MOTRIN) 200 MG tablet Take 200 mg by mouth as needed.      . terbinafine (LAMISIL) 250 MG tablet Take 1 tablet (250 mg total) by mouth daily.  30 tablet  3  . Tolnaftate (ANTIFUNGAL EX) Apply 1 application topically 2 (two) times daily.      . valsartan-hydrochlorothiazide (DIOVAN-HCT) 80-12.5 MG per tablet Take 1  tablet by mouth daily.  30 tablet  11   No current facility-administered medications on file prior to visit.    Review of Systems Review of Systems  Constitutional: Negative for fever, appetite change, fatigue and unexpected weight change.  Eyes: Negative for pain and visual disturbance.  Respiratory: Negative for cough and shortness of breath.   Cardiovascular: Negative for cp or palpitations    Gastrointestinal: Negative for nausea, diarrhea and constipation.  Genitourinary: Negative for urgency and frequency.  Skin: Negative for pallor or rash  pos for thickened toenails Neurological: Negative for weakness, light-headedness, numbness and headaches.  Hematological: Negative for adenopathy. Does not bruise/bleed easily.  Psychiatric/Behavioral: Negative for dysphoric mood. The patient is not nervous/anxious.         Objective:   Physical Exam  Constitutional: She appears well-developed  and well-nourished. No distress.  obese and well appearing   HENT:  Head: Normocephalic and atraumatic.  Mouth/Throat: Oropharynx is clear and moist.  Eyes: Conjunctivae and EOM are normal. Pupils are equal, round, and reactive to light. Left eye exhibits no discharge. No scleral icterus.  Neck: Normal range of motion. Neck supple. No JVD present. Carotid bruit is not present. No thyromegaly present.  Cardiovascular: Normal rate, regular rhythm and intact distal pulses.  Exam reveals no gallop.   Pulmonary/Chest: Effort normal and breath sounds normal. No respiratory distress. She has no wheezes. She has no rales.  Abdominal: She exhibits no abdominal bruit.  Musculoskeletal: She exhibits no edema.  Lymphadenopathy:    She has no cervical adenopathy.  Neurological: She is alert. She has normal reflexes. No cranial nerve deficit. She exhibits normal muscle tone. Coordination normal.  Skin: Skin is warm and dry. No rash noted. No pallor.  Psychiatric: She has a normal mood and affect.          Assessment & Plan:

## 2013-12-12 NOTE — Patient Instructions (Signed)
Try to stick to a diabetic diet the best you can and work on weight loss Avoid sugar drinks and juices Start a multivitamin once daily since potassium is slightly low  Stay as active as you can be  Follow up in 6 months for an annual exam with labs prior

## 2013-12-14 NOTE — Assessment & Plan Note (Signed)
bp in fair control at this time  BP Readings from Last 1 Encounters:  12/12/13 108/64   No changes needed Disc lifstyle change with low sodium diet and exercise   Labs rev

## 2013-12-14 NOTE — Assessment & Plan Note (Signed)
Pt will continue lamasil May also need debridement of nails from podiatry to get effectiveness

## 2013-12-14 NOTE — Assessment & Plan Note (Signed)
Lab Results  Component Value Date   HGBA1C 6.5 12/05/2013    Pt is somewhat resistant to change in diet Stressed imp of wt loss / foot checks and regular eye exams F/u 6 mo

## 2013-12-14 NOTE — Assessment & Plan Note (Signed)
Discussed how this problem influences overall health and the risks it imposes  Reviewed plan for weight loss with lower calorie diet (via better food choices and also portion control or program like weight watchers) and exercise building up to or more than 30 minutes 5 days per week including some aerobic activity    

## 2013-12-26 ENCOUNTER — Telehealth: Payer: Self-pay

## 2013-12-26 NOTE — Telephone Encounter (Signed)
Relevant patient education mailed to patient.  

## 2014-05-22 ENCOUNTER — Telehealth: Payer: Self-pay | Admitting: Family Medicine

## 2014-05-22 ENCOUNTER — Ambulatory Visit (INDEPENDENT_AMBULATORY_CARE_PROVIDER_SITE_OTHER): Payer: Commercial Managed Care - HMO

## 2014-05-22 DIAGNOSIS — Z23 Encounter for immunization: Secondary | ICD-10-CM

## 2014-05-22 DIAGNOSIS — L989 Disorder of the skin and subcutaneous tissue, unspecified: Secondary | ICD-10-CM

## 2014-05-22 DIAGNOSIS — L918 Other hypertrophic disorders of the skin: Secondary | ICD-10-CM

## 2014-05-22 NOTE — Telephone Encounter (Signed)
Pt wants to see Dr. Kellie Moor to eval some skin tags on body especially the ones near bra line, and also she has a brown spot on each of her facial cheeks she would like to have checked, pt said she has already scheduled an appt with them for Nov, but they called her and advise her that she needs a referral from [place] to be seen there

## 2014-05-22 NOTE — Telephone Encounter (Signed)
Pt came in this morning inquiring about a referral to a Dermatologist name Karle Barr MD with [facility].

## 2014-05-22 NOTE — Telephone Encounter (Signed)
Please confirm diagnosis with her - and I will do the referral

## 2014-05-23 DIAGNOSIS — L918 Other hypertrophic disorders of the skin: Secondary | ICD-10-CM | POA: Insufficient documentation

## 2014-05-23 DIAGNOSIS — L989 Disorder of the skin and subcutaneous tissue, unspecified: Secondary | ICD-10-CM | POA: Insufficient documentation

## 2014-05-23 NOTE — Telephone Encounter (Signed)
Ref done  

## 2014-06-10 ENCOUNTER — Telehealth: Payer: Self-pay | Admitting: Family Medicine

## 2014-06-10 DIAGNOSIS — E119 Type 2 diabetes mellitus without complications: Secondary | ICD-10-CM

## 2014-06-10 DIAGNOSIS — I10 Essential (primary) hypertension: Secondary | ICD-10-CM

## 2014-06-10 DIAGNOSIS — E785 Hyperlipidemia, unspecified: Secondary | ICD-10-CM

## 2014-06-10 NOTE — Telephone Encounter (Signed)
Message copied by Abner Greenspan on Sun Jun 10, 2014 12:22 PM ------      Message from: Ellamae Sia      Created: Wed Jun 06, 2014  6:01 PM      Regarding: Lab orders for Monday, 10.12.15       Patient is scheduled for CPX labs, please order future labs, Thanks , Terri       ------

## 2014-06-11 ENCOUNTER — Other Ambulatory Visit (INDEPENDENT_AMBULATORY_CARE_PROVIDER_SITE_OTHER): Payer: Commercial Managed Care - HMO

## 2014-06-11 DIAGNOSIS — I10 Essential (primary) hypertension: Secondary | ICD-10-CM

## 2014-06-11 DIAGNOSIS — E119 Type 2 diabetes mellitus without complications: Secondary | ICD-10-CM

## 2014-06-11 DIAGNOSIS — E785 Hyperlipidemia, unspecified: Secondary | ICD-10-CM

## 2014-06-11 LAB — CBC WITH DIFFERENTIAL/PLATELET
Basophils Absolute: 0.1 10*3/uL (ref 0.0–0.1)
Basophils Relative: 0.8 % (ref 0.0–3.0)
EOS PCT: 2.8 % (ref 0.0–5.0)
Eosinophils Absolute: 0.2 10*3/uL (ref 0.0–0.7)
HCT: 39.8 % (ref 36.0–46.0)
Hemoglobin: 12.9 g/dL (ref 12.0–15.0)
Lymphocytes Relative: 26.4 % (ref 12.0–46.0)
Lymphs Abs: 1.9 10*3/uL (ref 0.7–4.0)
MCHC: 32.5 g/dL (ref 30.0–36.0)
MCV: 91 fl (ref 78.0–100.0)
MONO ABS: 0.8 10*3/uL (ref 0.1–1.0)
Monocytes Relative: 10.2 % (ref 3.0–12.0)
NEUTROS PCT: 59.8 % (ref 43.0–77.0)
Neutro Abs: 4.4 10*3/uL (ref 1.4–7.7)
Platelets: 291 10*3/uL (ref 150.0–400.0)
RBC: 4.37 Mil/uL (ref 3.87–5.11)
RDW: 14.1 % (ref 11.5–15.5)
WBC: 7.4 10*3/uL (ref 4.0–10.5)

## 2014-06-11 LAB — COMPREHENSIVE METABOLIC PANEL
ALBUMIN: 3.5 g/dL (ref 3.5–5.2)
ALK PHOS: 83 U/L (ref 39–117)
ALT: 19 U/L (ref 0–35)
AST: 23 U/L (ref 0–37)
BILIRUBIN TOTAL: 0.9 mg/dL (ref 0.2–1.2)
BUN: 17 mg/dL (ref 6–23)
CO2: 27 mEq/L (ref 19–32)
Calcium: 9.3 mg/dL (ref 8.4–10.5)
Chloride: 101 mEq/L (ref 96–112)
Creatinine, Ser: 0.8 mg/dL (ref 0.4–1.2)
GFR: 71.63 mL/min (ref >60.00)
Glucose, Bld: 102 mg/dL — ABNORMAL HIGH (ref 70–99)
Potassium: 4.1 mEq/L (ref 3.5–5.1)
Sodium: 137 mEq/L (ref 135–145)
Total Protein: 7.2 g/dL (ref 6.0–8.3)

## 2014-06-11 LAB — TSH: TSH: 2.65 u[IU]/mL (ref 0.35–4.50)

## 2014-06-11 LAB — HEMOGLOBIN A1C: HEMOGLOBIN A1C: 6.4 % (ref 4.6–6.5)

## 2014-06-11 LAB — LIPID PANEL
Cholesterol: 151 mg/dL (ref 0–200)
HDL: 30.8 mg/dL — ABNORMAL LOW (ref >39.00)
LDL Cholesterol: 93 mg/dL (ref 0–99)
NonHDL: 120.2
TRIGLYCERIDES: 137 mg/dL (ref 0.0–149.0)
Total CHOL/HDL Ratio: 5
VLDL: 27.4 mg/dL (ref 0.0–40.0)

## 2014-06-18 ENCOUNTER — Ambulatory Visit (INDEPENDENT_AMBULATORY_CARE_PROVIDER_SITE_OTHER): Payer: Commercial Managed Care - HMO | Admitting: Family Medicine

## 2014-06-18 ENCOUNTER — Encounter: Payer: Self-pay | Admitting: Family Medicine

## 2014-06-18 VITALS — BP 130/74 | HR 60 | Temp 98.1°F | Ht 65.0 in | Wt 245.0 lb

## 2014-06-18 DIAGNOSIS — E669 Obesity, unspecified: Secondary | ICD-10-CM

## 2014-06-18 DIAGNOSIS — E785 Hyperlipidemia, unspecified: Secondary | ICD-10-CM

## 2014-06-18 DIAGNOSIS — Z23 Encounter for immunization: Secondary | ICD-10-CM

## 2014-06-18 DIAGNOSIS — E119 Type 2 diabetes mellitus without complications: Secondary | ICD-10-CM

## 2014-06-18 DIAGNOSIS — I10 Essential (primary) hypertension: Secondary | ICD-10-CM

## 2014-06-18 DIAGNOSIS — Z0000 Encounter for general adult medical examination without abnormal findings: Secondary | ICD-10-CM

## 2014-06-18 MED ORDER — FUROSEMIDE 20 MG PO TABS: 20.0000 mg | ORAL_TABLET | Freq: Every day | ORAL | Status: DC

## 2014-06-18 MED ORDER — ATORVASTATIN CALCIUM 10 MG PO TABS: 10.0000 mg | ORAL_TABLET | Freq: Every day | ORAL | Status: DC

## 2014-06-18 MED ORDER — VALSARTAN-HYDROCHLOROTHIAZIDE 80-12.5 MG PO TABS: 1.0000 | ORAL_TABLET | Freq: Every day | ORAL | Status: DC

## 2014-06-18 NOTE — Progress Notes (Signed)
Pre visit review using our clinic review tool, if applicable. No additional management support is needed unless otherwise documented below in the visit note. 

## 2014-06-18 NOTE — Assessment & Plan Note (Signed)
Reviewed health habits including diet and exercise and skin cancer prevention Reviewed appropriate screening tests for age  Also reviewed health mt list, fam hx and immunization status , as well as social and family history   See HPI Labs rev in detail prevnar vaccine today She will schedule own mammogram

## 2014-06-18 NOTE — Assessment & Plan Note (Signed)
HDL is a bit lower Disc goals for lipids and reasons to control them Rev labs with pt Rev low sat fat diet in detail

## 2014-06-18 NOTE — Assessment & Plan Note (Signed)
Lab Results  Component Value Date   HGBA1C 6.4 06/11/2014   slt improved Stressed imp of wt loss  Will schedule her own eye appt

## 2014-06-18 NOTE — Progress Notes (Signed)
Subjective:    Patient ID: Hannah Duran, female    DOB: 12/18/1933, 80 y.o.   MRN: [id number]  HPI I have personally reviewed the Medicare Annual Wellness questionnaire and have noted 1. The patient's medical and social history 2. Their use of alcohol, tobacco or illicit drugs 3. Their current medications and supplements 4. The patient's functional ability including ADL's, fall risks, home safety risks and hearing or visual             impairment. 5. Diet and physical activities 6. Evidence for depression or mood disorders  The patients weight, height, BMI have been recorded in the chart and visual acuity is per eye clinic.  I have made referrals, counseling and provided education to the patient based review of the above and I have provided the pt with a written personalized care plan for preventive services.  Is feeling ok overall  Stays a bit tired with age  Hannah Duran to take good care of herself  Wt is up 3 lb with bmi of 46  Still caring for her husband  She has someone helping with yard/ grass  No help with him however    See scanned forms.  Routine anticipatory guidance given to patient.  See health maintenance. Colon cancer screening colonosc 5/13  Breast cancer screening mammogram was 11/14  Self breast exam -no lumps or changes on exam No gyn problems  Flu vaccine-9 /22/15 Tetanus vaccine 4/12 Pneumovax 3/11   , will get prevnar today  Zoster vaccine-- she declines   Advance directive- has that already drawn up  Cognitive function addressed- see scanned forms- and if abnormal then additional documentation follows. No problems with memory at all   PMH and [place] reviewed  Meds, vitals, and allergies reviewed.   ROS: See HPI.  Otherwise negative.     bp is stable today  No cp or palpitations or headaches or edema  No side effects to medicines  BP Readings from Last 3 Encounters:  06/18/14 130/74  12/12/13 108/64  09/05/13 110/66      DM Lab Results    Component Value Date   HGBA1C 6.4 06/11/2014   This is down from 6.5  Pretty well controlled  (also more active during the day)- has avoided desserts and baking  Opthy - has been several years (her eyes are improved)   Hyperlipidemia Lab Results  Component Value Date   CHOL 151 06/11/2014   CHOL 150 05/30/2013   CHOL 152 05/27/2012   Lab Results  Component Value Date   HDL 30.80* 06/11/2014   HDL 36.70* 05/30/2013   HDL 38.30* 05/27/2012   Lab Results  Component Value Date   LDLCALC 93 06/11/2014   LDLCALC 89 05/30/2013   LDLCALC 89 05/27/2012   Lab Results  Component Value Date   TRIG 137.0 06/11/2014   TRIG 124.0 05/30/2013   TRIG 126.0 05/27/2012   Lab Results  Component Value Date   CHOLHDL 5 06/11/2014   CHOLHDL 4 05/30/2013   CHOLHDL 4 05/27/2012   No results found for this basename: LDLDIRECT   on lipitor and diet  Good cholesterol came down a bit - ? Exercise related    Patient Active Problem List   Diagnosis Date Noted  . Cutaneous skin tags 05/23/2014  . Skin lesion of face 05/23/2014  . Fungal nail infection 09/05/2013  . Encounter for Medicare annual wellness exam 06/02/2013  . Mucous cyst of finger 11/30/2012  . Rectal pain 06/27/2012  .  Internal hemorrhoid 06/21/2012  . Caregiver with fatigue 06/01/2012  . Other screening mammogram 12/01/2010  . Obesity   . HTN (hypertension)   . HLD (hyperlipidemia)   . Diabetes type 2, controlled   . ROTATOR CUFF SYNDROME, RIGHT 09/25/2009   Past Medical History  Diagnosis Date  . Obesity   . HTN (hypertension)   . HLD (hyperlipidemia)   . Hyperglycemia   . Ischemic colitis 7/13    on colonoscopy  . Congenital foot deformity   . Dermatophytosis   . Rotator cuff syndrome    Past Surgical History  Procedure Laterality Date  . Total abdominal hysterectomy  1980's    due to menometrorrhagia  . Humerus fracture surgery  10/1997   History  Substance Use Topics  . Smoking status: Never Smoker   .  Smokeless tobacco: Never Used  . Alcohol Use: No   Family History  Problem Relation Age of Onset  . Cancer Father     bladder  . Stroke Mother   . Parkinsonism Mother   . Dementia Mother   . Hypertension Mother    Allergies  Allergen Reactions  . Penicillins     REACTION: Rash  . Sulfamethoxazole-Trimethoprim    Current Outpatient Prescriptions on File Prior to Visit  Medication Sig Dispense Refill  . atorvastatin (LIPITOR) 10 MG tablet Take 1 tablet (10 mg total) by mouth daily.  30 tablet  11  . furosemide (LASIX) 20 MG tablet Take 1 tablet (20 mg total) by mouth daily.  30 tablet  11  . valsartan-hydrochlorothiazide (DIOVAN-HCT) 80-12.5 MG per tablet Take 1 tablet by mouth daily.  30 tablet  11   No current facility-administered medications on file prior to visit.      Review of Systems Review of Systems  Constitutional: Negative for fever, appetite change, fatigue and unexpected weight change.  Eyes: Negative for pain and visual disturbance.  Respiratory: Negative for cough and shortness of breath.   Cardiovascular: Negative for cp or palpitations    Gastrointestinal: Negative for nausea, diarrhea and constipation.  Genitourinary: Negative for urgency and frequency.  Skin: Negative for pallor or rash   Neurological: Negative for weakness, light-headedness, numbness and headaches.  Hematological: Negative for adenopathy. Does not bruise/bleed easily.  Psychiatric/Behavioral: Negative for dysphoric mood. The patient is not nervous/anxious.         Objective:   Physical Exam  Constitutional: She appears well-developed and well-nourished. No distress.  obese and well appearing   HENT:  Head: Normocephalic and atraumatic.  Right Ear: External ear normal.  Left Ear: External ear normal.  Mouth/Throat: Oropharynx is clear and moist.  Eyes: Conjunctivae and EOM are normal. Pupils are equal, round, and reactive to light. No scleral icterus.  Neck: Normal range of  motion. Neck supple. No JVD present. Carotid bruit is not present. No thyromegaly present.  Cardiovascular: Normal rate, regular rhythm, normal heart sounds and intact distal pulses.  Exam reveals no gallop.   Pulmonary/Chest: Effort normal and breath sounds normal. No respiratory distress. She has no wheezes. She exhibits no tenderness.  Abdominal: Soft. Bowel sounds are normal. She exhibits no distension, no abdominal bruit and no mass. There is no tenderness.  Genitourinary: No breast swelling, tenderness, discharge or bleeding.  Breast exam: No mass, nodules, thickening, tenderness, bulging, retraction, inflamation, nipple discharge or skin changes noted.  No axillary or clavicular LA.      Musculoskeletal: Normal range of motion. She exhibits no edema and no tenderness.  Lymphadenopathy:  She has no cervical adenopathy.  Neurological: She is alert. She has normal reflexes. No cranial nerve deficit. She exhibits normal muscle tone. Coordination normal.  Skin: Skin is warm and dry. No rash noted. No erythema. No pallor.  Psychiatric: She has a normal mood and affect.          Assessment & Plan:   Problem List Items Addressed This Visit     Cardiovascular and Mediastinum   HTN (hypertension)      bp in fair control at this time  BP Readings from Last 1 Encounters:  06/18/14 130/74   No changes needed Disc lifstyle change with low sodium diet and exercise  Labs reviewed today    Relevant Medications      valsartan-hydrochlorothiazide (DIOVAN-HCT) 80-12.5 MG per tablet      atorvastatin (LIPITOR) tablet      furosemide (LASIX) tablet     Endocrine   Diabetes type 2, controlled      Lab Results  Component Value Date   HGBA1C 6.4 06/11/2014   slt improved Stressed imp of wt loss  Will schedule her own eye appt     Relevant Medications      valsartan-hydrochlorothiazide (DIOVAN-HCT) 80-12.5 MG per tablet      atorvastatin (LIPITOR) tablet     Other   Obesity      Discussed how this problem influences overall health and the risks it imposes  Reviewed plan for weight loss with lower calorie diet (via better food choices and also portion control or program like weight watchers) and exercise building up to or more than 30 minutes 5 days per week including some aerobic activity       HLD (hyperlipidemia)     HDL is a bit lower Disc goals for lipids and reasons to control them Rev labs with pt Rev low sat fat diet in detail     Relevant Medications      valsartan-hydrochlorothiazide (DIOVAN-HCT) 80-12.5 MG per tablet      atorvastatin (LIPITOR) tablet      furosemide (LASIX) tablet   Encounter for Medicare annual wellness exam - Primary     Reviewed health habits including diet and exercise and skin cancer prevention Reviewed appropriate screening tests for age  Also reviewed health mt list, fam hx and immunization status , as well as social and family history   See HPI Labs rev in detail prevnar vaccine today She will schedule own mammogram      Other Visit Diagnoses   Need for vaccination with 13-polyvalent pneumococcal conjugate vaccine        Relevant Orders       Pneumococcal conjugate vaccine 13-valent (Completed)

## 2014-06-18 NOTE — Assessment & Plan Note (Signed)
Discussed how this problem influences overall health and the risks it imposes  Reviewed plan for weight loss with lower calorie diet (via better food choices and also portion control or program like weight watchers) and exercise building up to or more than 30 minutes 5 days per week including some aerobic activity    

## 2014-06-18 NOTE — Patient Instructions (Signed)
Don't forget to get your mammogram next month Don't forget to get your annual eye exam  prevnar vaccine today  Keep working on healthy diet and as much exercise as you can get  Omega 3 supplement - (like fish oil) can help raise good cholesterol

## 2014-06-18 NOTE — Assessment & Plan Note (Signed)
bp in fair control at this time  BP Readings from Last 1 Encounters:  06/18/14 130/74   No changes needed Disc lifstyle change with low sodium diet and exercise  Labs reviewed today

## 2014-07-24 ENCOUNTER — Ambulatory Visit: Payer: Self-pay | Admitting: Family Medicine

## 2014-07-25 ENCOUNTER — Encounter: Payer: Self-pay | Admitting: Family Medicine

## 2014-07-30 ENCOUNTER — Encounter: Payer: Self-pay | Admitting: *Deleted

## 2014-08-12 ENCOUNTER — Other Ambulatory Visit: Payer: Self-pay | Admitting: Family Medicine

## 2014-10-08 ENCOUNTER — Other Ambulatory Visit: Payer: Self-pay | Admitting: Family Medicine

## 2014-12-23 NOTE — Consult Note (Signed)
Chief Complaint:   Subjective/Chief Complaint Please see full colonoscopy report.  Impression of colonoscopy is episode of ischemic colitis.  Biopsies taken, Overall patient is feeling better with less abd pain than several days ago when she had acute sx and no rectal bleeding since yesterday.  CTA done today to check patentcy of mesenterics vessels, discussed with radiology, preliminary negative for significant vascular blockages.  I have obtained several labs to rule out other clotting d/o.   Brief Assessment:   Cardiac Regular    Respiratory clear BS    Gastrointestinal details normal Soft  Nontender  Nondistended  No masses palpable  Bowel sounds normal   Routine Chem:  24-May-13 11:33    BUN 12   Creatinine (comp) 0.77   eGFR (African American) >60   eGFR (Non-African American) >60   Assessment/Plan:  Assessment/Plan:   Assessment 1) Colitis, likely ischemic.  biopsies pending.  CTA negative prelim for significant blockages.    Plan 1) full liquids for 3 days, low residue for 5 days. no heavy lifting.  fu with me outpatient in 5-6 days.  If she has any change, fever, bleeding, increased pain etc she is to return to the ER.   Electronic Signatures: Loistine Simas (MD)  (Signed [phone] 14:55)  Authored: Chief Complaint, Brief Assessment, Lab Results, Assessment/Plan   Last Updated: 24-May-13 14:55 by Loistine Simas (MD)

## 2015-05-01 ENCOUNTER — Ambulatory Visit (INDEPENDENT_AMBULATORY_CARE_PROVIDER_SITE_OTHER): Payer: PPO | Admitting: Family Medicine

## 2015-05-01 ENCOUNTER — Encounter: Payer: Self-pay | Admitting: Family Medicine

## 2015-05-01 VITALS — BP 126/82 | HR 60 | Temp 98.3°F | Ht 65.0 in | Wt 237.8 lb

## 2015-05-01 DIAGNOSIS — I872 Venous insufficiency (chronic) (peripheral): Secondary | ICD-10-CM

## 2015-05-01 DIAGNOSIS — R609 Edema, unspecified: Secondary | ICD-10-CM

## 2015-05-01 DIAGNOSIS — M25473 Effusion, unspecified ankle: Secondary | ICD-10-CM

## 2015-05-01 NOTE — Progress Notes (Signed)
Pre visit review using our clinic review tool, if applicable. No additional management support is needed unless otherwise documented below in the visit note. 

## 2015-05-01 NOTE — Patient Instructions (Signed)
I think you have poor venous return from varicose veins (venous insufficiency) -worse in the left ankle that makes it swell  Wearing support stockings during the day will help  Also watch sodium in diet  Weight loss helps too  Drink lots of water  When sitting elevate feet  Try not to stand without walking   If no improvement or if worse or no symptoms please let me know

## 2015-05-01 NOTE — Progress Notes (Signed)
Subjective:    Patient ID: Hannah Duran, female    DOB: 02/18/1934, 81 y.o.   MRN: [id number]  HPI Here with swelling in L ankle Not painful Going on all year   Lost her husband - was sitting with him for 2 months   Also has had dental work done   Does not wear support stockings  No ankle injury   Seems to be better today  Is getting up on her feet a bit more   On lasix 20 and on hct 12.5  Not eating a lot of salty foods   Is drinking a lot of water   Patient Active Problem List   Diagnosis Date Noted  . Ankle edema 05/01/2015  . Venous (peripheral) insufficiency 05/01/2015  . Cutaneous skin tags 05/23/2014  . Skin lesion of face 05/23/2014  . Fungal nail infection 09/05/2013  . Encounter for Medicare annual wellness exam 06/02/2013  . Mucous cyst of finger 11/30/2012  . Rectal pain 06/27/2012  . Internal hemorrhoid 06/21/2012  . Caregiver with fatigue 06/01/2012  . Other screening mammogram 12/01/2010  . Obesity   . HTN (hypertension)   . HLD (hyperlipidemia)   . Diabetes type 2, controlled   . ROTATOR CUFF SYNDROME, RIGHT 09/25/2009   Past Medical History  Diagnosis Date  . Obesity   . HTN (hypertension)   . HLD (hyperlipidemia)   . Hyperglycemia   . Ischemic colitis 7/13    on colonoscopy  . Congenital foot deformity   . Dermatophytosis   . Rotator cuff syndrome    Past Surgical History  Procedure Laterality Date  . Total abdominal hysterectomy  1980's    due to menometrorrhagia  . Humerus fracture surgery  10/1997   Social History  Substance Use Topics  . Smoking status: Never Smoker   . Smokeless tobacco: Never Used  . Alcohol Use: No   Family History  Problem Relation Age of Onset  . Cancer Father     bladder  . Stroke Mother   . Parkinsonism Mother   . Dementia Mother   . Hypertension Mother    Allergies  Allergen Reactions  . Penicillins     REACTION: Rash  . Sulfamethoxazole-Trimethoprim    Current Outpatient  Prescriptions on File Prior to Visit  Medication Sig Dispense Refill  . atorvastatin (LIPITOR) 10 MG tablet Take 1 tablet (10 mg total) by mouth daily. 30 tablet 11  . atorvastatin (LIPITOR) 10 MG tablet TAKE ONE TABLET BY MOUTH ONCE DAILY 30 tablet 5  . furosemide (LASIX) 20 MG tablet TAKE ONE TABLET BY MOUTH ONCE DAILY 30 tablet 10  . valsartan-hydrochlorothiazide (DIOVAN-HCT) 80-12.5 MG per tablet Take 1 tablet by mouth daily. 30 tablet 11   No current facility-administered medications on file prior to visit.     Review of Systems    Review of Systems  Constitutional: Negative for fever, appetite change, fatigue and unexpected weight change.  Eyes: Negative for pain and visual disturbance.  Respiratory: Negative for cough and shortness of breath.   Cardiovascular: Negative for cp or palpitations   neg for PND or orthopnea  Gastrointestinal: Negative for nausea, diarrhea and constipation.  Genitourinary: Negative for urgency and frequency.  Skin: Negative for pallor or rash   Neurological: Negative for weakness, light-headedness, numbness and headaches.  Hematological: Negative for adenopathy. Does not bruise/bleed easily.  Psychiatric/Behavioral: Negative for dysphoric mood. The patient is not nervous/anxious.  pos for grief after loosing husband     Objective:  Physical Exam  Constitutional: She appears well-developed and well-nourished. No distress.  HENT:  Head: Normocephalic and atraumatic.  Mouth/Throat: Oropharynx is clear and moist.  Eyes: Conjunctivae and EOM are normal. Pupils are equal, round, and reactive to light.  Neck: Normal range of motion. Neck supple. No JVD present. Carotid bruit is not present. No thyromegaly present.  Cardiovascular: Normal rate, regular rhythm, normal heart sounds and intact distal pulses.  Exam reveals no gallop.   Pulmonary/Chest: Effort normal and breath sounds normal. No respiratory distress. She has no wheezes. She has no rales.  No  crackles  Abdominal: Soft. Bowel sounds are normal. She exhibits no distension, no abdominal bruit and no mass. There is no tenderness.  Musculoskeletal: She exhibits edema. She exhibits no tenderness.  L mild pedal edema - tr to plus one with slt pitting  bilat varicosities foot to calf w/o skin change or tenderness No palp cords  Neg homans sign   No erythema or signs of infection   Lymphadenopathy:    She has no cervical adenopathy.  Neurological: She is alert. She has normal reflexes. No cranial nerve deficit. She exhibits normal muscle tone. Coordination normal.  Skin: Skin is warm and dry. No rash noted. No erythema. No pallor.  Psychiatric: She has a normal mood and affect.          Assessment & Plan:   Problem List Items Addressed This Visit    Ankle edema - Primary    Acute on chronic worse on L from venous insuff (worse with prolonged sitting and travel)  See assessment for venous insuff  Will try supp stockings /elevation / wt loss/ low na diet and update       Venous (peripheral) insufficiency    Causing bothersome edema in L ankle -acute on chronic  Worse after prolonged sitting Also obese  No skin changes or ulceration  Px for supp stockings to the knee given to use when awake and up Disc low sodium diet and inc water intake  Also wt loss Elevation of feet when sitting as much as possible   Update if worse or no improvement

## 2015-05-02 NOTE — Assessment & Plan Note (Signed)
Causing bothersome edema in L ankle -acute on chronic  Worse after prolonged sitting Also obese  No skin changes or ulceration  Px for supp stockings to the knee given to use when awake and up Disc low sodium diet and inc water intake  Also wt loss Elevation of feet when sitting as much as possible   Update if worse or no improvement

## 2015-05-02 NOTE — Assessment & Plan Note (Signed)
Acute on chronic worse on L from venous insuff (worse with prolonged sitting and travel)  See assessment for venous insuff  Will try supp stockings /elevation / wt loss/ low na diet and update

## 2015-07-02 ENCOUNTER — Other Ambulatory Visit: Payer: Self-pay | Admitting: Family Medicine

## 2015-07-10 ENCOUNTER — Ambulatory Visit (INDEPENDENT_AMBULATORY_CARE_PROVIDER_SITE_OTHER): Payer: PPO

## 2015-07-10 DIAGNOSIS — Z23 Encounter for immunization: Secondary | ICD-10-CM

## 2015-09-24 ENCOUNTER — Other Ambulatory Visit: Payer: Self-pay | Admitting: Family Medicine

## 2015-10-02 ENCOUNTER — Encounter: Payer: Self-pay | Admitting: Family Medicine

## 2015-10-03 ENCOUNTER — Encounter: Payer: Self-pay | Admitting: Family Medicine

## 2015-10-04 ENCOUNTER — Ambulatory Visit: Payer: PPO | Admitting: Family Medicine

## 2015-10-11 ENCOUNTER — Encounter: Payer: Self-pay | Admitting: Family Medicine

## 2015-10-11 ENCOUNTER — Ambulatory Visit (INDEPENDENT_AMBULATORY_CARE_PROVIDER_SITE_OTHER): Payer: PPO | Admitting: Family Medicine

## 2015-10-11 VITALS — BP 128/64 | HR 59 | Temp 97.4°F | Ht 65.0 in | Wt 235.5 lb

## 2015-10-11 DIAGNOSIS — E119 Type 2 diabetes mellitus without complications: Secondary | ICD-10-CM

## 2015-10-11 DIAGNOSIS — I10 Essential (primary) hypertension: Secondary | ICD-10-CM

## 2015-10-11 DIAGNOSIS — E669 Obesity, unspecified: Secondary | ICD-10-CM | POA: Diagnosis not present

## 2015-10-11 DIAGNOSIS — E785 Hyperlipidemia, unspecified: Secondary | ICD-10-CM | POA: Diagnosis not present

## 2015-10-11 LAB — COMPREHENSIVE METABOLIC PANEL
ALK PHOS: 102 U/L (ref 39–117)
ALT: 16 U/L (ref 0–35)
AST: 22 U/L (ref 0–37)
Albumin: 4.1 g/dL (ref 3.5–5.2)
BUN: 18 mg/dL (ref 6–23)
CO2: 34 meq/L — AB (ref 19–32)
Calcium: 9.7 mg/dL (ref 8.4–10.5)
Chloride: 99 mEq/L (ref 96–112)
Creatinine, Ser: 0.76 mg/dL (ref 0.40–1.20)
GFR: 77.93 mL/min (ref >60.00)
GLUCOSE: 108 mg/dL — AB (ref 70–99)
POTASSIUM: 3.5 meq/L (ref 3.5–5.1)
Sodium: 140 mEq/L (ref 135–145)
Total Bilirubin: 0.7 mg/dL (ref 0.2–1.2)
Total Protein: 7.1 g/dL (ref 6.0–8.3)

## 2015-10-11 LAB — TSH: TSH: 2.54 u[IU]/mL (ref 0.35–4.50)

## 2015-10-11 NOTE — Progress Notes (Signed)
Subjective:    Patient ID: Hannah Duran, female    DOB: August 26, 1934, 82 y.o.   MRN: [id number]  HPI Here to review results from a health fair  Total cholesterol is 141 Triglycerides are 164   (hx of hyperglycemia) LDL 72  HDL 36  TC/HDL ratio 3.9   Last check here Lab Results  Component Value Date   CHOL 151 06/11/2014   HDL 30.80* 06/11/2014   LDLCALC 93 06/11/2014   TRIG 137.0 06/11/2014   CHOLHDL 5 06/11/2014   On lipitor and diet   Diet -eating less / eating healthier- includes vegetables and salads  Occasional sweets-not a lot  No sugar drinks -all calorie free  Meat is usually chicken , some nuts , does not like milk   (occ ice cream)  Exercise -more now that husband has died , also cleaning , housework   Glucose control  Lab Results  Component Value Date   HGBA1C 6.4 06/11/2014   was 5.4 with the health fair   Wt is down 2 lb  bmi 69   bmi at health fair 38.7   bp is stable today  No cp or palpitations or headaches or edema  No side effects to medicines  BP Readings from Last 3 Encounters:  10/11/15 128/64  05/01/15 126/82  06/18/14 130/74     Was a little higher at the health fair   Patient Active Problem List   Diagnosis Date Noted  . Ankle edema 05/01/2015  . Venous (peripheral) insufficiency 05/01/2015  . Cutaneous skin tags 05/23/2014  . Skin lesion of face 05/23/2014  . Fungal nail infection 09/05/2013  . Encounter for Medicare annual wellness exam 06/02/2013  . Mucous cyst of finger 11/30/2012  . Internal hemorrhoid 06/21/2012  . Obesity   . HTN (hypertension)   . HLD (hyperlipidemia)   . Diabetes type 2, controlled [facility])    Past Medical History  Diagnosis Date  . Obesity   . HTN (hypertension)   . HLD (hyperlipidemia)   . Hyperglycemia   . Ischemic colitis ([place]) 7/13    on colonoscopy  . Congenital foot deformity   . Dermatophytosis   . Rotator cuff syndrome    Past Surgical History  Procedure Laterality Date  .  Total abdominal hysterectomy  1980's    due to menometrorrhagia  . Humerus fracture surgery  10/1997   Social History  Substance Use Topics  . Smoking status: Never Smoker   . Smokeless tobacco: Never Used  . Alcohol Use: No   Family History  Problem Relation Age of Onset  . Cancer Father     bladder  . Stroke Mother   . Parkinsonism Mother   . Dementia Mother   . Hypertension Mother    Allergies  Allergen Reactions  . Penicillins     REACTION: Rash  . Sulfamethoxazole-Trimethoprim    Current Outpatient Prescriptions on File Prior to Visit  Medication Sig Dispense Refill  . atorvastatin (LIPITOR) 10 MG tablet TAKE ONE TABLET BY MOUTH ONCE DAILY 30 tablet 5  . furosemide (LASIX) 20 MG tablet TAKE ONE TABLET BY MOUTH ONCE DAILY 30 tablet 0  . valsartan-hydrochlorothiazide (DIOVAN-HCT) 80-12.5 MG tablet TAKE ONE TABLET BY MOUTH ONCE DAILY 30 tablet 3   No current facility-administered medications on file prior to visit.     Review of Systems Review of Systems  Constitutional: Negative for fever, appetite change, fatigue and unexpected weight change.  Eyes: Negative for pain and visual disturbance.  Respiratory: Negative for cough and shortness of breath.   Cardiovascular: Negative for cp or palpitations    Gastrointestinal: Negative for nausea, diarrhea and constipation.  Genitourinary: Negative for urgency and frequency.  Skin: Negative for pallor or rash   Neurological: Negative for weakness, light-headedness, numbness and headaches.  Hematological: Negative for adenopathy. Does not bruise/bleed easily.  Psychiatric/Behavioral: Negative for dysphoric mood. The patient is not nervous/anxious.         Objective:   Physical Exam  Constitutional: She appears well-developed and well-nourished. No distress.  obese and well appearing   HENT:  Head: Normocephalic and atraumatic.  Mouth/Throat: Oropharynx is clear and moist.  Eyes: Conjunctivae and EOM are normal. Pupils  are equal, round, and reactive to light.  Neck: Normal range of motion. Neck supple. No JVD present. Carotid bruit is not present. No thyromegaly present.  Cardiovascular: Normal rate, regular rhythm, normal heart sounds and intact distal pulses.  Exam reveals no gallop.   Pulmonary/Chest: Effort normal and breath sounds normal. No respiratory distress. She has no wheezes. She has no rales.  No crackles  Abdominal: Soft. Bowel sounds are normal. She exhibits no distension, no abdominal bruit and no mass. There is no tenderness.  Musculoskeletal: She exhibits no edema.  Lymphadenopathy:    She has no cervical adenopathy.  Neurological: She is alert. She has normal reflexes.  Skin: Skin is warm and dry. No rash noted.  Psychiatric: She has a normal mood and affect.          Assessment & Plan:   Problem List Items Addressed This Visit      Cardiovascular and Mediastinum   HTN (hypertension) - Primary    bp in fair control at this time  BP Readings from Last 1 Encounters:  10/11/15 128/64   No changes needed Disc lifstyle change with low sodium diet and exercise  Labs today       Relevant Orders   Comprehensive metabolic panel (Completed)   TSH (Completed)   CBC with Differential/Platelet (Completed)     Endocrine   Diabetes type 2, controlled ([place])    A1C was low at the health fair 5.4  Diet improved No meds Enc low glycemic diet and also wt loss and exercise  Re check 6 mo         Other   HLD (hyperlipidemia)    Lipids are improved at the health fair Disc goals for lipids and reasons to control them Rev labs with pt Rev low sat fat diet in detail Enc her to keep up the good diet changes       Obesity    Discussed how this problem influences overall health and the risks it imposes  Reviewed plan for weight loss with lower calorie diet (via better food choices and also portion control or program like weight watchers) and exercise building up to or more than 30  minutes 5 days per week including some aerobic activity

## 2015-10-11 NOTE — Progress Notes (Signed)
Pre visit review using our clinic review tool, if applicable. No additional management support is needed unless otherwise documented below in the visit note. 

## 2015-10-11 NOTE — Patient Instructions (Signed)
Work on [employer] loss with healthy diet  Keep exercising  Cholesterol and blood sugar control is improved Other tests are ok  Labs for hypertension today  Take care of yourself

## 2015-10-13 LAB — CBC WITH DIFFERENTIAL/PLATELET
BASOS ABS: 0 10*3/uL (ref 0.0–0.1)
Basophils Relative: 0.4 % (ref 0.0–3.0)
Eosinophils Absolute: 0.2 10*3/uL (ref 0.0–0.7)
Eosinophils Relative: 2.4 % (ref 0.0–5.0)
HEMATOCRIT: 44.3 % (ref 36.0–46.0)
Hemoglobin: 13.4 g/dL (ref 12.0–15.0)
LYMPHS PCT: 21.6 % (ref 12.0–46.0)
Lymphs Abs: 2 10*3/uL (ref 0.7–4.0)
MCHC: 30.2 g/dL (ref 30.0–36.0)
MCV: 95.5 fl (ref 78.0–100.0)
MONOS PCT: 8.7 % (ref 3.0–12.0)
Monocytes Absolute: 0.8 10*3/uL (ref 0.1–1.0)
NEUTROS ABS: 6.3 10*3/uL (ref 1.4–7.7)
Neutrophils Relative %: 66 % (ref 43.0–77.0)
PLATELETS: 337 10*3/uL (ref 150.0–400.0)
RBC: 4.64 Mil/uL (ref 3.87–5.11)
RDW: 14.4 % (ref 11.5–15.5)
WBC: 9.4 10*3/uL (ref 4.0–10.5)

## 2015-10-13 NOTE — Assessment & Plan Note (Signed)
bp in fair control at this time  BP Readings from Last 1 Encounters:  10/11/15 128/64   No changes needed Disc lifstyle change with low sodium diet and exercise  Labs today

## 2015-10-13 NOTE — Assessment & Plan Note (Signed)
Discussed how this problem influences overall health and the risks it imposes  Reviewed plan for weight loss with lower calorie diet (via better food choices and also portion control or program like weight watchers) and exercise building up to or more than 30 minutes 5 days per week including some aerobic activity    

## 2015-10-13 NOTE — Assessment & Plan Note (Signed)
A1C was low at the health fair 5.4  Diet improved No meds Enc low glycemic diet and also wt loss and exercise  Re check 6 mo

## 2015-10-13 NOTE — Assessment & Plan Note (Signed)
Lipids are improved at the health fair Disc goals for lipids and reasons to control them Rev labs with pt Rev low sat fat diet in detail Enc her to keep up the good diet changes

## 2015-10-14 ENCOUNTER — Encounter: Payer: Self-pay | Admitting: *Deleted

## 2015-12-03 ENCOUNTER — Other Ambulatory Visit: Payer: Self-pay | Admitting: Family Medicine

## 2016-01-14 ENCOUNTER — Other Ambulatory Visit: Payer: Self-pay | Admitting: Family Medicine

## 2016-03-11 ENCOUNTER — Other Ambulatory Visit: Payer: Self-pay | Admitting: Family Medicine

## 2016-04-23 ENCOUNTER — Telehealth: Payer: Self-pay | Admitting: Family Medicine

## 2016-04-23 NOTE — Telephone Encounter (Signed)
Called pt - needs to schedule awv before 09/30 Left message

## 2016-04-30 ENCOUNTER — Ambulatory Visit: Payer: PPO

## 2016-05-14 ENCOUNTER — Ambulatory Visit (INDEPENDENT_AMBULATORY_CARE_PROVIDER_SITE_OTHER): Payer: PPO

## 2016-05-14 ENCOUNTER — Other Ambulatory Visit (INDEPENDENT_AMBULATORY_CARE_PROVIDER_SITE_OTHER): Payer: PPO

## 2016-05-14 VITALS — BP 122/76 | HR 56 | Temp 97.9°F | Ht 64.5 in | Wt 242.5 lb

## 2016-05-14 DIAGNOSIS — R7309 Other abnormal glucose: Secondary | ICD-10-CM

## 2016-05-14 DIAGNOSIS — E119 Type 2 diabetes mellitus without complications: Secondary | ICD-10-CM

## 2016-05-14 DIAGNOSIS — E785 Hyperlipidemia, unspecified: Secondary | ICD-10-CM

## 2016-05-14 DIAGNOSIS — Z0000 Encounter for general adult medical examination without abnormal findings: Secondary | ICD-10-CM | POA: Diagnosis not present

## 2016-05-14 LAB — LIPID PANEL
CHOLESTEROL: 146 mg/dL (ref 0–200)
HDL: 35.1 mg/dL — AB (ref >39.00)
LDL Cholesterol: 85 mg/dL (ref 0–99)
NonHDL: 111
TRIGLYCERIDES: 132 mg/dL (ref 0.0–149.0)
Total CHOL/HDL Ratio: 4
VLDL: 26.4 mg/dL (ref 0.0–40.0)

## 2016-05-14 LAB — COMPREHENSIVE METABOLIC PANEL
ALT: 15 U/L (ref 0–35)
AST: 17 U/L (ref 0–37)
Albumin: 3.9 g/dL (ref 3.5–5.2)
Alkaline Phosphatase: 95 U/L (ref 39–117)
BUN: 19 mg/dL (ref 6–23)
CALCIUM: 9 mg/dL (ref 8.4–10.5)
CHLORIDE: 103 meq/L (ref 96–112)
CO2: 30 meq/L (ref 19–32)
CREATININE: 0.76 mg/dL (ref 0.40–1.20)
GFR: 77.81 mL/min (ref >60.00)
Glucose, Bld: 95 mg/dL (ref 70–99)
POTASSIUM: 3.8 meq/L (ref 3.5–5.1)
Sodium: 138 mEq/L (ref 135–145)
TOTAL PROTEIN: 6.6 g/dL (ref 6.0–8.3)
Total Bilirubin: 0.7 mg/dL (ref 0.2–1.2)

## 2016-05-14 LAB — HEMOGLOBIN A1C: HEMOGLOBIN A1C: 6.5 % (ref 4.6–6.5)

## 2016-05-14 NOTE — Progress Notes (Signed)
Pre visit review using our clinic review tool, if applicable. No additional management support is needed unless otherwise documented below in the visit note. 

## 2016-05-14 NOTE — Progress Notes (Signed)
PCP notes:   Health maintenance:  Flu vaccine - pt will take vaccine at a future date Eye exam - pt is trying to find a new doctor Mammogram - pt would like to complete mammograms every 2 yrs  Abnormal screenings:   Hearing - failed  Patient concerns:   None  Nurse concerns:  None  Next PCP appt:   05/19/16 @ 1215

## 2016-05-14 NOTE — Patient Instructions (Signed)
Ms. Selvaggio , Thank you for taking time to come for your Medicare Wellness Visit. I appreciate your ongoing commitment to your health goals. Please review the following plan we discussed and let me know if I can assist you in the future.   These are the goals we discussed: Goals    . Increase water intake          Starting 05/14/2016, I will attempt to drink at least 6-8 glasses of water daily.        This is a list of the screening recommended for you and due dates:  Health Maintenance  Topic Date Due  . Flu Shot  08/30/2016*  . Eye exam for diabetics  05/14/2017*  . Mammogram  07/24/2017*  . Complete foot exam   10/10/2016  . Hemoglobin A1C  11/11/2016  . Tetanus Vaccine  11/30/2020  . DEXA scan (bone density measurement)  Completed  . Shingles Vaccine  Addressed  . Pneumonia vaccines  Completed  *Topic was postponed. The date shown is not the original due date.   Preventive Care for Adults  A healthy lifestyle and preventive care can promote health and wellness. Preventive health guidelines for adults include the following key practices.  . A routine yearly physical is a good way to check with your health care provider about your health and preventive screening. It is a chance to share any concerns and updates on your health and to receive a thorough exam.  . Visit your dentist for a routine exam and preventive care every 6 months. Brush your teeth twice a day and floss once a day. Good oral hygiene prevents tooth decay and gum disease.  . The frequency of eye exams is based on your age, health, family medical history, use  of contact lenses, and other factors. Follow your health care provider's ecommendations for frequency of eye exams.  . Eat a healthy diet. Foods like vegetables, fruits, whole grains, low-fat dairy products, and lean protein foods contain the nutrients you need without too many calories. Decrease your intake of foods high in solid fats, added sugars, and  salt. Eat the right amount of calories for you. Get information about a proper diet from your health care provider, if necessary.  . Regular physical exercise is one of the most important things you can do for your health. Most adults should get at least 150 minutes of moderate-intensity exercise (any activity that increases your heart rate and causes you to sweat) each week. In addition, most adults need muscle-strengthening exercises on 2 or more days a week.  Silver Sneakers may be a benefit available to you. To determine eligibility, you may visit the website: www.silversneakers.com or contact program at [phone] Mon-Fri between 8AM-8PM.   . Maintain a healthy weight. The body mass index (BMI) is a screening tool to identify possible weight problems. It provides an estimate of body fat based on height and weight. Your health care provider can find your BMI and can help you achieve or maintain a healthy weight.   For adults 20 years and older: ? A BMI below 18.5 is considered underweight. ? A BMI of 18.5 to 24.9 is normal. ? A BMI of 25 to 29.9 is considered overweight. ? A BMI of 30 and above is considered obese.   . Maintain normal blood lipids and cholesterol levels by exercising and minimizing your intake of saturated fat. Eat a balanced diet with plenty of fruit and vegetables. Blood tests for lipids and cholesterol  should begin at age 27 and be repeated every 5 years. If your lipid or cholesterol levels are high, you are over 50, or you are at high risk for heart disease, you may need your cholesterol levels checked more frequently. Ongoing high lipid and cholesterol levels should be treated with medicines if diet and exercise are not working.  . If you smoke, find out from your health care provider how to quit. If you do not use tobacco, please do not start.  . If you choose to drink alcohol, please do not consume more than 2 drinks per day. One drink is considered to be 12 ounces  (355 mL) of beer, 5 ounces (148 mL) of wine, or 1.5 ounces (44 mL) of liquor.  . If you are 92-67 years old, ask your health care provider if you should take aspirin to prevent strokes.  . Use sunscreen. Apply sunscreen liberally and repeatedly throughout the day. You should seek shade when your shadow is shorter than you. Protect yourself by wearing long sleeves, pants, a wide-brimmed hat, and sunglasses year round, whenever you are outdoors.  . Once a month, do a whole body skin exam, using a mirror to look at the skin on your back. Tell your health care provider of new moles, moles that have irregular borders, moles that are larger than a pencil eraser, or moles that have changed in shape or color.

## 2016-05-14 NOTE — Progress Notes (Signed)
Subjective:   Hannah Duran is a 82 y.o. female who presents for Medicare Annual (Subsequent) preventive examination.  Review of Systems:  N/A Cardiac Risk Factors include: advanced age (>25men, >50 women);obesity (BMI >30kg/m2);diabetes mellitus;dyslipidemia;hypertension     Objective:     Vitals: BP 122/76 (BP Location: Left Arm, Patient Position: Sitting, Cuff Size: Normal)   Pulse (!) 56   Temp 97.9 F (36.6 C) (Oral)   Ht 5' 4.5" (1.638 m)   Wt 242 lb 8 oz (110 kg)   LMP 08/31/1980   SpO2 97%   BMI 40.98 kg/m   Body mass index is 40.98 kg/m.   Tobacco History  Smoking Status  . Never Smoker  Smokeless Tobacco  . Never Used     Counseling given: No   Past Medical History:  Diagnosis Date  . Congenital foot deformity   . Dermatophytosis   . HLD (hyperlipidemia)   . HTN (hypertension)   . Hyperglycemia   . Ischemic colitis ([place]) 7/13   on colonoscopy  . Obesity   . Rotator cuff syndrome    Past Surgical History:  Procedure Laterality Date  . HUMERUS FRACTURE SURGERY  10/1997  . TOTAL ABDOMINAL HYSTERECTOMY  1980's   due to menometrorrhagia   Family History  Problem Relation Age of Onset  . Stroke Mother   . Parkinsonism Mother   . Dementia Mother   . Hypertension Mother   . Cancer Father     bladder   History  Sexual Activity  . Sexual activity: No    Outpatient Encounter Prescriptions as of 05/14/2016  Medication Sig  . atorvastatin (LIPITOR) 10 MG tablet TAKE ONE TABLET BY MOUTH ONCE DAILY  . furosemide (LASIX) 20 MG tablet TAKE ONE TABLET BY MOUTH ONCE DAILY  . valsartan-hydrochlorothiazide (DIOVAN-HCT) 80-12.5 MG tablet TAKE ONE TABLET BY MOUTH ONCE DAILY   No facility-administered encounter medications on file as of 05/14/2016.     Activities of Daily Living In your present state of health, do you have any difficulty performing the following activities: 05/14/2016  Hearing? Y  Vision? N  Difficulty concentrating or making  decisions? N  Walking or climbing stairs? N  Dressing or bathing? N  Doing errands, shopping? N  Preparing Food and eating ? N  Using the Toilet? N  In the past six months, have you accidently leaked urine? N  Do you have problems with loss of bowel control? N  Managing your Medications? N  Managing your Finances? N  Housekeeping or managing your Housekeeping? N  Some recent data might be hidden    Patient Care Team: Abner Greenspan, MD as PCP - General Oneta Rack, MD as Consulting Physician (Dermatology)    Assessment:     Hearing Screening   125Hz  250Hz  500Hz  1000Hz  2000Hz  3000Hz  4000Hz  6000Hz  8000Hz   Right ear:   40 0 40  0    Left ear:   0 0 40  0      Visual Acuity Screening   Right eye Left eye Both eyes  Without correction: 20/70 20/70 20/50   With correction:       Exercise Activities and Dietary recommendations Current Exercise Habits: Home exercise routine, Type of exercise: Other - see comments;walking (yard work, grass cutting), Time (Minutes): 30, Frequency (Times/Week): 7, Weekly Exercise (Minutes/Week): 210, Intensity: Mild, Exercise limited by: None identified  Goals    . Increase water intake          Starting 05/14/2016, I will  attempt to drink at least 6-8 glasses of water daily.       Fall Risk Fall Risk  05/14/2016 06/18/2014 06/02/2013  Falls in the past year? No No No   Depression Screen PHQ 2/9 Scores 05/14/2016 06/18/2014 06/02/2013 06/01/2012  PHQ - 2 Score 0 0 0 0     Cognitive Testing MMSE - Mini Mental State Exam 05/14/2016  Orientation to time 5  Orientation to Place 5  Registration 3  Attention/ Calculation 0  Recall 3  Language- name 2 objects 0  Language- repeat 1  Language- follow 3 step command 3  Language- read & follow direction 0  Write a sentence 0  Copy design 0  Total score 20   PLEASE NOTE: A Mini-Cog screen was completed. Maximum score is 20. A value of 0 denotes this part of [place] MMSE was not completed or the  patient failed this part of the Mini-Cog screening.   Mini-Cog Screening Orientation to Time - Max 5 pts Orientation to Place - Max 5 pts Registration - Max 3 pts Recall - Max 3 pts Language Repeat - Max 1 pts Language Follow 3 Step Command - Max 3 pts   Immunization History  Administered Date(s) Administered  . Influenza Split 06/04/2011, 05/18/2012  . Influenza Whole 06/24/2007, 06/05/2008, 06/05/2009, 05/22/2010  . Influenza,inj,Quad PF,36+ Mos 05/17/2013, 05/22/2014, 07/10/2015  . Pneumococcal Conjugate-13 06/18/2014  . Pneumococcal Polysaccharide-23 11/20/2009  . Td 06/22/2001  . Tdap 12/01/2010   Screening Tests Health Maintenance  Topic Date Due  . INFLUENZA VACCINE  08/30/2016 (Originally 03/31/2016)  . OPHTHALMOLOGY EXAM  05/14/2017 (Originally 04/14/1944)  . MAMMOGRAM  07/24/2017 (Originally 07/25/2015)  . FOOT EXAM  10/10/2016  . HEMOGLOBIN A1C  11/11/2016  . TETANUS/TDAP  11/30/2020  . DEXA SCAN  Completed  . ZOSTAVAX  Addressed  . PNA vac Low Risk Adult  Completed      Plan:     I have personally reviewed and addressed the Medicare Annual Wellness questionnaire and have noted the following in the patient's chart:  A. Medical and social history B. Use of alcohol, tobacco or illicit drugs  C. Current medications and supplements D. Functional ability and status E.  Nutritional status F.  Physical activity G. Advance directives H. List of other physicians I.  Hospitalizations, surgeries, and ER visits in previous 12 months J.  [place] to include hearing, vision, cognitive, depression L. Referrals and appointments - none  In addition, I have reviewed and discussed with patient certain preventive protocols, quality metrics, and best practice recommendations. A written personalized care plan for preventive services as well as general preventive health recommendations were provided to patient.  See attached scanned questionnaire for additional  information.   Signed,   Lindell Noe, MHA, BS, LPN Health Advisor

## 2016-05-16 NOTE — Progress Notes (Signed)
I reviewed health advisor's note, was available for consultation, and agree with documentation and plan.  

## 2016-05-19 ENCOUNTER — Ambulatory Visit (INDEPENDENT_AMBULATORY_CARE_PROVIDER_SITE_OTHER): Payer: PPO | Admitting: Family Medicine

## 2016-05-19 ENCOUNTER — Encounter: Payer: Self-pay | Admitting: Family Medicine

## 2016-05-19 VITALS — BP 124/80 | HR 65 | Temp 98.6°F | Ht 64.5 in | Wt 242.2 lb

## 2016-05-19 DIAGNOSIS — E119 Type 2 diabetes mellitus without complications: Secondary | ICD-10-CM | POA: Diagnosis not present

## 2016-05-19 DIAGNOSIS — I10 Essential (primary) hypertension: Secondary | ICD-10-CM

## 2016-05-19 DIAGNOSIS — E785 Hyperlipidemia, unspecified: Secondary | ICD-10-CM

## 2016-05-19 DIAGNOSIS — Z0000 Encounter for general adult medical examination without abnormal findings: Secondary | ICD-10-CM | POA: Insufficient documentation

## 2016-05-19 MED ORDER — VALSARTAN-HYDROCHLOROTHIAZIDE 80-12.5 MG PO TABS: 1.0000 | ORAL_TABLET | Freq: Every day | ORAL | 11 refills | Status: DC

## 2016-05-19 MED ORDER — ATORVASTATIN CALCIUM 10 MG PO TABS: 10.0000 mg | ORAL_TABLET | Freq: Every day | ORAL | 11 refills | Status: DC

## 2016-05-19 MED ORDER — FUROSEMIDE 20 MG PO TABS: 20.0000 mg | ORAL_TABLET | Freq: Every day | ORAL | 11 refills | Status: DC

## 2016-05-19 NOTE — Assessment & Plan Note (Signed)
Controlled with lipitor but HDL is low  Disc imp of exercise / omega 3 to help this  Disc goals for lipids and reasons to control them Rev labs with pt Rev low sat fat diet in detail

## 2016-05-19 NOTE — Assessment & Plan Note (Signed)
bp in fair control at this time  BP Readings from Last 1 Encounters:  05/19/16 124/80   No changes needed Disc lifstyle change with low sodium diet and exercise  Labs rev  Wt loss enc

## 2016-05-19 NOTE — Progress Notes (Signed)
Pre visit review using our clinic review tool, if applicable. No additional management support is needed unless otherwise documented below in the visit note. 

## 2016-05-19 NOTE — Assessment & Plan Note (Signed)
Lab Results  Component Value Date   HGBA1C 6.5 05/14/2016   Currently controlled with diet  Disc imp of low glycemic diet and exercise as tolerated F/u 6 mo  She will schedule her own eye exam

## 2016-05-19 NOTE — Patient Instructions (Addendum)
Do get an eye doctor appt. - let us know if you need a referral  If the hearing problem starts to bother you- we can also refer for that  Don't forget to get your flu shot - don't wait too long  Keep up the walking and increase the number of days per week that you walk  Also get rid of the sodas - except for special occasions  Follow up in 6 months with labs prior

## 2016-05-19 NOTE — Assessment & Plan Note (Signed)
Discussed how this problem influences overall health and the risks it imposes  Reviewed plan for weight loss with lower calorie diet (via better food choices and also portion control or program like weight watchers) and exercise building up to or more than 30 minutes 5 days per week including some aerobic activity    

## 2016-05-19 NOTE — Assessment & Plan Note (Signed)
Reviewed health habits including diet and exercise and skin cancer prevention Reviewed appropriate screening tests for age  Also reviewed health mt list, fam hx and immunization status , as well as social and family history   See HPI Reviewed AM[facility] visit  Labs reviewed Declines flu shot/dexa and colon cancer screening  I encourage a mammogram yearly-she pref every 2 years  Declines zoster vaccine  Do get an eye doctor appt. - let us know if you need a referral  If the hearing problem starts to bother you- we can also refer for that  Don't forget to get your flu shot - don't wait too long  Keep up the walking and increase the number of days per week that you walk  Also get rid of the sodas - except for special occasions  Follow up in 6 months with labs prior

## 2016-05-19 NOTE — Progress Notes (Signed)
Subjective:    Patient ID: Hannah Duran, female    DOB: 03-01-1934, 82 y.o.   MRN: [id number]  HPI Here for health maintenance exam and to review chronic medical problems    Feeling ok overall   AMW with Katha Cabal this month   Failed hearing exam-not really bothering her / she has to occ ask people to repeat themselves  Vision screen-not optimal 20/70 R and L , 20 /50 bilat  She has not found an eye doctor yet - trying to decide where to go  Formerly went to Dr Sandra Cockayne - wants to switched Has to stay with [facility]   Prefers to do mammograms every 2 years  Last one 11/15 Self exam-no lumps or changes   Flu shot- does not want it yet   Declines dexa   Total hysterectomy in 1980s  No gyn issues   Declines zoster vaccine   Declines colon screening of any type  Wt Readings from Last 3 Encounters:  05/19/16 242 lb 4 oz (109.9 kg)  05/14/16 242 lb 8 oz (110 kg)  10/11/15 235 lb 8 oz (106.8 kg)  up 7 lb since February - she has been drinking sodas  No sweet tea  Watches her carbs in general Exercise - walking 2 times per week- for about 30 minutes  bmi is 40.9 Morbid obesity  bp is stable today  No cp or palpitations or headaches or edema  No side effects to medicines  BP Readings from Last 3 Encounters:  05/19/16 124/80  05/14/16 122/76  10/11/15 128/64     Hx of hyperlipidemia Lab Results  Component Value Date   CHOL 146 05/14/2016   CHOL 151 06/11/2014   CHOL 150 05/30/2013   Lab Results  Component Value Date   HDL 35.10 (L) 05/14/2016   HDL 30.80 (L) 06/11/2014   HDL 36.70 (L) 05/30/2013   Lab Results  Component Value Date   LDLCALC 85 05/14/2016   LDLCALC 93 06/11/2014   LDLCALC 89 05/30/2013   Lab Results  Component Value Date   TRIG 132.0 05/14/2016   TRIG 137.0 06/11/2014   TRIG 124.0 05/30/2013   Lab Results  Component Value Date   CHOLHDL 4 05/14/2016   CHOLHDL 5 06/11/2014   CHOLHDL 4 05/30/2013   No results found for:  LDLDIRECT lipitor and diet  In good control  Will continue to walk to get the HDL higher     Chemistry      Component Value Date/Time   NA 138 05/14/2016 1350   NA 138 01/20/2012 1104   K 3.8 05/14/2016 1350   K 3.8 01/20/2012 1104   CL 103 05/14/2016 1350   CL 103 01/20/2012 1104   CO2 30 05/14/2016 1350   CO2 29 01/20/2012 1104   BUN 19 05/14/2016 1350   BUN 12 01/22/2012 1133   CREATININE 0.76 05/14/2016 1350   CREATININE 0.77 01/22/2012 1133      Component Value Date/Time   CALCIUM 9.0 05/14/2016 1350   CALCIUM 8.8 01/20/2012 1104   ALKPHOS 95 05/14/2016 1350   ALKPHOS 84 01/20/2012 1104   AST 17 05/14/2016 1350   AST 30 01/20/2012 1104   ALT 15 05/14/2016 1350   ALT 27 01/20/2012 1104   BILITOT 0.7 05/14/2016 1350   BILITOT 1.1 (H) 01/20/2012 1104      Lab Results  Component Value Date   WBC 9.4 10/11/2015   HGB 13.4 10/11/2015   HCT 44.3 10/11/2015   MCV  95.5 10/11/2015   PLT 337.0 10/11/2015     DM2 Lab Results  Component Value Date   HGBA1C 6.5 05/14/2016   Last time 6.4- stable   no hypoglycemia She will make her own eye exam appt when she decides where to go  Patient Active Problem List   Diagnosis Date Noted  . Ankle edema 05/01/2015  . Venous (peripheral) insufficiency 05/01/2015  . Cutaneous skin tags 05/23/2014  . Skin lesion of face 05/23/2014  . Fungal nail infection 09/05/2013  . Encounter for Medicare annual wellness exam 06/02/2013  . Mucous cyst of finger 11/30/2012  . Internal hemorrhoid 06/21/2012  . Obesity   . HTN (hypertension)   . HLD (hyperlipidemia)   . Diabetes type 2, controlled ([place])    Past Medical History:  Diagnosis Date  . Congenital foot deformity   . Dermatophytosis   . HLD (hyperlipidemia)   . HTN (hypertension)   . Hyperglycemia   . Ischemic colitis ([place]) 7/13   on colonoscopy  . Obesity   . Rotator cuff syndrome    Past Surgical History:  Procedure Laterality Date  . HUMERUS FRACTURE SURGERY   10/1997  . TOTAL ABDOMINAL HYSTERECTOMY  1980's   due to menometrorrhagia   Social History  Substance Use Topics  . Smoking status: Never Smoker  . Smokeless tobacco: Never Used  . Alcohol use No   Family History  Problem Relation Age of Onset  . Stroke Mother   . Parkinsonism Mother   . Dementia Mother   . Hypertension Mother   . Cancer Father     bladder   Allergies  Allergen Reactions  . Penicillins     REACTION: Rash  . Sulfamethoxazole-Trimethoprim    No current outpatient prescriptions on file prior to visit.   No current facility-administered medications on file prior to visit.     Review of Systems Review of Systems  Constitutional: Negative for fever, appetite change,  and unexpected weight change.  Eyes: Negative for pain and visual disturbance.  ENT pos for hearing loss  Respiratory: Negative for cough and shortness of breath.   Cardiovascular: Negative for cp or palpitations    Gastrointestinal: Negative for nausea, diarrhea and constipation.  Genitourinary: Negative for urgency and frequency.  Skin: Negative for pallor or rash   Neurological: Negative for weakness, light-headedness, numbness and headaches.  Hematological: Negative for adenopathy. Does not bruise/bleed easily.  Psychiatric/Behavioral: Negative for dysphoric mood. The patient is not nervous/anxious.         Objective:   Physical Exam  Constitutional: She appears well-developed and well-nourished. No distress.  Morbidly obese and well appearing   HENT:  Head: Normocephalic and atraumatic.  Right Ear: External ear normal.  Left Ear: External ear normal.  Mouth/Throat: Oropharynx is clear and moist.  Eyes: Conjunctivae and EOM are normal. Pupils are equal, round, and reactive to light. No scleral icterus.  Neck: Normal range of motion. Neck supple. No JVD present. Carotid bruit is not present. No thyromegaly present.  Cardiovascular: Normal rate, regular rhythm, normal heart sounds and  intact distal pulses.  Exam reveals no gallop.   Pulmonary/Chest: Effort normal and breath sounds normal. No respiratory distress. She has no wheezes. She exhibits no tenderness.  Abdominal: Soft. Bowel sounds are normal. She exhibits no distension, no abdominal bruit and no mass. There is no tenderness.  Genitourinary: No breast swelling, tenderness, discharge or bleeding.  Genitourinary Comments: Breast exam: No mass, nodules, thickening, tenderness, bulging, retraction, inflamation, nipple discharge or  skin changes noted.  No axillary or clavicular LA.      Musculoskeletal: She exhibits no edema or tenderness.  Lymphadenopathy:    She has no cervical adenopathy.  Neurological: She is alert. She has normal reflexes. No cranial nerve deficit. She exhibits normal muscle tone. Coordination normal.  Skin: Skin is warm and dry. No rash noted. No erythema. No pallor.  Solar lentigines diffusely  Psychiatric: She has a normal mood and affect.          Assessment & Plan:   Problem List Items Addressed This Visit      Cardiovascular and Mediastinum   HTN (hypertension)    bp in fair control at this time  BP Readings from Last 1 Encounters:  05/19/16 124/80   No changes needed Disc lifstyle change with low sodium diet and exercise  Labs rev  Wt loss enc       Relevant Medications   valsartan-hydrochlorothiazide (DIOVAN-HCT) 80-12.5 MG tablet   furosemide (LASIX) 20 MG tablet   atorvastatin (LIPITOR) 10 MG tablet     Endocrine   Diabetes type 2, controlled ([place])    Lab Results  Component Value Date   HGBA1C 6.5 05/14/2016   Currently controlled with diet  Disc imp of low glycemic diet and exercise as tolerated F/u 6 mo  She will schedule her own eye exam       Relevant Medications   valsartan-hydrochlorothiazide (DIOVAN-HCT) 80-12.5 MG tablet   atorvastatin (LIPITOR) 10 MG tablet     Other   Routine general medical examination at a health care facility    Reviewed  health habits including diet and exercise and skin cancer prevention Reviewed appropriate screening tests for age  Also reviewed health mt list, fam hx and immunization status , as well as social and family history   See HPI Reviewed AMW visit  Labs reviewed Declines flu shot/dexa and colon cancer screening  I encourage a mammogram yearly-she pref every 2 years  Declines zoster vaccine  Do get an eye doctor appt. - let us know if you need a referral  If the hearing problem starts to bother you- we can also refer for that  Don't forget to get your flu shot - don't wait too long  Keep up the walking and increase the number of days per week that you walk  Also get rid of the sodas - except for special occasions  Follow up in 6 months with labs prior       Morbid obesity ([place])    Discussed how this problem influences overall health and the risks it imposes  Reviewed plan for weight loss with lower calorie diet (via better food choices and also portion control or program like weight watchers) and exercise building up to or more than 30 minutes 5 days per week including some aerobic activity         HLD (hyperlipidemia)    Controlled with lipitor but HDL is low  Disc imp of exercise / omega 3 to help this  Disc goals for lipids and reasons to control them Rev labs with pt Rev low sat fat diet in detail       Relevant Medications   valsartan-hydrochlorothiazide (DIOVAN-HCT) 80-12.5 MG tablet   furosemide (LASIX) 20 MG tablet   atorvastatin (LIPITOR) 10 MG tablet    Other Visit Diagnoses    Hyperlipidemia    -  Primary   Relevant Medications   valsartan-hydrochlorothiazide (DIOVAN-HCT) 80-12.5 MG tablet   furosemide (  LASIX) 20 MG tablet   atorvastatin (LIPITOR) 10 MG tablet

## 2016-07-08 DIAGNOSIS — L821 Other seborrheic keratosis: Secondary | ICD-10-CM | POA: Diagnosis not present

## 2016-08-06 DIAGNOSIS — H2513 Age-related nuclear cataract, bilateral: Secondary | ICD-10-CM | POA: Diagnosis not present

## 2016-08-26 DIAGNOSIS — H2513 Age-related nuclear cataract, bilateral: Secondary | ICD-10-CM | POA: Diagnosis not present

## 2016-08-28 ENCOUNTER — Telehealth: Payer: Self-pay

## 2016-08-28 DIAGNOSIS — J069 Acute upper respiratory infection, unspecified: Secondary | ICD-10-CM | POA: Diagnosis not present

## 2016-08-28 NOTE — Telephone Encounter (Signed)
Aware, thanks!

## 2016-08-28 NOTE — Telephone Encounter (Signed)
Pt said pt is coughing a lot, chills and concerned has the flu; advised pt she does need to be seen. Pt voiced understanding but does not want to drive to [facility] for [facility]. Pt [facility] [place]. FYI to Dr Glori Bickers.

## 2016-09-01 NOTE — Discharge Instructions (Signed)
Cataract Surgery, Care After °Refer to this sheet in the next few weeks. These instructions provide you with information about caring for yourself after your procedure. Your health care provider may also give you more specific instructions. Your treatment has been planned according to current medical practices, but problems sometimes occur. Call your health care provider if you have any problems or questions after your procedure. °What can I expect after the procedure? °After the procedure, it is common to have: °· Itching. °· Discomfort. °· Fluid discharge. °· Sensitivity to light and to touch. °· Bruising. °Follow these instructions at home: °Eye Care  °· Check your eye every day for signs of infection. Watch for: °¨ Redness, swelling, or pain. °¨ Fluid, blood, or pus. °¨ Warmth. °¨ Bad smell. °Activity  °· Avoid strenuous activities, such as playing contact sports, for as long as told by your health care provider. °· Do not drive or operate heavy machinery until your health care provider approves. °· Do not bend or lift heavy objects . Bending increases pressure in the eye. You can walk, climb stairs, and do light household chores. °· Ask your health care provider when you can return to work. If you work in a dusty environment, you may be advised to wear protective eyewear for a period of time. °General instructions  °· Take or apply over-the-counter and prescription medicines only as told by your health care provider. This includes eye drops. °· Do not touch or rub your eyes. °· If you were given a protective shield, wear it as told by your health care provider. If you were not given a protective shield, wear sunglasses as told by your health care provider to protect your eyes. °· Keep the area around your eye clean and dry. Avoid swimming or allowing water to hit you directly in the face while showering until told by your health care provider. Keep soap and shampoo out of your eyes. °· Do not put a contact lens  into the affected eye or eyes until your health care provider approves. °· Keep all follow-up visits as told by your health care provider. This is important. °Contact a health care provider if: ° °· You have increased bruising around your eye. °· You have pain that is not helped with medicine. °· You have a fever. °· You have redness, swelling, or pain in your eye. °· You have fluid, blood, or pus coming from your incision. °· Your vision gets worse. °Get help right away if: °· You have sudden vision loss. °This information is not intended to replace advice given to you by your health care provider. Make sure you discuss any questions you have with your health care provider. °Document Released: 03/06/2005 Document Revised: 12/26/2015 Document Reviewed: 06/27/2015 °Elsevier Interactive Patient Education © 2017 [employer] ° ° ° ° °General Anesthesia, Adult, Care After °These instructions provide you with information about caring for yourself after your procedure. Your health care provider may also give you more specific instructions. Your treatment has been planned according to current medical practices, but problems sometimes occur. Call your health care provider if you have any problems or questions after your procedure. °What can I expect after the procedure? °After the procedure, it is common to have: °· Vomiting. °· A sore throat. °· Mental slowness. °It is common to feel: °· Nauseous. °· Cold or shivery. °· Sleepy. °· Tired. °· Sore or achy, even in parts of your body where you did not have surgery. °Follow these instructions at   home: °For at least 24 hours after the procedure:  °· Do not: °¨ Participate in activities where you could fall or become injured. °¨ Drive. °¨ Use heavy machinery. °¨ Drink alcohol. °¨ Take sleeping pills or medicines that cause drowsiness. °¨ Make important decisions or sign legal documents. °¨ Take care of children on your own. °· Rest. °Eating and drinking  °· If you vomit, drink  water, juice, or soup when you can drink without vomiting. °· Drink enough fluid to keep your urine clear or pale yellow. °· Make sure you have little or no nausea before eating solid foods. °· Follow the diet recommended by your health care provider. °General instructions  °· Have a responsible adult stay with you until you are awake and alert. °· Return to your normal activities as told by your health care provider. Ask your health care provider what activities are safe for you. °· Take over-the-counter and prescription medicines only as told by your health care provider. °· If you smoke, do not smoke without supervision. °· Keep all follow-up visits as told by your health care provider. This is important. °Contact a health care provider if: °· You continue to have nausea or vomiting at home, and medicines are not helpful. °· You cannot drink fluids or start eating again. °· You cannot urinate after 8-12 hours. °· You develop a skin rash. °· You have fever. °· You have increasing redness at the site of your procedure. °Get help right away if: °· You have difficulty breathing. °· You have chest pain. °· You have unexpected bleeding. °· You feel that you are having a life-threatening or urgent problem. °This information is not intended to replace advice given to you by your health care provider. Make sure you discuss any questions you have with your health care provider. °Document Released: 11/23/2000 Document Revised: 01/20/2016 Document Reviewed: 08/01/2015 °Elsevier Interactive Patient Education © 2017 [employer] Inc. ° °

## 2016-09-02 ENCOUNTER — Encounter: Payer: Self-pay | Admitting: Anesthesiology

## 2016-09-09 ENCOUNTER — Encounter
Admission: RE | Disposition: A | Discharge status: Home / Self Care | Payer: Self-pay | Source: Ambulatory Visit | Attending: Ophthalmology

## 2016-09-09 ENCOUNTER — Ambulatory Visit: Payer: PPO | Admitting: Anesthesiology

## 2016-09-09 ENCOUNTER — Ambulatory Visit
Admission: RE | Admit: 2016-09-09 | Discharge: 2016-09-09 | Disposition: A | Discharge status: Home / Self Care | Payer: PPO | Source: Ambulatory Visit | Attending: Ophthalmology | Admitting: Ophthalmology

## 2016-09-09 DIAGNOSIS — I10 Essential (primary) hypertension: Secondary | ICD-10-CM | POA: Insufficient documentation

## 2016-09-09 DIAGNOSIS — H2511 Age-related nuclear cataract, right eye: Secondary | ICD-10-CM | POA: Diagnosis not present

## 2016-09-09 DIAGNOSIS — H2513 Age-related nuclear cataract, bilateral: Secondary | ICD-10-CM | POA: Diagnosis not present

## 2016-09-09 HISTORY — DX: Cardiac arrhythmia, unspecified: I49.9

## 2016-09-09 HISTORY — PX: CATARACT EXTRACTION W/PHACO: SHX586

## 2016-09-09 SURGERY — PHACOEMULSIFICATION, CATARACT, WITH IOL INSERTION
Anesthesia: Monitor Anesthesia Care | Site: Eye | Laterality: Right | Wound class: Clean

## 2016-09-09 MED ORDER — MOXIFLOXACIN HCL 0.5 % OP SOLN
1.0000 [drp] | OPHTHALMIC | Status: DC | PRN
  Administered 2016-09-09 (×3): 1 [drp] via OPHTHALMIC

## 2016-09-09 MED ORDER — MIDAZOLAM HCL 2 MG/2ML IJ SOLN
INTRAMUSCULAR | Status: DC | PRN
  Administered 2016-09-09: 2 mg via INTRAVENOUS

## 2016-09-09 MED ORDER — LACTATED RINGERS IV SOLN: INTRAVENOUS | Status: DC

## 2016-09-09 MED ORDER — BRIMONIDINE TARTRATE-TIMOLOL 0.2-0.5 % OP SOLN
OPHTHALMIC | Status: DC | PRN
  Administered 2016-09-09: 1 [drp] via OPHTHALMIC

## 2016-09-09 MED ORDER — LIDOCAINE HCL (PF) 4 % IJ SOLN
INTRAOCULAR | Status: DC | PRN
  Administered 2016-09-09: .5 mL via OPHTHALMIC

## 2016-09-09 MED ORDER — EPINEPHRINE PF 1 MG/ML IJ SOLN
INTRAMUSCULAR | Status: DC | PRN
  Administered 2016-09-09: 50 mL via OPHTHALMIC

## 2016-09-09 MED ORDER — ARMC OPHTHALMIC DILATING DROPS
1.0000 "application " | OPHTHALMIC | Status: DC | PRN
  Administered 2016-09-09 (×3): 1 via OPHTHALMIC

## 2016-09-09 MED ORDER — MOXIFLOXACIN HCL 0.5 % OP SOLN
OPHTHALMIC | Status: DC | PRN
  Administered 2016-09-09: .1 mL via OPHTHALMIC

## 2016-09-09 MED ORDER — NA HYALUR & NA CHOND-NA HYALUR 0.4-0.35 ML IO KIT
PACK | INTRAOCULAR | Status: DC | PRN
  Administered 2016-09-09: 1 mL via INTRAOCULAR

## 2016-09-09 MED ORDER — FENTANYL CITRATE (PF) 100 MCG/2ML IJ SOLN
INTRAMUSCULAR | Status: DC | PRN
  Administered 2016-09-09: 100 ug via INTRAVENOUS

## 2016-09-09 SURGICAL SUPPLY — 27 items
CANNULA ANT/CHMB 27G (MISCELLANEOUS) ×1 IMPLANT
CANNULA ANT/CHMB 27GA (MISCELLANEOUS) ×2 IMPLANT
CARTRIDGE ABBOTT (MISCELLANEOUS) IMPLANT
GLOVE SURG LX 7.5 STRW (GLOVE) ×2
GLOVE SURG LX STRL 7.5 STRW (GLOVE) ×1 IMPLANT
GLOVE SURG TRIUMPH 8.0 PF LTX (GLOVE) ×2 IMPLANT
GOWN STRL REUS W/ TWL LRG LVL3 (GOWN DISPOSABLE) ×2 IMPLANT
GOWN STRL REUS W/TWL LRG LVL3 (GOWN DISPOSABLE) ×4
LENS IOL TECNIS ITEC 23.5 (Intraocular Lens) ×1 IMPLANT
MARKER SKIN DUAL TIP RULER LAB (MISCELLANEOUS) ×2 IMPLANT
NDL FILTER BLUNT 18X1 1/2 (NEEDLE) ×1 IMPLANT
NDL RETROBULBAR .5 NSTRL (NEEDLE) IMPLANT
NEEDLE FILTER BLUNT 18X 1/2SAF (NEEDLE) ×1
NEEDLE FILTER BLUNT 18X1 1/2 (NEEDLE) ×1 IMPLANT
PACK CATARACT BRASINGTON (MISCELLANEOUS) ×2 IMPLANT
PACK EYE AFTER SURG (MISCELLANEOUS) ×2 IMPLANT
PACK OPTHALMIC (MISCELLANEOUS) ×2 IMPLANT
RING MALYGIN 7.0 (MISCELLANEOUS) IMPLANT
SUT ETHILON 10-0 CS-B-6CS-B-6 (SUTURE)
SUT VICRYL  9 0 (SUTURE)
SUT VICRYL 9 0 (SUTURE) IMPLANT
SUTURE EHLN 10-0 CS-B-6CS-B-6 (SUTURE) IMPLANT
SYR 3ML LL SCALE MARK (SYRINGE) ×2 IMPLANT
SYR 5ML LL (SYRINGE) ×2 IMPLANT
SYR TB 1ML LUER SLIP (SYRINGE) ×2 IMPLANT
WATER STERILE IRR 250ML POUR (IV SOLUTION) ×2 IMPLANT
WIPE NON LINTING 3.25X3.25 (MISCELLANEOUS) ×2 IMPLANT

## 2016-09-09 NOTE — Op Note (Signed)
LOCATION:  [facility]   PREOPERATIVE DIAGNOSIS:    Nuclear sclerotic cataract right eye. H25.11   POSTOPERATIVE DIAGNOSIS:  Nuclear sclerotic cataract right eye.     PROCEDURE:  Phacoemusification with posterior chamber intraocular lens placement of the right eye   LENS:   Implant Name Type Inv. Item Serial No. Manufacturer Lot No. LRB No. Used  LENS IOL DIOP 23.5 - [id number] Intraocular Lens LENS IOL DIOP 23.5 [id number]   Right 1        ULTRASOUND TIME: 21 % of 1 minutes, 2 seconds.  CDE 13.2   SURGEON:  Wyonia Hough, MD   ANESTHESIA:  Topical with tetracaine drops and 2% Xylocaine jelly, augmented with 1% preservative-free intracameral lidocaine.    COMPLICATIONS:  None.   DESCRIPTION OF PROCEDURE:  The patient was identified in the holding room and transported to the operating room and placed in the supine position under the operating microscope.  The right eye was identified as the operative eye and it was prepped and draped in the usual sterile ophthalmic fashion.   A 1 millimeter clear-corneal paracentesis was made at the 12:00 position.  0.5 ml of preservative-free 1% lidocaine was injected into the anterior chamber. The anterior chamber was filled with Viscoat viscoelastic.  A 2.4 millimeter keratome was used to make a near-clear corneal incision at the 9:00 position.  A curvilinear capsulorrhexis was made with a cystotome and capsulorrhexis forceps.  Balanced salt solution was used to hydrodissect and hydrodelineate the nucleus.   Phacoemulsification was then used in stop and chop fashion to remove the lens nucleus and epinucleus.  The remaining cortex was then removed using the irrigation and aspiration handpiece. Provisc was then placed into the capsular bag to distend it for lens placement.  A lens was then injected into the capsular bag.  The remaining viscoelastic was aspirated.   Wounds were hydrated with balanced salt solution.  The anterior  chamber was inflated to a physiologic pressure with balanced salt solution.  No wound leaks were noted. Vigamox 0.2 ml of a 1mg  per ml solution was injected into the anterior chamber for a dose of 0.2 mg of intracameral antibiotic at the completion of the case.   Timolol and Brimonidine drops were applied to the eye.  The patient was taken to the recovery room in stable condition without complications of anesthesia or surgery.   Hannah Duran 09/09/2016, 9:08 AM

## 2016-09-09 NOTE — Anesthesia Procedure Notes (Signed)
Procedure Name: MAC Date/Time: 09/09/2016 8:50 AM Performed by: Janna Arch Pre-anesthesia Checklist: Patient identified, Emergency Drugs available, Suction available and Patient being monitored Patient Re-evaluated:Patient Re-evaluated prior to inductionOxygen Delivery Method: Nasal cannula

## 2016-09-09 NOTE — Anesthesia Preprocedure Evaluation (Addendum)
Anesthesia Evaluation  Patient identified by MRN, date of birth, ID band Patient awake    Airway Mallampati: II  TM Distance: >3 FB Neck ROM: Full    Dental   Pulmonary    Pulmonary exam normal        Cardiovascular hypertension, Normal cardiovascular exam     Neuro/Psych    GI/Hepatic   Endo/Other    Renal/GU      Musculoskeletal   Abdominal   Peds  Hematology   Anesthesia Other Findings   Reproductive/Obstetrics                            Anesthesia Physical Anesthesia Plan  ASA: II  Anesthesia Plan: MAC   Post-op Pain Management:    Induction: Intravenous  Airway Management Planned:   Additional Equipment:   Intra-op Plan:   Post-operative Plan:   Informed Consent: I have reviewed the patients History and Physical, chart, labs and discussed the procedure including the risks, benefits and alternatives for the proposed anesthesia with the patient or authorized representative who has indicated his/her understanding and acceptance.     Plan Discussed with: CRNA  Anesthesia Plan Comments:         Anesthesia Quick Evaluation

## 2016-09-09 NOTE — Anesthesia Postprocedure Evaluation (Signed)
Anesthesia Post Note  Patient: Hannah Duran  Procedure(s) Performed: Procedure(s) (LRB): CATARACT EXTRACTION PHACO AND INTRAOCULAR LENS PLACEMENT (IOC) (Right)  Patient location during evaluation: PACU Anesthesia Type: MAC Level of consciousness: awake and alert Pain management: pain level controlled Vital Signs Assessment: post-procedure vital signs reviewed and stable Respiratory status: spontaneous breathing, nonlabored ventilation, respiratory function stable and patient connected to nasal cannula oxygen Cardiovascular status: stable and blood pressure returned to baseline Anesthetic complications: no    Marshell Levan

## 2016-09-09 NOTE — Transfer of Care (Signed)
Immediate Anesthesia Transfer of Care Note  Patient: Hannah Duran  Procedure(s) Performed: Procedure(s) with comments: CATARACT EXTRACTION PHACO AND INTRAOCULAR LENS PLACEMENT (IOC) (Right) - right  Patient Location: PACU  Anesthesia Type: MAC  Level of Consciousness: awake, alert  and patient cooperative  Airway and Oxygen Therapy: Patient Spontanous Breathing and Patient connected to supplemental oxygen  Post-op Assessment: Post-op Vital signs reviewed, Patient's Cardiovascular Status Stable, Respiratory Function Stable, Patent Airway and No signs of Nausea or vomiting  Post-op Vital Signs: Reviewed and stable  Complications: No apparent anesthesia complications

## 2016-09-09 NOTE — H&P (Signed)
The History and Physical notes are on paper, have been signed, and are to be scanned. The patient remains stable and unchanged from the H&P.   Previous H&P reviewed, patient examined, and there are no changes.  Hannah Duran 09/09/2016 8:14 AM

## 2016-09-10 ENCOUNTER — Encounter: Payer: Self-pay | Admitting: Ophthalmology

## 2016-09-15 ENCOUNTER — Encounter: Payer: Self-pay | Admitting: Ophthalmology

## 2016-12-01 DIAGNOSIS — H2512 Age-related nuclear cataract, left eye: Secondary | ICD-10-CM | POA: Diagnosis not present

## 2016-12-09 ENCOUNTER — Encounter: Payer: Self-pay | Admitting: *Deleted

## 2016-12-11 NOTE — Discharge Instructions (Signed)
Cataract Surgery, Care After °Refer to this sheet in the next few weeks. These instructions provide you with information about caring for yourself after your procedure. Your health care provider may also give you more specific instructions. Your treatment has been planned according to current medical practices, but problems sometimes occur. Call your health care provider if you have any problems or questions after your procedure. °What can I expect after the procedure? °After the procedure, it is common to have: °· Itching. °· Discomfort. °· Fluid discharge. °· Sensitivity to light and to touch. °· Bruising. °Follow these instructions at home: °Eye Care  °· Check your eye every day for signs of infection. Watch for: °¨ Redness, swelling, or pain. °¨ Fluid, blood, or pus. °¨ Warmth. °¨ Bad smell. °Activity  °· Avoid strenuous activities, such as playing contact sports, for as long as told by your health care provider. °· Do not drive or operate heavy machinery until your health care provider approves. °· Do not bend or lift heavy objects . Bending increases pressure in the eye. You can walk, climb stairs, and do light household chores. °· Ask your health care provider when you can return to work. If you work in a dusty environment, you may be advised to wear protective eyewear for a period of time. °General instructions  °· Take or apply over-the-counter and prescription medicines only as told by your health care provider. This includes eye drops. °· Do not touch or rub your eyes. °· If you were given a protective shield, wear it as told by your health care provider. If you were not given a protective shield, wear sunglasses as told by your health care provider to protect your eyes. °· Keep the area around your eye clean and dry. Avoid swimming or allowing water to hit you directly in the face while showering until told by your health care provider. Keep soap and shampoo out of your eyes. °· Do not put a contact lens  into the affected eye or eyes until your health care provider approves. °· Keep all follow-up visits as told by your health care provider. This is important. °Contact a health care provider if: ° °· You have increased bruising around your eye. °· You have pain that is not helped with medicine. °· You have a fever. °· You have redness, swelling, or pain in your eye. °· You have fluid, blood, or pus coming from your incision. °· Your vision gets worse. °Get help right away if: °· You have sudden vision loss. °This information is not intended to replace advice given to you by your health care provider. Make sure you discuss any questions you have with your health care provider. °Document Released: 03/06/2005 Document Revised: 12/26/2015 Document Reviewed: 06/27/2015 °Elsevier Interactive Patient Education © 2017 [employer] ° ° ° ° °General Anesthesia, Adult, Care After °These instructions provide you with information about caring for yourself after your procedure. Your health care provider may also give you more specific instructions. Your treatment has been planned according to current medical practices, but problems sometimes occur. Call your health care provider if you have any problems or questions after your procedure. °What can I expect after the procedure? °After the procedure, it is common to have: °· Vomiting. °· A sore throat. °· Mental slowness. °It is common to feel: °· Nauseous. °· Cold or shivery. °· Sleepy. °· Tired. °· Sore or achy, even in parts of your body where you did not have surgery. °Follow these instructions at   home: °For at least 24 hours after the procedure:  °· Do not: °¨ Participate in activities where you could fall or become injured. °¨ Drive. °¨ Use heavy machinery. °¨ Drink alcohol. °¨ Take sleeping pills or medicines that cause drowsiness. °¨ Make important decisions or sign legal documents. °¨ Take care of children on your own. °· Rest. °Eating and drinking  °· If you vomit, drink  water, juice, or soup when you can drink without vomiting. °· Drink enough fluid to keep your urine clear or pale yellow. °· Make sure you have little or no nausea before eating solid foods. °· Follow the diet recommended by your health care provider. °General instructions  °· Have a responsible adult stay with you until you are awake and alert. °· Return to your normal activities as told by your health care provider. Ask your health care provider what activities are safe for you. °· Take over-the-counter and prescription medicines only as told by your health care provider. °· If you smoke, do not smoke without supervision. °· Keep all follow-up visits as told by your health care provider. This is important. °Contact a health care provider if: °· You continue to have nausea or vomiting at home, and medicines are not helpful. °· You cannot drink fluids or start eating again. °· You cannot urinate after 8-12 hours. °· You develop a skin rash. °· You have fever. °· You have increasing redness at the site of your procedure. °Get help right away if: °· You have difficulty breathing. °· You have chest pain. °· You have unexpected bleeding. °· You feel that you are having a life-threatening or urgent problem. °This information is not intended to replace advice given to you by your health care provider. Make sure you discuss any questions you have with your health care provider. °Document Released: 11/23/2000 Document Revised: 01/20/2016 Document Reviewed: 08/01/2015 °Elsevier Interactive Patient Education © 2017 [employer] Inc. ° °

## 2016-12-16 ENCOUNTER — Encounter
Admission: RE | Disposition: A | Discharge status: Home / Self Care | Payer: Self-pay | Source: Ambulatory Visit | Attending: Ophthalmology

## 2016-12-16 ENCOUNTER — Ambulatory Visit: Payer: PPO | Admitting: Anesthesiology

## 2016-12-16 ENCOUNTER — Ambulatory Visit
Admission: RE | Admit: 2016-12-16 | Discharge: 2016-12-16 | Disposition: A | Discharge status: Home / Self Care | Payer: PPO | Source: Ambulatory Visit | Attending: Ophthalmology | Admitting: Ophthalmology

## 2016-12-16 DIAGNOSIS — H2512 Age-related nuclear cataract, left eye: Secondary | ICD-10-CM | POA: Insufficient documentation

## 2016-12-16 DIAGNOSIS — I10 Essential (primary) hypertension: Secondary | ICD-10-CM | POA: Insufficient documentation

## 2016-12-16 DIAGNOSIS — E119 Type 2 diabetes mellitus without complications: Secondary | ICD-10-CM | POA: Insufficient documentation

## 2016-12-16 DIAGNOSIS — H2511 Age-related nuclear cataract, right eye: Secondary | ICD-10-CM | POA: Diagnosis not present

## 2016-12-16 HISTORY — PX: CATARACT EXTRACTION W/PHACO: SHX586

## 2016-12-16 SURGERY — PHACOEMULSIFICATION, CATARACT, WITH IOL INSERTION
Anesthesia: Monitor Anesthesia Care | Site: Eye | Laterality: Left | Wound class: Clean

## 2016-12-16 MED ORDER — LIDOCAINE HCL (PF) 2 % IJ SOLN
INTRAMUSCULAR | Status: DC | PRN
  Administered 2016-12-16: 2 mL

## 2016-12-16 MED ORDER — ARMC OPHTHALMIC DILATING DROPS
1.0000 "application " | OPHTHALMIC | Status: DC | PRN
  Administered 2016-12-16 (×3): 1 via OPHTHALMIC

## 2016-12-16 MED ORDER — FENTANYL CITRATE (PF) 100 MCG/2ML IJ SOLN
INTRAMUSCULAR | Status: DC | PRN
  Administered 2016-12-16: 100 ug via INTRAVENOUS

## 2016-12-16 MED ORDER — MIDAZOLAM HCL 2 MG/2ML IJ SOLN
INTRAMUSCULAR | Status: DC | PRN
  Administered 2016-12-16: 2 mg via INTRAVENOUS

## 2016-12-16 MED ORDER — NA HYALUR & NA CHOND-NA HYALUR 0.4-0.35 ML IO KIT
PACK | INTRAOCULAR | Status: DC | PRN
  Administered 2016-12-16: 1 mL via INTRAOCULAR

## 2016-12-16 MED ORDER — MOXIFLOXACIN HCL 0.5 % OP SOLN
1.0000 [drp] | OPHTHALMIC | Status: DC | PRN
  Administered 2016-12-16 (×3): 1 [drp] via OPHTHALMIC

## 2016-12-16 MED ORDER — LACTATED RINGERS IV SOLN: INTRAVENOUS | Status: DC

## 2016-12-16 MED ORDER — EPINEPHRINE PF 1 MG/ML IJ SOLN
INTRAMUSCULAR | Status: DC | PRN
  Administered 2016-12-16: 59 mL via OPHTHALMIC

## 2016-12-16 MED ORDER — BRIMONIDINE TARTRATE-TIMOLOL 0.2-0.5 % OP SOLN
OPHTHALMIC | Status: DC | PRN
  Administered 2016-12-16: 1 [drp] via OPHTHALMIC

## 2016-12-16 MED ORDER — MOXIFLOXACIN HCL 0.5 % OP SOLN
OPHTHALMIC | Status: DC | PRN
  Administered 2016-12-16: 0.2 mL via OPHTHALMIC

## 2016-12-16 SURGICAL SUPPLY — 28 items
CANNULA ANT/CHMB 27G (MISCELLANEOUS) ×1 IMPLANT
CANNULA ANT/CHMB 27GA (MISCELLANEOUS) ×3 IMPLANT
CARTRIDGE ABBOTT (MISCELLANEOUS) IMPLANT
GLOVE SURG LX 7.5 STRW (GLOVE) ×2
GLOVE SURG LX STRL 7.5 STRW (GLOVE) ×1 IMPLANT
GLOVE SURG TRIUMPH 8.0 PF LTX (GLOVE) ×3 IMPLANT
GOWN STRL REUS W/ TWL LRG LVL3 (GOWN DISPOSABLE) ×2 IMPLANT
GOWN STRL REUS W/TWL LRG LVL3 (GOWN DISPOSABLE) ×6
KIT SLEEVE INFUSION .9 MICRO (MISCELLANEOUS) ×2 IMPLANT
LENS IOL TECNIS ITEC 23.5 (Intraocular Lens) ×2 IMPLANT
MARKER SKIN DUAL TIP RULER LAB (MISCELLANEOUS) ×3 IMPLANT
NDL FILTER BLUNT 18X1 1/2 (NEEDLE) ×1 IMPLANT
NDL RETROBULBAR .5 NSTRL (NEEDLE) IMPLANT
NEEDLE FILTER BLUNT 18X 1/2SAF (NEEDLE) ×2
NEEDLE FILTER BLUNT 18X1 1/2 (NEEDLE) ×1 IMPLANT
PACK CATARACT BRASINGTON (MISCELLANEOUS) ×3 IMPLANT
PACK EYE AFTER SURG (MISCELLANEOUS) ×3 IMPLANT
PACK OPTHALMIC (MISCELLANEOUS) ×3 IMPLANT
RING MALYGIN 7.0 (MISCELLANEOUS) IMPLANT
SUT ETHILON 10-0 CS-B-6CS-B-6 (SUTURE)
SUT VICRYL  9 0 (SUTURE)
SUT VICRYL 9 0 (SUTURE) IMPLANT
SUTURE EHLN 10-0 CS-B-6CS-B-6 (SUTURE) IMPLANT
SYR 3ML LL SCALE MARK (SYRINGE) ×3 IMPLANT
SYR 5ML LL (SYRINGE) ×3 IMPLANT
SYR TB 1ML LUER SLIP (SYRINGE) ×3 IMPLANT
WATER STERILE IRR 250ML POUR (IV SOLUTION) ×3 IMPLANT
WIPE NON LINTING 3.25X3.25 (MISCELLANEOUS) ×3 IMPLANT

## 2016-12-16 NOTE — Anesthesia Postprocedure Evaluation (Signed)
Anesthesia Post Note  Patient: Hannah Duran  Procedure(s) Performed: Procedure(s) (LRB): CATARACT EXTRACTION PHACO AND INTRAOCULAR LENS PLACEMENT (IOC)  Left (Left)  Patient location during evaluation: PACU Anesthesia Type: MAC Level of consciousness: awake and alert Pain management: pain level controlled Vital Signs Assessment: post-procedure vital signs reviewed and stable Respiratory status: spontaneous breathing, nonlabored ventilation, respiratory function stable and patient connected to nasal cannula oxygen Cardiovascular status: stable and blood pressure returned to baseline Anesthetic complications: no    Trecia Rogers

## 2016-12-16 NOTE — Anesthesia Preprocedure Evaluation (Signed)
Anesthesia Evaluation  Patient identified by MRN, date of birth, ID band Patient awake    Reviewed: Allergy & Precautions, H&P , NPO status , Patient's Chart, lab work & pertinent test results, reviewed documented beta blocker date and time   Airway Mallampati: II  TM Distance: >3 FB Neck ROM: full    Dental no notable dental hx.    Pulmonary neg pulmonary ROS,    Pulmonary exam normal breath sounds clear to auscultation       Cardiovascular Exercise Tolerance: Good hypertension, Normal cardiovascular exam Rhythm:regular Rate:Normal     Neuro/Psych negative neurological ROS  negative psych ROS   GI/Hepatic negative GI ROS, Neg liver ROS,   Endo/Other  diabetes, Type 2  Renal/GU negative Renal ROS  negative genitourinary   Musculoskeletal   Abdominal   Peds  Hematology negative hematology ROS (+)   Anesthesia Other Findings   Reproductive/Obstetrics negative OB ROS                             Anesthesia Physical Anesthesia Plan  ASA: II  Anesthesia Plan: MAC   Post-op Pain Management:    Induction:   Airway Management Planned:   Additional Equipment:   Intra-op Plan:   Post-operative Plan:   Informed Consent: I have reviewed the patients History and Physical, chart, labs and discussed the procedure including the risks, benefits and alternatives for the proposed anesthesia with the patient or authorized representative who has indicated his/her understanding and acceptance.   Dental Advisory Given  Plan Discussed with: CRNA and Anesthesiologist  Anesthesia Plan Comments:         Anesthesia Quick Evaluation

## 2016-12-16 NOTE — Anesthesia Procedure Notes (Signed)
Procedure Name: MAC Performed by: Asher Babilonia Pre-anesthesia Checklist: Patient identified, Emergency Drugs available, Suction available, Timeout performed and Patient being monitored Patient Re-evaluated:Patient Re-evaluated prior to inductionOxygen Delivery Method: Nasal cannula Placement Confirmation: positive ETCO2     

## 2016-12-16 NOTE — Op Note (Signed)
OPERATIVE NOTE  Desa Rech [id number] 12/16/2016   PREOPERATIVE DIAGNOSIS:  Nuclear sclerotic cataract left eye. H25.12   POSTOPERATIVE DIAGNOSIS:    Nuclear sclerotic cataract left eye.     PROCEDURE:  Phacoemusification with posterior chamber intraocular lens placement of the left eye   LENS:   Implant Name Type Inv. Item Serial No. Manufacturer Lot No. LRB No. Used  LENS IOL DIOP 23.5 - [id number] Intraocular Lens LENS IOL DIOP 23.5 [id number]   Left 1        ULTRASOUND TIME: 18  % of 1 minutes 6 seconds, CDE 11.7  SURGEON:  Wyonia Hough, MD   ANESTHESIA:  Topical with tetracaine drops and 2% Xylocaine jelly, augmented with 1% preservative-free intracameral lidocaine.    COMPLICATIONS:  None.   DESCRIPTION OF PROCEDURE:  The patient was identified in the holding room and transported to the operating room and placed in the supine position under the operating microscope.  The left eye was identified as the operative eye and it was prepped and draped in the usual sterile ophthalmic fashion.   A 1 millimeter clear-corneal paracentesis was made at the 1:30 position.  0.5 ml of preservative-free 1% lidocaine was injected into the anterior chamber.  The anterior chamber was filled with Viscoat viscoelastic.  A 2.4 millimeter keratome was used to make a near-clear corneal incision at the 10:30 position.  .  A curvilinear capsulorrhexis was made with a cystotome and capsulorrhexis forceps.  Balanced salt solution was used to hydrodissect and hydrodelineate the nucleus.   Phacoemulsification was then used in stop and chop fashion to remove the lens nucleus and epinucleus.  The remaining cortex was then removed using the irrigation and aspiration handpiece. Provisc was then placed into the capsular bag to distend it for lens placement.  A lens was then injected into the capsular bag.  The remaining viscoelastic was aspirated.   Wounds were hydrated with balanced salt  solution.  The anterior chamber was inflated to a physiologic pressure with balanced salt solution.  No wound leaks were noted. Vigamox 0.2 ml of a 1mg  per ml solution was injected into the anterior chamber for a dose of 0.2 mg of intracameral antibiotic at the completion of the case.   Timolol and Brimonidine drops were applied to the eye.  The patient was taken to the recovery room in stable condition without complications of anesthesia or surgery.  Dan Dissinger 12/16/2016, 10:04 AM

## 2016-12-16 NOTE — Transfer of Care (Signed)
Immediate Anesthesia Transfer of Care Note  Patient: Hannah Duran  Procedure(s) Performed: Procedure(s): CATARACT EXTRACTION PHACO AND INTRAOCULAR LENS PLACEMENT (IOC)  Left (Left)  Patient Location: PACU  Anesthesia Type: MAC  Level of Consciousness: awake, alert  and patient cooperative  Airway and Oxygen Therapy: Patient Spontanous Breathing and Patient connected to supplemental oxygen  Post-op Assessment: Post-op Vital signs reviewed, Patient's Cardiovascular Status Stable, Respiratory Function Stable, Patent Airway and No signs of Nausea or vomiting  Post-op Vital Signs: Reviewed and stable  Complications: No apparent anesthesia complications

## 2016-12-16 NOTE — H&P (Signed)
The History and Physical notes are on paper, have been signed, and are to be scanned. The patient remains stable and unchanged from the H&P.   Previous H&P reviewed, patient examined, and there are no changes.  Hannah Duran 12/16/2016 9:16 AM

## 2016-12-17 ENCOUNTER — Encounter: Payer: Self-pay | Admitting: Ophthalmology

## 2016-12-22 ENCOUNTER — Encounter: Payer: Self-pay | Admitting: Ophthalmology

## 2017-03-31 ENCOUNTER — Telehealth: Payer: Self-pay | Admitting: *Deleted

## 2017-03-31 MED ORDER — LOSARTAN POTASSIUM-HCTZ 50-12.5 MG PO TABS: 1.0000 | ORAL_TABLET | Freq: Every day | ORAL | 3 refills | Status: DC

## 2017-03-31 NOTE — Telephone Encounter (Signed)
I sent losartan as a substitute If any problems please alert me

## 2017-03-31 NOTE — Telephone Encounter (Signed)
Received a fax from [facility] saying that pt's diovan is on recall and request that you send in an alt med for pt

## 2017-04-01 NOTE — Telephone Encounter (Signed)
Pt notified new Rx sent to pharmacy and to stop the diovan and start the hyzaar and to alert [place] of any problems

## 2017-05-06 ENCOUNTER — Other Ambulatory Visit: Payer: Self-pay | Admitting: *Deleted

## 2017-05-06 NOTE — Telephone Encounter (Signed)
Pt hasn't been seen in a year and no future appts., please advise  

## 2017-05-06 NOTE — Telephone Encounter (Signed)
Please schedule a f/u and refill until then 

## 2017-05-07 MED ORDER — ATORVASTATIN CALCIUM 10 MG PO TABS: 10.0000 mg | ORAL_TABLET | Freq: Every day | ORAL | 0 refills | Status: DC

## 2017-05-07 NOTE — Telephone Encounter (Signed)
appt scheduled with Hannah Duran so med refilled

## 2017-05-07 NOTE — Telephone Encounter (Signed)
Left voicemail requesting pt to call the office back 

## 2017-05-20 ENCOUNTER — Telehealth: Payer: Self-pay | Admitting: Family Medicine

## 2017-05-20 DIAGNOSIS — I10 Essential (primary) hypertension: Secondary | ICD-10-CM

## 2017-05-20 DIAGNOSIS — E119 Type 2 diabetes mellitus without complications: Secondary | ICD-10-CM

## 2017-05-20 DIAGNOSIS — E7800 Pure hypercholesterolemia, unspecified: Secondary | ICD-10-CM

## 2017-05-20 NOTE — Telephone Encounter (Signed)
-----   Message from Eustace Pen, LPN sent at 1/95/9747  4:58 PM EDT ----- Regarding: Labs 05/21/17 Please place lab orders.  [employer]

## 2017-05-21 ENCOUNTER — Encounter (INDEPENDENT_AMBULATORY_CARE_PROVIDER_SITE_OTHER): Payer: Self-pay

## 2017-05-21 ENCOUNTER — Ambulatory Visit (INDEPENDENT_AMBULATORY_CARE_PROVIDER_SITE_OTHER): Payer: PPO

## 2017-05-21 VITALS — BP 108/64 | HR 65 | Temp 98.3°F | Ht 64.75 in | Wt 248.0 lb

## 2017-05-21 DIAGNOSIS — I10 Essential (primary) hypertension: Secondary | ICD-10-CM | POA: Diagnosis not present

## 2017-05-21 DIAGNOSIS — E7800 Pure hypercholesterolemia, unspecified: Secondary | ICD-10-CM

## 2017-05-21 DIAGNOSIS — E119 Type 2 diabetes mellitus without complications: Secondary | ICD-10-CM

## 2017-05-21 DIAGNOSIS — Z0000 Encounter for general adult medical examination without abnormal findings: Secondary | ICD-10-CM

## 2017-05-21 LAB — COMPREHENSIVE METABOLIC PANEL
ALBUMIN: 3.9 g/dL (ref 3.5–5.2)
ALK PHOS: 90 U/L (ref 39–117)
ALT: 14 U/L (ref 0–35)
AST: 18 U/L (ref 0–37)
BUN: 13 mg/dL (ref 6–23)
CHLORIDE: 102 meq/L (ref 96–112)
CO2: 31 mEq/L (ref 19–32)
Calcium: 9.3 mg/dL (ref 8.4–10.5)
Creatinine, Ser: 0.72 mg/dL (ref 0.40–1.20)
GFR: 82.61 mL/min (ref >60.00)
Glucose, Bld: 112 mg/dL — ABNORMAL HIGH (ref 70–99)
POTASSIUM: 3.6 meq/L (ref 3.5–5.1)
Sodium: 141 mEq/L (ref 135–145)
TOTAL PROTEIN: 6.5 g/dL (ref 6.0–8.3)
Total Bilirubin: 0.7 mg/dL (ref 0.2–1.2)

## 2017-05-21 LAB — LIPID PANEL
CHOLESTEROL: 163 mg/dL (ref 0–200)
HDL: 32.1 mg/dL — AB (ref >39.00)
LDL Cholesterol: 102 mg/dL — ABNORMAL HIGH (ref 0–99)
NonHDL: 131.39
TRIGLYCERIDES: 145 mg/dL (ref 0.0–149.0)
Total CHOL/HDL Ratio: 5
VLDL: 29 mg/dL (ref 0.0–40.0)

## 2017-05-21 LAB — CBC WITH DIFFERENTIAL/PLATELET
BASOS ABS: 0.1 10*3/uL (ref 0.0–0.1)
Basophils Relative: 1.1 % (ref 0.0–3.0)
EOS ABS: 0.3 10*3/uL (ref 0.0–0.7)
Eosinophils Relative: 4.3 % (ref 0.0–5.0)
HCT: 37.7 % (ref 36.0–46.0)
HEMOGLOBIN: 12.2 g/dL (ref 12.0–15.0)
LYMPHS PCT: 18.2 % (ref 12.0–46.0)
Lymphs Abs: 1.5 10*3/uL (ref 0.7–4.0)
MCHC: 32.4 g/dL (ref 30.0–36.0)
MCV: 87.4 fl (ref 78.0–100.0)
MONO ABS: 0.8 10*3/uL (ref 0.1–1.0)
Monocytes Relative: 10.1 % (ref 3.0–12.0)
NEUTROS ABS: 5.4 10*3/uL (ref 1.4–7.7)
Neutrophils Relative %: 66.3 % (ref 43.0–77.0)
Platelets: 311 10*3/uL (ref 150.0–400.0)
RBC: 4.31 Mil/uL (ref 3.87–5.11)
RDW: 14.8 % (ref 11.5–15.5)
WBC: 8.1 10*3/uL (ref 4.0–10.5)

## 2017-05-21 LAB — TSH: TSH: 1.97 u[IU]/mL (ref 0.35–4.50)

## 2017-05-21 LAB — HEMOGLOBIN A1C: HEMOGLOBIN A1C: 7 % — AB (ref 4.6–6.5)

## 2017-05-21 NOTE — Progress Notes (Signed)
PCP notes:   Health maintenance:  Mammogram - addressed; pt plans to schedule appt Flu vaccine - addressed; pt desires to have vaccine at future date Foot exam - PCP please address at next appt A1C - completed  Abnormal screenings:   Mini-Cog score: 19/20 Hearing - failed  Hearing Screening   125Hz  250Hz  500Hz  1000Hz  2000Hz  3000Hz  4000Hz  6000Hz  8000Hz   Right ear:   40 0 0  0    Left ear:   0 0 0  0     Patient concerns:   None  Nurse concerns:  None  Next PCP appt:   05/28/17 @ 1430  I reviewed health advisor's note, was available for consultation, and agree with documentation and plan. Loura Pardon MD

## 2017-05-21 NOTE — Progress Notes (Signed)
Pre visit review using our clinic review tool, if applicable. No additional management support is needed unless otherwise documented below in the visit note. 

## 2017-05-21 NOTE — Progress Notes (Signed)
Subjective:   Hannah Duran is a 83 y.o. female who presents for Medicare Annual (Subsequent) preventive examination.  Review of Systems:  N/A Cardiac Risk Factors include: advanced age (>93men, >59 women);diabetes mellitus;dyslipidemia;hypertension     Objective:     Vitals: BP 108/64 (BP Location: Right Arm, Patient Position: Sitting, Cuff Size: Large)   Pulse 65   Temp 98.3 F (36.8 C) (Oral)   Ht 5' 4.75" (1.645 m) Comment: no shoes  Wt 248 lb (112.5 kg)   LMP 08/31/1980   SpO2 98%   BMI 41.59 kg/m   Body mass index is 41.59 kg/m.   Tobacco History  Smoking Status  . Never Smoker  Smokeless Tobacco  . Never Used     Counseling given: No   Past Medical History:  Diagnosis Date  . Congenital foot deformity   . Dermatophytosis   . HLD (hyperlipidemia)   . HTN (hypertension)   . Hyperglycemia   . Irregular heart rhythm    pt unsure of what type; followed by PCP only  . Ischemic colitis ([place]) 7/13   on colonoscopy  . Obesity   . Rotator cuff syndrome    Past Surgical History:  Procedure Laterality Date  . CATARACT EXTRACTION W/PHACO Right 09/09/2016   Procedure: CATARACT EXTRACTION PHACO AND INTRAOCULAR LENS PLACEMENT (IOC);  Surgeon: Leandrew Koyanagi, MD;  Location: [facility];  Service: Ophthalmology;  Laterality: Right;  right  . CATARACT EXTRACTION W/PHACO Left 12/16/2016   Procedure: CATARACT EXTRACTION PHACO AND INTRAOCULAR LENS PLACEMENT ([place])  Left;  Surgeon: Leandrew Koyanagi, MD;  Location: [facility];  Service: Ophthalmology;  Laterality: Left;  . HUMERUS FRACTURE SURGERY  10/1997  . TOTAL ABDOMINAL HYSTERECTOMY  1980's   due to menometrorrhagia   Family History  Problem Relation Age of Onset  . Stroke Mother   . Parkinsonism Mother   . Dementia Mother   . Hypertension Mother   . Cancer Father        bladder   History  Sexual Activity  . Sexual activity: No    Outpatient Encounter Prescriptions as of  05/21/2017  Medication Sig  . atorvastatin (LIPITOR) 10 MG tablet Take 1 tablet (10 mg total) by mouth daily.  . furosemide (LASIX) 20 MG tablet Take 1 tablet (20 mg total) by mouth daily.  Marland Kitchen losartan-hydrochlorothiazide (HYZAAR) 50-12.5 MG tablet Take 1 tablet by mouth daily.   No facility-administered encounter medications on file as of 05/21/2017.     Activities of Daily Living In your present state of health, do you have any difficulty performing the following activities: 05/21/2017 12/16/2016  Hearing? N N  Vision? N N  Difficulty concentrating or making decisions? N N  Walking or climbing stairs? N N  Dressing or bathing? N N  Doing errands, shopping? N -  Preparing Food and eating ? N -  Using the Toilet? N -  In the past six months, have you accidently leaked urine? N -  Do you have problems with loss of bowel control? N -  Managing your Medications? N -  Managing your Finances? N -  Housekeeping or managing your Housekeeping? N -  Some recent data might be hidden    Patient Care Team: Tower, Wynelle Fanny, MD as PCP - General Kellie Moor, Dayle Points, MD as Consulting Physician (Dermatology)    Assessment:     Hearing Screening   125Hz  250Hz  500Hz  1000Hz  2000Hz  3000Hz  4000Hz  6000Hz  8000Hz   Right ear:   40 0 0  0    Left ear:   0 0 0  0    Vision Screening Comments: Last vision exam in August 2018 with Dr. Wallace Going   Exercise Activities and Dietary recommendations Current Exercise Habits: The patient does not participate in regular exercise at present, Exercise limited by: None identified  Goals    . Increase water intake          Starting 05/21/2017, I will attempt to drink at least 6-8 glasses of water daily.       Fall Risk Fall Risk  05/21/2017 05/14/2016 06/18/2014 06/02/2013  Falls in the past year? No No No No   Depression Screen PHQ 2/9 Scores 05/21/2017 05/14/2016 06/18/2014 06/02/2013  PHQ - 2 Score 0 0 0 0  PHQ- 9 Score 0 - - -     Cognitive Function MMSE -  Mini Mental State Exam 05/21/2017 05/14/2016  Orientation to time 5 5  Orientation to Place 5 5  Registration 3 3  Attention/ Calculation 0 0  Recall 2 3  Recall-comments pt was unable to recall 1 of 3 words -  Language- name 2 objects 0 0  Language- repeat 1 1  Language- follow 3 step command 3 3  Language- read & follow direction 0 0  Write a sentence 0 0  Copy design 0 0  Total score 19 20     PLEASE NOTE: A Mini-Cog screen was completed. Maximum score is 20. A value of 0 denotes this part of [place] MMSE was not completed or the patient failed this part of the Mini-Cog screening.   Mini-Cog Screening Orientation to Time - Max 5 pts Orientation to Place - Max 5 pts Registration - Max 3 pts Recall - Max 3 pts Language Repeat - Max 1 pts Language Follow 3 Step Command - Max 3 pts     Immunization History  Administered Date(s) Administered  . Influenza Split 06/04/2011, 05/18/2012  . Influenza Whole 06/24/2007, 06/05/2008, 06/05/2009, 05/22/2010  . Influenza,inj,Quad PF,6+ Mos 05/17/2013, 05/22/2014, 07/10/2015  . Pneumococcal Conjugate-13 06/18/2014  . Pneumococcal Polysaccharide-23 11/20/2009  . Td 06/22/2001  . Tdap 12/01/2010   Screening Tests Health Maintenance  Topic Date Due  . MAMMOGRAM  07/24/2017 (Originally 07/25/2015)  . INFLUENZA VACCINE  11/28/2017 (Originally 03/31/2017)  . FOOT EXAM  05/18/2018 (Originally 05/19/2017)  . HEMOGLOBIN A1C  11/18/2017  . OPHTHALMOLOGY EXAM  03/31/2018  . TETANUS/TDAP  11/30/2020  . DEXA SCAN  Completed  . PNA vac Low Risk Adult  Completed      Plan:     I have personally reviewed and addressed the Medicare Annual Wellness questionnaire and have noted the following in the patient's chart:  A. Medical and social history B. Use of alcohol, tobacco or illicit drugs  C. Current medications and supplements D. Functional ability and status E.  Nutritional status F.  Physical activity G. Advance directives H. List of other  physicians I.  Hospitalizations, surgeries, and ER visits in previous 12 months J.  [place] to include hearing, vision, cognitive, depression L. Referrals and appointments - none  In addition, I have reviewed and discussed with patient certain preventive protocols, quality metrics, and best practice recommendations. A written personalized care plan for preventive services as well as general preventive health recommendations were provided to patient.  See attached scanned questionnaire for additional information.   Signed,   Lindell Noe, MHA, BS, LPN Health Coach

## 2017-05-21 NOTE — Patient Instructions (Signed)
Hannah Duran , Thank you for taking time to come for your Medicare Wellness Visit. I appreciate your ongoing commitment to your health goals. Please review the following plan we discussed and let me know if I can assist you in the future.   These are the goals we discussed: Goals    . Increase water intake          Starting 05/21/2017, I will attempt to drink at least 6-8 glasses of water daily.        This is a list of the screening recommended for you and due dates:  Health Maintenance  Topic Date Due  . Mammogram  07/24/2017*  . Flu Shot  11/28/2017*  . Complete foot exam   05/18/2018*  . Hemoglobin A1C  11/18/2017  . Eye exam for diabetics  03/31/2018  . Tetanus Vaccine  11/30/2020  . DEXA scan (bone density measurement)  Completed  . Pneumonia vaccines  Completed  *Topic was postponed. The date shown is not the original due date.   Preventive Care for Adults  A healthy lifestyle and preventive care can promote health and wellness. Preventive health guidelines for adults include the following key practices.  . A routine yearly physical is a good way to check with your health care provider about your health and preventive screening. It is a chance to share any concerns and updates on your health and to receive a thorough exam.  . Visit your dentist for a routine exam and preventive care every 6 months. Brush your teeth twice a day and floss once a day. Good oral hygiene prevents tooth decay and gum disease.  . The frequency of eye exams is based on your age, health, family medical history, use  of contact lenses, and other factors. Follow your health care provider's ecommendations for frequency of eye exams.  . Eat a healthy diet. Foods like vegetables, fruits, whole grains, low-fat dairy products, and lean protein foods contain the nutrients you need without too many calories. Decrease your intake of foods high in solid fats, added sugars, and salt. Eat the right amount of  calories for you. Get information about a proper diet from your health care provider, if necessary.  . Regular physical exercise is one of the most important things you can do for your health. Most adults should get at least 150 minutes of moderate-intensity exercise (any activity that increases your heart rate and causes you to sweat) each week. In addition, most adults need muscle-strengthening exercises on 2 or more days a week.  Silver Sneakers may be a benefit available to you. To determine eligibility, you may visit the website: www.silversneakers.com or contact program at 601-171-0500 Mon-Fri between 8AM-8PM.   . Maintain a healthy weight. The body mass index (BMI) is a screening tool to identify possible weight problems. It provides an estimate of body fat based on height and weight. Your health care provider can find your BMI and can help you achieve or maintain a healthy weight.   For adults 20 years and older: ? A BMI below 18.5 is considered underweight. ? A BMI of 18.5 to 24.9 is normal. ? A BMI of 25 to 29.9 is considered overweight. ? A BMI of 30 and above is considered obese.   . Maintain normal blood lipids and cholesterol levels by exercising and minimizing your intake of saturated fat. Eat a balanced diet with plenty of fruit and vegetables. Blood tests for lipids and cholesterol should begin at age 55 and  be repeated every 5 years. If your lipid or cholesterol levels are high, you are over 50, or you are at high risk for heart disease, you may need your cholesterol levels checked more frequently. Ongoing high lipid and cholesterol levels should be treated with medicines if diet and exercise are not working.  . If you smoke, find out from your health care provider how to quit. If you do not use tobacco, please do not start.  . If you choose to drink alcohol, please do not consume more than 2 drinks per day. One drink is considered to be 12 ounces (355 mL) of beer, 5 ounces  (148 mL) of wine, or 1.5 ounces (44 mL) of liquor.  . If you are 66-54 years old, ask your health care provider if you should take aspirin to prevent strokes.  . Use sunscreen. Apply sunscreen liberally and repeatedly throughout the day. You should seek shade when your shadow is shorter than you. Protect yourself by wearing long sleeves, pants, a wide-brimmed hat, and sunglasses year round, whenever you are outdoors.  . Once a month, do a whole body skin exam, using a mirror to look at the skin on your back. Tell your health care provider of new moles, moles that have irregular borders, moles that are larger than a pencil eraser, or moles that have changed in shape or color.

## 2017-05-25 DIAGNOSIS — M79674 Pain in right toe(s): Secondary | ICD-10-CM | POA: Diagnosis not present

## 2017-05-25 DIAGNOSIS — B351 Tinea unguium: Secondary | ICD-10-CM | POA: Diagnosis not present

## 2017-05-25 DIAGNOSIS — M79675 Pain in left toe(s): Secondary | ICD-10-CM | POA: Diagnosis not present

## 2017-05-25 DIAGNOSIS — L600 Ingrowing nail: Secondary | ICD-10-CM | POA: Diagnosis not present

## 2017-05-28 ENCOUNTER — Ambulatory Visit (INDEPENDENT_AMBULATORY_CARE_PROVIDER_SITE_OTHER): Payer: PPO | Admitting: Family Medicine

## 2017-05-28 ENCOUNTER — Encounter: Payer: Self-pay | Admitting: Family Medicine

## 2017-05-28 VITALS — BP 124/78 | HR 66 | Temp 98.4°F | Ht 64.75 in | Wt 246.5 lb

## 2017-05-28 DIAGNOSIS — Z1231 Encounter for screening mammogram for malignant neoplasm of breast: Secondary | ICD-10-CM | POA: Diagnosis not present

## 2017-05-28 DIAGNOSIS — E7800 Pure hypercholesterolemia, unspecified: Secondary | ICD-10-CM | POA: Diagnosis not present

## 2017-05-28 DIAGNOSIS — I10 Essential (primary) hypertension: Secondary | ICD-10-CM

## 2017-05-28 DIAGNOSIS — Z0000 Encounter for general adult medical examination without abnormal findings: Secondary | ICD-10-CM | POA: Diagnosis not present

## 2017-05-28 DIAGNOSIS — E119 Type 2 diabetes mellitus without complications: Secondary | ICD-10-CM | POA: Diagnosis not present

## 2017-05-28 MED ORDER — METFORMIN HCL 500 MG PO TABS
500.0000 mg | ORAL_TABLET | Freq: Two times a day (BID) | ORAL | 3 refills | Status: DC
Start: 2017-05-28 — End: 2018-05-31

## 2017-05-28 MED ORDER — ATORVASTATIN CALCIUM 10 MG PO TABS
10.0000 mg | ORAL_TABLET | Freq: Every day | ORAL | 3 refills | Status: DC
Start: 2017-05-28 — End: 2018-05-31

## 2017-05-28 MED ORDER — FUROSEMIDE 20 MG PO TABS: 20.0000 mg | ORAL_TABLET | Freq: Every day | ORAL | 3 refills | Status: DC

## 2017-05-28 MED ORDER — LOSARTAN POTASSIUM-HCTZ 50-12.5 MG PO TABS: 1.0000 | ORAL_TABLET | Freq: Every day | ORAL | 3 refills | Status: DC

## 2017-05-28 NOTE — Progress Notes (Signed)
Subjective:    Patient ID: Hannah Duran, female    DOB: 08-20-1934, 83 y.o.   MRN: [id number]  HPI  Here for health maintenance exam and to review chronic medical problems    Doing ok overall  Did outdoor work - raking leaves   [employer] from Last 3 Encounters:  05/28/17 246 lb 8 oz (111.8 kg)  05/21/17 248 lb (112.5 kg)  12/16/16 247 lb (112 kg)  wt is down 2lb  Outdoor work / but does not walk  Lots of mowing and weeding  Eating is terrible - she does not cook anymore (tries to eat out where they offer more vegetables)  41.34 kg/m   Was given 2 cakes for her birthday  Not a big sweet eater in general    Had amw on 9/21 Failed hearing screen both ears- not bothersome to her  19/20 mini cog (unable to recall one of 3 words) She notes she misplaces things occasionally  No concerns   Mammogram 11/15 - she needs to get a mammogram  Self breast exam - no lumps   Flu shot - does not want one yet   Eye exam 8/18 (per pt) -has had 2 cataract surgeries in addition  It helped Still needs readers    dexa -declines in the past Wants to postpone that  Is very careful  No falls  No fractures   Colonoscopy 5/13- had a mucosal ulceration (ischemic colitis) GI doctor did not think she needed a recall  No symptoms  Declines colon cancer screening of any kind   bp is stable today  No cp or palpitations or headaches or edema  No side effects to medicines  BP Readings from Last 3 Encounters:  05/28/17 124/78  05/21/17 108/64  12/16/16 110/76      DM2 Lab Results  Component Value Date   HGBA1C 7.0 (H) 05/21/2017  this is up  Not interested in diabetic ed  On arb for renal protection  Diet controlled  Eye exam aug  lipitor for lipids   Hyperlipidemia  Lab Results  Component Value Date   CHOL 163 05/21/2017   CHOL 146 05/14/2016   CHOL 151 06/11/2014   Lab Results  Component Value Date   HDL 32.10 (L) 05/21/2017   HDL 35.10 (L) 05/14/2016   HDL 30.80  (L) 06/11/2014   Lab Results  Component Value Date   LDLCALC 102 (H) 05/21/2017   LDLCALC 85 05/14/2016   LDLCALC 93 06/11/2014   Lab Results  Component Value Date   TRIG 145.0 05/21/2017   TRIG 132.0 05/14/2016   TRIG 137.0 06/11/2014   Lab Results  Component Value Date   CHOLHDL 5 05/21/2017   CHOLHDL 4 05/14/2016   CHOLHDL 5 06/11/2014   No results found for: LDLDIRECT On atorvastatin and diet  LDL is up  No red meat  Fried food/ chick filet -every 2 weeks  She does eat a sausage biscuit from fast food  Not a lot of cheese    Lab Results  Component Value Date   WBC 8.1 05/21/2017   HGB 12.2 05/21/2017   HCT 37.7 05/21/2017   MCV 87.4 05/21/2017   PLT 311.0 05/21/2017   Lab Results  Component Value Date   CREATININE 0.72 05/21/2017   BUN 13 05/21/2017   NA 141 05/21/2017   K 3.6 05/21/2017   CL 102 05/21/2017   CO2 31 05/21/2017   Lab Results  Component Value Date  ALT 14 05/21/2017   AST 18 05/21/2017   ALKPHOS 90 05/21/2017   BILITOT 0.7 05/21/2017   Lab Results  Component Value Date   TSH 1.97 05/21/2017    Patient Active Problem List   Diagnosis Date Noted  . Encounter for screening mammogram for breast cancer 05/28/2017  . Routine general medical examination at a health care facility 05/19/2016  . Ankle edema 05/01/2015  . Venous (peripheral) insufficiency 05/01/2015  . Cutaneous skin tags 05/23/2014  . Skin lesion of face 05/23/2014  . Fungal nail infection 09/05/2013  . Encounter for Medicare annual wellness exam 06/02/2013  . Mucous cyst of finger 11/30/2012  . Internal hemorrhoid 06/21/2012  . Morbid obesity ([place])   . HTN (hypertension)   . HLD (hyperlipidemia)   . Diabetes type 2, controlled ([place])    Past Medical History:  Diagnosis Date  . Congenital foot deformity   . Dermatophytosis   . HLD (hyperlipidemia)   . HTN (hypertension)   . Hyperglycemia   . Irregular heart rhythm    pt unsure of what type; followed by PCP  only  . Ischemic colitis ([place]) 7/13   on colonoscopy  . Obesity   . Rotator cuff syndrome    Past Surgical History:  Procedure Laterality Date  . CATARACT EXTRACTION W/PHACO Right 09/09/2016   Procedure: CATARACT EXTRACTION PHACO AND INTRAOCULAR LENS PLACEMENT (IOC);  Surgeon: Leandrew Koyanagi, MD;  Location: [facility];  Service: Ophthalmology;  Laterality: Right;  right  . CATARACT EXTRACTION W/PHACO Left 12/16/2016   Procedure: CATARACT EXTRACTION PHACO AND INTRAOCULAR LENS PLACEMENT ([place])  Left;  Surgeon: Leandrew Koyanagi, MD;  Location: [facility];  Service: Ophthalmology;  Laterality: Left;  . HUMERUS FRACTURE SURGERY  10/1997  . TOTAL ABDOMINAL HYSTERECTOMY  1980's   due to menometrorrhagia   Social History  Substance Use Topics  . Smoking status: Never Smoker  . Smokeless tobacco: Never Used  . Alcohol use No   Family History  Problem Relation Age of Onset  . Stroke Mother   . Parkinsonism Mother   . Dementia Mother   . Hypertension Mother   . Cancer Father        bladder   Allergies  Allergen Reactions  . Penicillins     REACTION: Rash  . Sulfamethoxazole-Trimethoprim     rash   No current outpatient prescriptions on file prior to visit.   No current facility-administered medications on file prior to visit.      Review of Systems  Constitutional: Positive for fatigue. Negative for activity change, appetite change, fever and unexpected weight change.  HENT: Negative for congestion, ear pain, rhinorrhea, sinus pressure and sore throat.   Eyes: Negative for pain, redness and visual disturbance.  Respiratory: Negative for cough, shortness of breath and wheezing.   Cardiovascular: Negative for chest pain and palpitations.  Gastrointestinal: Negative for abdominal pain, blood in stool, constipation and diarrhea.  Endocrine: Negative for polydipsia and polyuria.  Genitourinary: Negative for dysuria, frequency and urgency.  Musculoskeletal:  Positive for arthralgias. Negative for back pain and myalgias.  Skin: Negative for pallor and rash.  Allergic/Immunologic: Negative for environmental allergies.  Neurological: Negative for dizziness, syncope and headaches.  Hematological: Negative for adenopathy. Does not bruise/bleed easily.  Psychiatric/Behavioral: Negative for decreased concentration and dysphoric mood. The patient is not nervous/anxious.        Objective:   Physical Exam  Constitutional: She appears well-developed and well-nourished. No distress.  obese and well appearing   HENT:  Head: Normocephalic and atraumatic.  Right Ear: External ear normal.  Left Ear: External ear normal.  Mouth/Throat: Oropharynx is clear and moist.  Eyes: Pupils are equal, round, and reactive to light. Conjunctivae and EOM are normal. No scleral icterus.  Neck: Normal range of motion. Neck supple. No JVD present. Carotid bruit is not present. No thyromegaly present.  Cardiovascular: Normal rate, regular rhythm, normal heart sounds and intact distal pulses.  Exam reveals no gallop.   Pulmonary/Chest: Effort normal and breath sounds normal. No respiratory distress. She has no wheezes. She exhibits no tenderness.  Abdominal: Soft. Bowel sounds are normal. She exhibits no distension, no abdominal bruit and no mass. There is no tenderness.  Genitourinary: No breast swelling, tenderness, discharge or bleeding.  Genitourinary Comments: Breast exam: No mass, nodules, thickening, tenderness, bulging, retraction, inflamation, nipple discharge or skin changes noted.  No axillary or clavicular LA.      Musculoskeletal: Normal range of motion. She exhibits no edema or tenderness.  Lymphadenopathy:    She has no cervical adenopathy.  Neurological: She is alert. She has normal reflexes. No cranial nerve deficit. She exhibits normal muscle tone. Coordination normal.  Skin: Skin is warm and dry. No rash noted. No erythema. No pallor.  Skin tags diffusely    Some lentigines   Thickened toe nails  Psychiatric: She has a normal mood and affect.          Assessment & Plan:   Problem List Items Addressed This Visit      Cardiovascular and Mediastinum   HTN (hypertension) - Primary    bp in fair control at this time  BP Readings from Last 1 Encounters:  05/28/17 124/78   No changes needed Disc lifstyle change with low sodium diet and exercise  Labs reviewed  Wt loss enc       Relevant Medications   losartan-hydrochlorothiazide (HYZAAR) 50-12.5 MG tablet   furosemide (LASIX) 20 MG tablet   atorvastatin (LIPITOR) 10 MG tablet     Endocrine   Diabetes type 2, controlled ([place])    Lab Results  Component Value Date   HGBA1C 7.0 (H) 05/21/2017   This is up  Will start metformin 500 mg bid /disc poss side eff  Diet reviewed as well as need for weight loss  Eye and foot care disc  F/u 3 mo      Relevant Medications   metFORMIN (GLUCOPHAGE) 500 MG tablet   losartan-hydrochlorothiazide (HYZAAR) 50-12.5 MG tablet   atorvastatin (LIPITOR) 10 MG tablet     Other   Encounter for screening mammogram for breast cancer    Scheduled annual screening mammogram Nl breast exam today  Encouraged monthly self exams        Relevant Orders   MM DIGITAL SCREENING BILATERAL   HLD (hyperlipidemia)    Disc goals for lipids and reasons to control them Rev labs with pt Rev low sat fat diet in detail LDL is up  Disc diet change  On atorvastatin  Disc goal of LDL  Will re check 3 mo      Relevant Medications   losartan-hydrochlorothiazide (HYZAAR) 50-12.5 MG tablet   furosemide (LASIX) 20 MG tablet   atorvastatin (LIPITOR) 10 MG tablet   Morbid obesity ([place])    Discussed how this problem influences overall health and the risks it imposes  Reviewed plan for weight loss with lower calorie diet (via better food choices and also portion control or program like weight watchers) and exercise building up to  or more than 30 minutes 5 days  per week including some aerobic activity         Relevant Medications   metFORMIN (GLUCOPHAGE) 500 MG tablet   Routine general medical examination at a health care facility    Reviewed health habits including diet and exercise and skin cancer prevention Reviewed appropriate screening tests for age  Also reviewed health mt list, fam hx and immunization status , as well as social and family history   See HPI Rev amw (she is not concerned about hearing ) Labs rev  Disc need for wt loss  Declines any colon cancer screening Keep an eye on your memory  Socializing is important to retain memory as well as puzzles/math and reading   Get your flu shot this fall (pt wants to put this off another month)  Stop at check out for a mammogram referral

## 2017-05-28 NOTE — Patient Instructions (Addendum)
Keep an eye on your memory  Socializing is important to retain memory as well as puzzles/math and reading   Get your flu shot this fall   Stop at check out for a mammogram referral    Cholesterol is up  Avoid red meat/ fried foods/ egg yolks/ fatty breakfast meats/ butter, cheese and high fat dairy/ and shellfish    Your blood sugar is up  You are diabetic  Try to get most of your carbohydrates from produce (with the exception of white potatoes)  Eat less bread/pasta/rice/snack foods/cereals/sweets and other items from the middle of the grocery store (processed carbs)   Start metformin twice daily  If any intolerable side effects let us know  Follow up in 3 months   Keep working on weight loss

## 2017-05-30 NOTE — Assessment & Plan Note (Signed)
Disc goals for lipids and reasons to control them Rev labs with pt Rev low sat fat diet in detail LDL is up  Disc diet change  On atorvastatin  Disc goal of LDL  Will re check 3 mo

## 2017-05-30 NOTE — Assessment & Plan Note (Signed)
Reviewed health habits including diet and exercise and skin cancer prevention Reviewed appropriate screening tests for age  Also reviewed health mt list, fam hx and immunization status , as well as social and family history   See HPI Rev amw (she is not concerned about hearing ) Labs rev  Disc need for wt loss  Declines any colon cancer screening Keep an eye on your memory  Socializing is important to retain memory as well as puzzles/math and reading   Get your flu shot this fall (pt wants to put this off another month)  Stop at check out for a mammogram referral

## 2017-05-30 NOTE — Assessment & Plan Note (Signed)
Lab Results  Component Value Date   HGBA1C 7.0 (H) 05/21/2017   This is up  Will start metformin 500 mg bid /disc poss side eff  Diet reviewed as well as need for weight loss  Eye and foot care disc  F/u 3 mo

## 2017-05-30 NOTE — Assessment & Plan Note (Signed)
Scheduled annual screening mammogram Nl breast exam today  Encouraged monthly self exams   

## 2017-05-30 NOTE — Assessment & Plan Note (Signed)
Discussed how this problem influences overall health and the risks it imposes  Reviewed plan for weight loss with lower calorie diet (via better food choices and also portion control or program like weight watchers) and exercise building up to or more than 30 minutes 5 days per week including some aerobic activity    

## 2017-05-30 NOTE — Assessment & Plan Note (Signed)
bp in fair control at this time  BP Readings from Last 1 Encounters:  05/28/17 124/78   No changes needed Disc lifstyle change with low sodium diet and exercise  Labs reviewed  Wt loss enc

## 2017-08-02 DIAGNOSIS — D3131 Benign neoplasm of right choroid: Secondary | ICD-10-CM | POA: Diagnosis not present

## 2017-08-02 DIAGNOSIS — E119 Type 2 diabetes mellitus without complications: Secondary | ICD-10-CM | POA: Diagnosis not present

## 2017-08-02 LAB — HM DIABETES EYE EXAM

## 2017-08-27 ENCOUNTER — Encounter: Payer: Self-pay | Admitting: Family Medicine

## 2017-08-27 ENCOUNTER — Ambulatory Visit: Payer: PPO | Admitting: Family Medicine

## 2017-08-27 VITALS — BP 128/88 | HR 56 | Temp 97.9°F | Ht 64.75 in | Wt 238.8 lb

## 2017-08-27 DIAGNOSIS — E119 Type 2 diabetes mellitus without complications: Secondary | ICD-10-CM | POA: Diagnosis not present

## 2017-08-27 DIAGNOSIS — I10 Essential (primary) hypertension: Secondary | ICD-10-CM | POA: Diagnosis not present

## 2017-08-27 DIAGNOSIS — E7800 Pure hypercholesterolemia, unspecified: Secondary | ICD-10-CM

## 2017-08-27 LAB — BASIC METABOLIC PANEL
BUN: 15 mg/dL (ref 6–23)
CALCIUM: 8.9 mg/dL (ref 8.4–10.5)
CO2: 32 mEq/L (ref 19–32)
CREATININE: 0.78 mg/dL (ref 0.40–1.20)
Chloride: 102 mEq/L (ref 96–112)
GFR: 75.27 mL/min (ref >60.00)
GLUCOSE: 89 mg/dL (ref 70–99)
Potassium: 3.9 mEq/L (ref 3.5–5.1)
SODIUM: 140 meq/L (ref 135–145)

## 2017-08-27 LAB — LIPID PANEL
Cholesterol: 150 mg/dL (ref 0–200)
HDL: 34.3 mg/dL — AB (ref >39.00)
LDL Cholesterol: 86 mg/dL (ref 0–99)
NONHDL: 115.5
Total CHOL/HDL Ratio: 4
Triglycerides: 150 mg/dL — ABNORMAL HIGH (ref 0.0–149.0)
VLDL: 30 mg/dL (ref 0.0–40.0)

## 2017-08-27 LAB — HEMOGLOBIN A1C: Hgb A1c MFr Bld: 6.5 % (ref 4.6–6.5)

## 2017-08-27 NOTE — Assessment & Plan Note (Signed)
Due for lipids today  Disc goals for lipids and reasons to control them Rev labs with pt Rev low sat fat diet in detail On atorvastatin 10 mg daily

## 2017-08-27 NOTE — Assessment & Plan Note (Signed)
A1C today and bmp Expect improvement with wt loss (lifestyle change) as well as metformin Enc her to add exercise when she can

## 2017-08-27 NOTE — Progress Notes (Signed)
Subjective:    Patient ID: Hannah Duran, female    DOB: 07/14/1934, 83 y.o.   MRN: [id number]  HPI Here for f/u of chronic medical problems   Wt Readings from Last 3 Encounters:  08/27/17 238 lb 12 oz (108.3 kg)  05/28/17 246 lb 8 oz (111.8 kg)  05/21/17 248 lb (112.5 kg)  lost 8 lb since last visit  Cut out sweets !  It is hard for her  Exercise- not as much as she would like to /now will have time  40.04 kg/m  bp is stable today  No cp or palpitations or headaches or edema  No side effects to medicines  BP Readings from Last 3 Encounters:  08/27/17 128/88  05/28/17 124/78  05/21/17 108/64     DM2 Lab Results  Component Value Date   HGBA1C 7.0 (H) 05/21/2017  eating less sweets  Not as hungry now - eating less in general  Wt is down 8 lb  Needs to eat more vegetables  This is due for re check  Started metformin 500 mg last visit bid - no problems with it  utd eye exam  Had cataract surgery also and it went well   Cholesterol Lab Results  Component Value Date   CHOL 163 05/21/2017   HDL 32.10 (L) 05/21/2017   LDLCALC 102 (H) 05/21/2017   TRIG 145.0 05/21/2017   CHOLHDL 5 05/21/2017  on lipitor 10 mg daily -no problems with that  Due for re check   Patient Active Problem List   Diagnosis Date Noted  . Encounter for screening mammogram for breast cancer 05/28/2017  . Routine general medical examination at a health care facility 05/19/2016  . Ankle edema 05/01/2015  . Venous (peripheral) insufficiency 05/01/2015  . Cutaneous skin tags 05/23/2014  . Skin lesion of face 05/23/2014  . Fungal nail infection 09/05/2013  . Encounter for Medicare annual wellness exam 06/02/2013  . Mucous cyst of finger 11/30/2012  . Internal hemorrhoid 06/21/2012  . Morbid obesity ([place])   . HTN (hypertension)   . HLD (hyperlipidemia)   . Diabetes type 2, controlled ([place])    Past Medical History:  Diagnosis Date  . Congenital foot deformity   . Dermatophytosis   . HLD  (hyperlipidemia)   . HTN (hypertension)   . Hyperglycemia   . Irregular heart rhythm    pt unsure of what type; followed by PCP only  . Ischemic colitis ([place]) 7/13   on colonoscopy  . Obesity   . Rotator cuff syndrome    Past Surgical History:  Procedure Laterality Date  . CATARACT EXTRACTION W/PHACO Right 09/09/2016   Procedure: CATARACT EXTRACTION PHACO AND INTRAOCULAR LENS PLACEMENT (IOC);  Surgeon: Leandrew Koyanagi, MD;  Location: [facility];  Service: Ophthalmology;  Laterality: Right;  right  . CATARACT EXTRACTION W/PHACO Left 12/16/2016   Procedure: CATARACT EXTRACTION PHACO AND INTRAOCULAR LENS PLACEMENT ([place])  Left;  Surgeon: Leandrew Koyanagi, MD;  Location: [facility];  Service: Ophthalmology;  Laterality: Left;  . HUMERUS FRACTURE SURGERY  10/1997  . TOTAL ABDOMINAL HYSTERECTOMY  1980's   due to menometrorrhagia   Social History   Tobacco Use  . Smoking status: Never Smoker  . Smokeless tobacco: Never Used  Substance Use Topics  . Alcohol use: No  . Drug use: No   Family History  Problem Relation Age of Onset  . Stroke Mother   . Parkinsonism Mother   . Dementia Mother   . Hypertension Mother   .  Cancer Father        bladder   Allergies  Allergen Reactions  . Penicillins     REACTION: Rash  . Sulfamethoxazole-Trimethoprim     rash   Current Outpatient Medications on File Prior to Visit  Medication Sig Dispense Refill  . atorvastatin (LIPITOR) 10 MG tablet Take 1 tablet (10 mg total) by mouth daily. 90 tablet 3  . furosemide (LASIX) 20 MG tablet Take 1 tablet (20 mg total) by mouth daily. 90 tablet 3  . losartan-hydrochlorothiazide (HYZAAR) 50-12.5 MG tablet Take 1 tablet by mouth daily. 90 tablet 3  . metFORMIN (GLUCOPHAGE) 500 MG tablet Take 1 tablet (500 mg total) by mouth 2 (two) times daily with a meal. 180 tablet 3   No current facility-administered medications on file prior to visit.      Review of Systems  Constitutional:  Negative for activity change, appetite change, fatigue, fever and unexpected weight change.  HENT: Negative for congestion, ear pain, rhinorrhea, sinus pressure and sore throat.   Eyes: Negative for pain, redness and visual disturbance.  Respiratory: Negative for cough, shortness of breath and wheezing.   Cardiovascular: Positive for leg swelling. Negative for chest pain and palpitations.       Chronic ankle edema  Gastrointestinal: Negative for abdominal pain, blood in stool, constipation and diarrhea.  Endocrine: Negative for polydipsia and polyuria.  Genitourinary: Negative for dysuria, frequency and urgency.  Musculoskeletal: Negative for arthralgias, back pain and myalgias.  Skin: Negative for pallor and rash.  Allergic/Immunologic: Negative for environmental allergies.  Neurological: Negative for dizziness, syncope and headaches.  Hematological: Negative for adenopathy. Does not bruise/bleed easily.  Psychiatric/Behavioral: Negative for decreased concentration and dysphoric mood. The patient is not nervous/anxious.        Objective:   Physical Exam  Constitutional: She appears well-developed and well-nourished. No distress.  obese and well appearing   HENT:  Head: Normocephalic and atraumatic.  Mouth/Throat: Oropharynx is clear and moist.  Eyes: Conjunctivae and EOM are normal. Pupils are equal, round, and reactive to light.  Neck: Normal range of motion. Neck supple. No JVD present. Carotid bruit is not present. No thyromegaly present.  Cardiovascular: Normal rate, regular rhythm, normal heart sounds and intact distal pulses. Exam reveals no gallop.  Pulmonary/Chest: Effort normal and breath sounds normal. No respiratory distress. She has no wheezes. She has no rales.  No crackles  Abdominal: She exhibits no abdominal bruit.  Musculoskeletal: She exhibits edema. She exhibits no tenderness.  Trace pedal edema   Lymphadenopathy:    She has no cervical adenopathy.    Neurological: She is alert. She has normal reflexes.  Skin: Skin is warm and dry. No rash noted.  Psychiatric: She has a normal mood and affect.  Pleasant with good mood           Assessment & Plan:   Problem List Items Addressed This Visit      Cardiovascular and Mediastinum   HTN (hypertension)    bp in fair control at this time  BP Readings from Last 1 Encounters:  08/27/17 128/88   No changes needed Disc lifstyle change with low sodium diet and exercise  Labs today  Enc further wt loss         Endocrine   Diabetes type 2, controlled ([place]) - Primary    A1C today and bmp Expect improvement with wt loss (lifestyle change) as well as metformin Enc her to add exercise when she can  Other   HLD (hyperlipidemia)    Due for lipids today  Disc goals for lipids and reasons to control them Rev labs with pt Rev low sat fat diet in detail On atorvastatin 10 mg daily       Morbid obesity ([place])    Discussed how this problem influences overall health and the risks it imposes  Reviewed plan for weight loss with lower calorie diet (via better food choices and also portion control or program like weight watchers) and exercise building up to or more than 30 minutes 5 days per week including some aerobic activity   Commended on 8 lb lost so far! Will add exercise when she can /likes to walk  Eating less carbs and no sweets

## 2017-08-27 NOTE — Assessment & Plan Note (Signed)
bp in fair control at this time  BP Readings from Last 1 Encounters:  08/27/17 128/88   No changes needed Disc lifstyle change with low sodium diet and exercise  Labs today  Enc further wt loss

## 2017-08-27 NOTE — Patient Instructions (Signed)
Take care of yourself   Great job with weight loss and diet  Start exercise when you can   Lab today for diabetes and cholesterol  Will make a plan from there

## 2017-08-27 NOTE — Assessment & Plan Note (Signed)
Discussed how this problem influences overall health and the risks it imposes  Reviewed plan for weight loss with lower calorie diet (via better food choices and also portion control or program like weight watchers) and exercise building up to or more than 30 minutes 5 days per week including some aerobic activity   Commended on 8 lb lost so far! Will add exercise when she can /likes to walk  Eating less carbs and no sweets

## 2017-08-30 ENCOUNTER — Encounter: Payer: Self-pay | Admitting: *Deleted

## 2017-11-23 DIAGNOSIS — L821 Other seborrheic keratosis: Secondary | ICD-10-CM | POA: Diagnosis not present

## 2018-01-11 DIAGNOSIS — H2000 Unspecified acute and subacute iridocyclitis: Secondary | ICD-10-CM | POA: Diagnosis not present

## 2018-01-18 DIAGNOSIS — H2000 Unspecified acute and subacute iridocyclitis: Secondary | ICD-10-CM | POA: Diagnosis not present

## 2018-01-19 DIAGNOSIS — M79675 Pain in left toe(s): Secondary | ICD-10-CM | POA: Diagnosis not present

## 2018-01-19 DIAGNOSIS — M79674 Pain in right toe(s): Secondary | ICD-10-CM | POA: Diagnosis not present

## 2018-01-19 DIAGNOSIS — B351 Tinea unguium: Secondary | ICD-10-CM | POA: Diagnosis not present

## 2018-02-04 DIAGNOSIS — H2000 Unspecified acute and subacute iridocyclitis: Secondary | ICD-10-CM | POA: Diagnosis not present

## 2018-03-21 ENCOUNTER — Other Ambulatory Visit: Payer: Self-pay | Admitting: *Deleted

## 2018-03-21 MED ORDER — HYDROCHLOROTHIAZIDE 12.5 MG PO CAPS: 12.5000 mg | ORAL_CAPSULE | Freq: Every day | ORAL | 0 refills | Status: DC

## 2018-03-21 MED ORDER — LOSARTAN POTASSIUM 50 MG PO TABS: 50.0000 mg | ORAL_TABLET | Freq: Every day | ORAL | 0 refills | Status: DC

## 2018-03-21 NOTE — Telephone Encounter (Signed)
Received a fax from [facility] requesting refill for separate Losartan 50 mg and HCTZ 12.5 mg as the combo is on back order. Rxs sent. See meds.

## 2018-05-06 ENCOUNTER — Other Ambulatory Visit: Payer: Self-pay | Admitting: Pharmacist

## 2018-05-06 NOTE — Patient Outreach (Signed)
[facility]) Care Management  05/06/2018  Hannah Duran [birth year] [id number]   Incoming call from Hannah Duran in response to the [facility] Medication Adherence Campaign. Speak with patient. HIPAA identifiers verified and verbal consent received.  [name] reports that she takes her metformin 500 mg twice daily with meals as directed. Denies any missed doses or barriers to adherence. Reports that she keeps her metformin on the table where she eats to ensure that she does not miss a dose. Reports that she also always takes it with her when she eats breakfast or supper outside of the home. Counsel on the importance of medication adherence. Note that patient's most recent A1C per Epic was 6.5% on 08/27/17.  Patient denies any further medication questions/concerns at this time. Will close pharmacy episode.   Harlow Asa, PharmD, [facility] [phone]

## 2018-05-22 ENCOUNTER — Telehealth: Payer: Self-pay | Admitting: Family Medicine

## 2018-05-22 DIAGNOSIS — I10 Essential (primary) hypertension: Secondary | ICD-10-CM

## 2018-05-22 DIAGNOSIS — E119 Type 2 diabetes mellitus without complications: Secondary | ICD-10-CM

## 2018-05-22 DIAGNOSIS — Z0000 Encounter for general adult medical examination without abnormal findings: Secondary | ICD-10-CM

## 2018-05-22 DIAGNOSIS — E7800 Pure hypercholesterolemia, unspecified: Secondary | ICD-10-CM

## 2018-05-22 NOTE — Telephone Encounter (Signed)
-----   Message from Eustace Pen, LPN sent at 7/49/4496 12:01 PM EDT ----- Regarding: Labs 9/25 Lab orders needed. Thank you.  Insurance:  [employer]

## 2018-05-25 ENCOUNTER — Ambulatory Visit (INDEPENDENT_AMBULATORY_CARE_PROVIDER_SITE_OTHER): Payer: PPO

## 2018-05-25 VITALS — BP 114/78 | HR 73 | Temp 98.1°F | Ht 64.5 in | Wt 241.5 lb

## 2018-05-25 DIAGNOSIS — Z0000 Encounter for general adult medical examination without abnormal findings: Secondary | ICD-10-CM

## 2018-05-25 DIAGNOSIS — E7800 Pure hypercholesterolemia, unspecified: Secondary | ICD-10-CM | POA: Diagnosis not present

## 2018-05-25 DIAGNOSIS — E119 Type 2 diabetes mellitus without complications: Secondary | ICD-10-CM | POA: Diagnosis not present

## 2018-05-25 DIAGNOSIS — I10 Essential (primary) hypertension: Secondary | ICD-10-CM | POA: Diagnosis not present

## 2018-05-25 LAB — CBC WITH DIFFERENTIAL/PLATELET
BASOS ABS: 0.1 10*3/uL (ref 0.0–0.1)
Basophils Relative: 1.1 % (ref 0.0–3.0)
EOS ABS: 0.2 10*3/uL (ref 0.0–0.7)
Eosinophils Relative: 2.2 % (ref 0.0–5.0)
HCT: 38.1 % (ref 36.0–46.0)
Hemoglobin: 12.6 g/dL (ref 12.0–15.0)
LYMPHS ABS: 1.6 10*3/uL (ref 0.7–4.0)
Lymphocytes Relative: 18 % (ref 12.0–46.0)
MCHC: 33.2 g/dL (ref 30.0–36.0)
MCV: 87 fl (ref 78.0–100.0)
MONO ABS: 0.8 10*3/uL (ref 0.1–1.0)
MONOS PCT: 9.1 % (ref 3.0–12.0)
NEUTROS PCT: 69.6 % (ref 43.0–77.0)
Neutro Abs: 6 10*3/uL (ref 1.4–7.7)
PLATELETS: 325 10*3/uL (ref 150.0–400.0)
RBC: 4.38 Mil/uL (ref 3.87–5.11)
RDW: 15.2 % (ref 11.5–15.5)
WBC: 8.7 10*3/uL (ref 4.0–10.5)

## 2018-05-25 LAB — COMPREHENSIVE METABOLIC PANEL
ALK PHOS: 92 U/L (ref 39–117)
ALT: 15 U/L (ref 0–35)
AST: 18 U/L (ref 0–37)
Albumin: 4.2 g/dL (ref 3.5–5.2)
BUN: 18 mg/dL (ref 6–23)
CO2: 29 mEq/L (ref 19–32)
Calcium: 9.6 mg/dL (ref 8.4–10.5)
Chloride: 103 mEq/L (ref 96–112)
Creatinine, Ser: 0.85 mg/dL (ref 0.40–1.20)
GFR: 68.04 mL/min (ref >60.00)
GLUCOSE: 115 mg/dL — AB (ref 70–99)
Potassium: 4.1 mEq/L (ref 3.5–5.1)
Sodium: 140 mEq/L (ref 135–145)
TOTAL PROTEIN: 7.1 g/dL (ref 6.0–8.3)
Total Bilirubin: 0.8 mg/dL (ref 0.2–1.2)

## 2018-05-25 LAB — LIPID PANEL
Cholesterol: 209 mg/dL — ABNORMAL HIGH (ref 0–200)
HDL: 37.8 mg/dL — ABNORMAL LOW (ref >39.00)
LDL Cholesterol: 141 mg/dL — ABNORMAL HIGH (ref 0–99)
NonHDL: 171.02
Total CHOL/HDL Ratio: 6
Triglycerides: 150 mg/dL — ABNORMAL HIGH (ref 0.0–149.0)
VLDL: 30 mg/dL (ref 0.0–40.0)

## 2018-05-25 LAB — TSH: TSH: 3.63 u[IU]/mL (ref 0.35–4.50)

## 2018-05-25 LAB — HEMOGLOBIN A1C: HEMOGLOBIN A1C: 6.6 % — AB (ref 4.6–6.5)

## 2018-05-25 NOTE — Patient Instructions (Signed)
Hannah Duran , Thank you for taking time to come for your Medicare Wellness Visit. I appreciate your ongoing commitment to your health goals. Please review the following plan we discussed and let me know if I can assist you in the future.   These are the goals we discussed: Goals    . Increase water intake     Starting 05/25/2018, I will attempt to drink at least 6-8 glasses of water daily.        This is a list of the screening recommended for you and due dates:  Health Maintenance  Topic Date Due  . Mammogram  08/30/2018*  . Flu Shot  11/30/2018*  . Complete foot exam   05/28/2018  . Eye exam for diabetics  08/02/2018  . Hemoglobin A1C  11/23/2018  . Tetanus Vaccine  11/30/2020  . DEXA scan (bone density measurement)  Completed  . Pneumonia vaccines  Completed  *Topic was postponed. The date shown is not the original due date.   Preventive Care for Adults  A healthy lifestyle and preventive care can promote health and wellness. Preventive health guidelines for adults include the following key practices.  . A routine yearly physical is a good way to check with your health care provider about your health and preventive screening. It is a chance to share any concerns and updates on your health and to receive a thorough exam.  . Visit your dentist for a routine exam and preventive care every 6 months. Brush your teeth twice a day and floss once a day. Good oral hygiene prevents tooth decay and gum disease.  . The frequency of eye exams is based on your age, health, family medical history, use  of contact lenses, and other factors. Follow your health care provider's recommendations for frequency of eye exams.  . Eat a healthy diet. Foods like vegetables, fruits, whole grains, low-fat dairy products, and lean protein foods contain the nutrients you need without too many calories. Decrease your intake of foods high in solid fats, added sugars, and salt. Eat the right amount of calories  for you. Get information about a proper diet from your health care provider, if necessary.  . Regular physical exercise is one of the most important things you can do for your health. Most adults should get at least 150 minutes of moderate-intensity exercise (any activity that increases your heart rate and causes you to sweat) each week. In addition, most adults need muscle-strengthening exercises on 2 or more days a week.  Silver Sneakers may be a benefit available to you. To determine eligibility, you may visit the website: www.silversneakers.com or contact program at 647-268-6447 Mon-Fri between 8AM-8PM.   . Maintain a healthy weight. The body mass index (BMI) is a screening tool to identify possible weight problems. It provides an estimate of body fat based on height and weight. Your health care provider can find your BMI and can help you achieve or maintain a healthy weight.   For adults 20 years and older: ? A BMI below 18.5 is considered underweight. ? A BMI of 18.5 to 24.9 is normal. ? A BMI of 25 to 29.9 is considered overweight. ? A BMI of 30 and above is considered obese.   . Maintain normal blood lipids and cholesterol levels by exercising and minimizing your intake of saturated fat. Eat a balanced diet with plenty of fruit and vegetables. Blood tests for lipids and cholesterol should begin at age 22 and be repeated every 5 years.  If your lipid or cholesterol levels are high, you are over 50, or you are at high risk for heart disease, you may need your cholesterol levels checked more frequently. Ongoing high lipid and cholesterol levels should be treated with medicines if diet and exercise are not working.  . If you smoke, find out from your health care provider how to quit. If you do not use tobacco, please do not start.  . If you choose to drink alcohol, please do not consume more than 2 drinks per day. One drink is considered to be 12 ounces (355 mL) of beer, 5 ounces (148 mL) of  wine, or 1.5 ounces (44 mL) of liquor.  . If you are 71-75 years old, ask your health care provider if you should take aspirin to prevent strokes.  . Use sunscreen. Apply sunscreen liberally and repeatedly throughout the day. You should seek shade when your shadow is shorter than you. Protect yourself by wearing long sleeves, pants, a wide-brimmed hat, and sunglasses year round, whenever you are outdoors.  . Once a month, do a whole body skin exam, using a mirror to look at the skin on your back. Tell your health care provider of new moles, moles that have irregular borders, moles that are larger than a pencil eraser, or moles that have changed in shape or color.

## 2018-05-25 NOTE — Progress Notes (Signed)
Subjective:   Hannah Duran is a 84 y.o. female who presents for Medicare Annual (Subsequent) preventive examination.  Review of Systems:  N/A Cardiac Risk Factors include: advanced age (>21men, >74 women);diabetes mellitus;dyslipidemia;hypertension;obesity (BMI >30kg/m2)     Objective:     Vitals: BP 114/78 (BP Location: Left Arm, Patient Position: Sitting, Cuff Size: Normal)   Pulse 73   Temp 98.1 F (36.7 C) (Oral)   Ht 5' 4.5" (1.638 m) Comment: no shoes  Wt 241 lb 8 oz (109.5 kg)   LMP 08/31/1980   SpO2 98%   BMI 40.81 kg/m   Body mass index is 40.81 kg/m.  Advanced Directives 05/25/2018 05/21/2017 12/16/2016 09/09/2016  Does Patient Have a Medical Advance Directive? Yes Yes Yes Yes  Type of Paramedic of [place];Living will [place];Living will [place];Living will [place];Living will  Does patient want to make changes to medical advance directive? - - - No - Patient declined  Copy of [place] in Chart? No - copy requested No - copy requested No - copy requested No - copy requested    Tobacco Social History   Tobacco Use  Smoking Status Never Smoker  Smokeless Tobacco Never Used     Counseling given: No   Clinical Intake:  Pre-visit preparation completed: Yes  Pain : No/denies pain Pain Score: 0-No pain     Nutritional Status: BMI > 30  Obese Nutritional Risks: None Diabetes: Yes CBG done?: No Did pt. bring in CBG monitor from home?: No  How often do you need to have someone help you when you read instructions, pamphlets, or other written materials from your doctor or pharmacy?: 1 - Never What is the last grade level you completed in school?: 12th grade  Interpreter Needed?: No  Comments: pt is a widow and lives alone Information entered by :: LPinson, LPN  Past Medical History:  Diagnosis Date  . Congenital foot deformity   .  Dermatophytosis   . HLD (hyperlipidemia)   . HTN (hypertension)   . Hyperglycemia   . Irregular heart rhythm    pt unsure of what type; followed by PCP only  . Ischemic colitis ([place]) 7/13   on colonoscopy  . Obesity   . Rotator cuff syndrome    Past Surgical History:  Procedure Laterality Date  . CATARACT EXTRACTION W/PHACO Right 09/09/2016   Procedure: CATARACT EXTRACTION PHACO AND INTRAOCULAR LENS PLACEMENT (IOC);  Surgeon: Leandrew Koyanagi, MD;  Location: [facility];  Service: Ophthalmology;  Laterality: Right;  right  . CATARACT EXTRACTION W/PHACO Left 12/16/2016   Procedure: CATARACT EXTRACTION PHACO AND INTRAOCULAR LENS PLACEMENT ([place])  Left;  Surgeon: Leandrew Koyanagi, MD;  Location: [facility];  Service: Ophthalmology;  Laterality: Left;  . HUMERUS FRACTURE SURGERY  10/1997  . TOTAL ABDOMINAL HYSTERECTOMY  1980's   due to menometrorrhagia   Family History  Problem Relation Age of Onset  . Stroke Mother   . Parkinsonism Mother   . Dementia Mother   . Hypertension Mother   . Cancer Father        bladder   Social History   Socioeconomic History  . Marital status: Widowed    Spouse name: Not on file  . Number of children: 2  . Years of education: Not on file  . Highest education level: Not on file  Occupational History  . Occupation: retired Transport planner  . Financial resource strain:  Not on file  . Food insecurity:    Worry: Not on file    Inability: Not on file  . Transportation needs:    Medical: Not on file    Non-medical: Not on file  Tobacco Use  . Smoking status: Never Smoker  . Smokeless tobacco: Never Used  Substance and Sexual Activity  . Alcohol use: No  . Drug use: No  . Sexual activity: Never  Lifestyle  . Physical activity:    Days per week: Not on file    Minutes per session: Not on file  . Stress: Not on file  Relationships  . Social connections:    Talks on phone: Not on file    Gets together: Not on  file    Attends religious service: Not on file    Active member of club or organization: Not on file    Attends meetings of clubs or organizations: Not on file    Relationship status: Not on file  Other Topics Concern  . Not on file  Social History Narrative  . Not on file    Outpatient Encounter Medications as of 05/25/2018  Medication Sig  . atorvastatin (LIPITOR) 10 MG tablet Take 1 tablet (10 mg total) by mouth daily.  . furosemide (LASIX) 20 MG tablet Take 1 tablet (20 mg total) by mouth daily.  . hydrochlorothiazide (MICROZIDE) 12.5 MG capsule Take 1 capsule (12.5 mg total) by mouth daily.  Marland Kitchen losartan (COZAAR) 50 MG tablet Take 1 tablet (50 mg total) by mouth daily.  . metFORMIN (GLUCOPHAGE) 500 MG tablet Take 1 tablet (500 mg total) by mouth 2 (two) times daily with a meal.   No facility-administered encounter medications on file as of 05/25/2018.     Activities of Daily Living In your present state of health, do you have any difficulty performing the following activities: 05/25/2018  Hearing? Y  Vision? N  Difficulty concentrating or making decisions? N  Walking or climbing stairs? N  Dressing or bathing? N  Doing errands, shopping? N  Preparing Food and eating ? N  Using the Toilet? N  In the past six months, have you accidently leaked urine? Y  Do you have problems with loss of bowel control? N  Managing your Medications? N  Managing your Finances? N  Housekeeping or managing your Housekeeping? N  Some recent data might be hidden    Patient Care Team: Tower, Wynelle Fanny, MD as PCP - General Oneta Rack, MD as Consulting Physician (Dermatology)    Assessment:   This is a routine wellness examination for [facility].   Hearing Screening   125Hz  250Hz  500Hz  1000Hz  2000Hz  3000Hz  4000Hz  6000Hz  8000Hz   Right ear:   40 0 40  0    Left ear:   40 0 40  0    Vision Screening Comments: Vision exam in 2019 with Dr. Wallace Going   Exercise Activities and Dietary  recommendations Current Exercise Habits: The patient does not participate in regular exercise at present(walking as tolerated), Exercise limited by: None identified  Goals    . Increase water intake     Starting 05/25/2018, I will attempt to drink at least 6-8 glasses of water daily.        Fall Risk Fall Risk  05/25/2018 05/21/2017 05/14/2016 06/18/2014 06/02/2013  Falls in the past year? No No No No No   Depression Screen PHQ 2/9 Scores 05/25/2018 05/21/2017 05/14/2016 06/18/2014  PHQ - 2 Score 1 0 0 0  PHQ- 9  Score 1 0 - -     Cognitive Function MMSE - Mini Mental State Exam 05/25/2018 05/21/2017 05/14/2016  Orientation to time 5 5 5   Orientation to Place 5 5 5   Registration 3 3 3   Attention/ Calculation 0 0 0  Recall 3 2 3   Recall-comments - pt was unable to recall 1 of 3 words -  Language- name 2 objects 0 0 0  Language- repeat 1 1 1   Language- follow 3 step command 3 3 3   Language- read & follow direction 0 0 0  Write a sentence 0 0 0  Copy design 0 0 0  Total score 20 19 20      PLEASE NOTE: A Mini-Cog screen was completed. Maximum score is 20. A value of 0 denotes this part of [place] MMSE was not completed or the patient failed this part of the Mini-Cog screening.   Mini-Cog Screening Orientation to Time - Max 5 pts Orientation to Place - Max 5 pts Registration - Max 3 pts Recall - Max 3 pts Language Repeat - Max 1 pts Language Follow 3 Step Command - Max 3 pts     Immunization History  Administered Date(s) Administered  . Influenza Split 06/04/2011, 05/18/2012  . Influenza Whole 06/24/2007, 06/05/2008, 06/05/2009, 05/22/2010  . Influenza,inj,Quad PF,6+ Mos 05/17/2013, 05/22/2014, 07/10/2015  . Pneumococcal Conjugate-13 06/18/2014  . Pneumococcal Polysaccharide-23 11/20/2009  . Td 06/22/2001  . Tdap 12/01/2010   Screening Tests Health Maintenance  Topic Date Due  . MAMMOGRAM  08/30/2018 (Originally 07/25/2015)  . INFLUENZA VACCINE  11/30/2018 (Originally  03/31/2018)  . FOOT EXAM  05/28/2018  . OPHTHALMOLOGY EXAM  08/02/2018  . HEMOGLOBIN A1C  11/23/2018  . TETANUS/TDAP  11/30/2020  . DEXA SCAN  Completed  . PNA vac Low Risk Adult  Completed       Plan:     I have personally reviewed, addressed, and noted the following in the patient's chart:  A. Medical and social history B. Use of alcohol, tobacco or illicit drugs  C. Current medications and supplements D. Functional ability and status E.  Nutritional status F.  Physical activity G. Advance directives H. List of other physicians I.  Hospitalizations, surgeries, and ER visits in previous 12 months J.  [place] to include hearing, vision, cognitive, depression L. Referrals and appointments - none  In addition, I have reviewed and discussed with patient certain preventive protocols, quality metrics, and best practice recommendations. A written personalized care plan for preventive services as well as general preventive health recommendations were provided to patient.  See attached scanned questionnaire for additional information.   Signed,   Lindell Noe, MHA, BS, LPN Health Coach

## 2018-05-25 NOTE — Progress Notes (Signed)
PCP notes:   Health maintenance:  A1C -completed  Abnormal screenings:   Hearing - failed  Hearing Screening   125Hz  250Hz  500Hz  1000Hz  2000Hz  3000Hz  4000Hz  6000Hz  8000Hz   Right ear:   40 0 40  0    Left ear:   40 0 40  0     Depression score: 1 Depression screen [facility] 2/9 05/25/2018 05/21/2017 05/14/2016 06/18/2014 06/02/2013  Decreased Interest 0 0 0 0 0  Down, Depressed, Hopeless 1 0 0 0 0  PHQ - 2 Score 1 0 0 0 0  Altered sleeping 0 0 - - -  Tired, decreased energy 0 0 - - -  Change in appetite 0 0 - - -  Feeling bad or failure about yourself  0 0 - - -  Trouble concentrating 0 0 - - -  Moving slowly or fidgety/restless 0 0 - - -  Suicidal thoughts 0 0 - - -  PHQ-9 Score 1 0 - - -  Difficult doing work/chores Not difficult at all Not difficult at all - - -   Patient concerns:   None  Nurse concerns:  None  Next PCP appt:   05/31/2018 @ 0830  I reviewed health advisor's note, was available for consultation, and agree with documentation and plan. Loura Pardon MD

## 2018-05-31 ENCOUNTER — Ambulatory Visit (INDEPENDENT_AMBULATORY_CARE_PROVIDER_SITE_OTHER): Payer: PPO | Admitting: Family Medicine

## 2018-05-31 ENCOUNTER — Encounter: Payer: Self-pay | Admitting: Family Medicine

## 2018-05-31 VITALS — BP 132/78 | HR 68 | Temp 98.2°F | Ht 64.5 in | Wt 246.5 lb

## 2018-05-31 DIAGNOSIS — Z23 Encounter for immunization: Secondary | ICD-10-CM

## 2018-05-31 DIAGNOSIS — M25473 Effusion, unspecified ankle: Secondary | ICD-10-CM | POA: Diagnosis not present

## 2018-05-31 DIAGNOSIS — Z0000 Encounter for general adult medical examination without abnormal findings: Secondary | ICD-10-CM

## 2018-05-31 DIAGNOSIS — E7800 Pure hypercholesterolemia, unspecified: Secondary | ICD-10-CM

## 2018-05-31 DIAGNOSIS — E119 Type 2 diabetes mellitus without complications: Secondary | ICD-10-CM | POA: Diagnosis not present

## 2018-05-31 DIAGNOSIS — I10 Essential (primary) hypertension: Secondary | ICD-10-CM | POA: Diagnosis not present

## 2018-05-31 DIAGNOSIS — Z1231 Encounter for screening mammogram for malignant neoplasm of breast: Secondary | ICD-10-CM | POA: Insufficient documentation

## 2018-05-31 MED ORDER — LOSARTAN POTASSIUM 50 MG PO TABS: 50.0000 mg | ORAL_TABLET | Freq: Every day | ORAL | 3 refills | Status: DC

## 2018-05-31 MED ORDER — HYDROCHLOROTHIAZIDE 12.5 MG PO CAPS: 12.5000 mg | ORAL_CAPSULE | Freq: Every day | ORAL | 3 refills | Status: DC

## 2018-05-31 MED ORDER — ATORVASTATIN CALCIUM 10 MG PO TABS: 10.0000 mg | ORAL_TABLET | Freq: Every day | ORAL | 3 refills | Status: DC

## 2018-05-31 MED ORDER — METFORMIN HCL 500 MG PO TABS: 500.0000 mg | ORAL_TABLET | Freq: Two times a day (BID) | ORAL | 3 refills | Status: DC

## 2018-05-31 MED ORDER — FUROSEMIDE 20 MG PO TABS: 20.0000 mg | ORAL_TABLET | Freq: Every day | ORAL | 3 refills | Status: DC

## 2018-05-31 NOTE — Patient Instructions (Addendum)
Try to get 1200-1500 mg of calcium per day with at least 1000 iu of vitamin D - for bone health   For diabetes and weight loss Try to get most of your carbohydrates from produce (with the exception of white potatoes)  Eat less bread/pasta/rice/snack foods/cereals/sweets and other items from the middle of the grocery store (processed carbs)    For high cholesterol  Avoid red meat/ fried foods/ egg yolks/ fatty breakfast meats/ butter, cheese and high fat dairy/ and shellfish    Stop at check out to schedule your mammogram   Flu shot today

## 2018-05-31 NOTE — Progress Notes (Signed)
Subjective:    Patient ID: Hannah Duran, female    DOB: 1934/01/28, 84 y.o.   MRN: [id number]  HPI Here for health maintenance exam and to review chronic medical problems    Feeling ok overall  Had a good busy summer  Had a small garden over the summer   Wt Readings from Last 3 Encounters:  05/31/18 246 lb 8 oz (111.8 kg)  05/25/18 241 lb 8 oz (109.5 kg)  08/27/17 238 lb 12 oz (108.3 kg)  wt is up 5 lb Trying to take care of herself  She started a walking program / but so exhausted from errands and heat - it is too much  Perhaps when it cools down  Diet- is up and down  41.66 kg/m   Had amw on 9/25 Missed several tones both ears on hearing screen  She notices it occasionally Does not want further eval or hearing aide   Mammogram 11/15-wants [place] to schedule one Self breast exam -no change   Flu shot - wants to get one today   Eye exam 12/18 (has had 2 cataract surgeries)   Zoster status -- not interested   Declines dexa  No falls No fractures recently (humerous fx in 99) Not taking any vit D or calcium   Colonoscopy 5/13 with mucosal ulceration from ischemic colitis  GI did not think she needed a recall  Not interested in further colon cancer screening   bp is stable today  No cp or palpitations or headaches or edema  No side effects to medicines  BP Readings from Last 3 Encounters:  05/31/18 132/78  05/25/18 114/78  08/27/17 128/88    Losartan hctz   DM2 Lab Results  Component Value Date   HGBA1C 6.6 (H) 05/25/2018  she is mindful of sugar in her diet  occ eats some pasta -not often  Prev 6.5 On metformin   Hyperlipidemia Lab Results  Component Value Date   CHOL 209 (H) 05/25/2018   CHOL 150 08/27/2017   CHOL 163 05/21/2017   Lab Results  Component Value Date   HDL 37.80 (L) 05/25/2018   HDL 34.30 (L) 08/27/2017   HDL 32.10 (L) 05/21/2017   Lab Results  Component Value Date   LDLCALC 141 (H) 05/25/2018   LDLCALC 86 08/27/2017   LDLCALC 102 (H) 05/21/2017   Lab Results  Component Value Date   TRIG 150.0 (H) 05/25/2018   TRIG 150.0 (H) 08/27/2017   TRIG 145.0 05/21/2017   Lab Results  Component Value Date   CHOLHDL 6 05/25/2018   CHOLHDL 4 08/27/2017   CHOLHDL 5 05/21/2017   No results found for: LDLDIRECT Atorvastatin and diet  She has not missed any doses  No fried food  No beef  Some sausage -not often  No biscuits  No eggs  Cheese - not often  No other fatty dairy products  ? Lab error   Lab Results  Component Value Date   CREATININE 0.85 05/25/2018   BUN 18 05/25/2018   NA 140 05/25/2018   K 4.1 05/25/2018   CL 103 05/25/2018   CO2 29 05/25/2018   Lab Results  Component Value Date   ALT 15 05/25/2018   AST 18 05/25/2018   ALKPHOS 92 05/25/2018   BILITOT 0.8 05/25/2018    Lab Results  Component Value Date   WBC 8.7 05/25/2018   HGB 12.6 05/25/2018   HCT 38.1 05/25/2018   MCV 87.0 05/25/2018   PLT 325.0 05/25/2018  Lab Results  Component Value Date   TSH 3.63 05/25/2018    Patient Active Problem List   Diagnosis Date Noted  . Screening mammogram, encounter for 05/31/2018  . Encounter for screening mammogram for breast cancer 05/28/2017  . Routine general medical examination at a health care facility 05/19/2016  . Ankle edema 05/01/2015  . Venous (peripheral) insufficiency 05/01/2015  . Cutaneous skin tags 05/23/2014  . Skin lesion of face 05/23/2014  . Fungal nail infection 09/05/2013  . Encounter for Medicare annual wellness exam 06/02/2013  . Internal hemorrhoid 06/21/2012  . Morbid obesity ([place])   . HTN (hypertension)   . HLD (hyperlipidemia)   . Diabetes type 2, controlled ([place])    Past Medical History:  Diagnosis Date  . Congenital foot deformity   . Dermatophytosis   . HLD (hyperlipidemia)   . HTN (hypertension)   . Hyperglycemia   . Irregular heart rhythm    pt unsure of what type; followed by PCP only  . Ischemic colitis ([place]) 7/13   on colonoscopy    . Obesity   . Rotator cuff syndrome    Past Surgical History:  Procedure Laterality Date  . CATARACT EXTRACTION W/PHACO Right 09/09/2016   Procedure: CATARACT EXTRACTION PHACO AND INTRAOCULAR LENS PLACEMENT (IOC);  Surgeon: Leandrew Koyanagi, MD;  Location: [place];  Service: Ophthalmology;  Laterality: Right;  right  . CATARACT EXTRACTION W/PHACO Left 12/16/2016   Procedure: CATARACT EXTRACTION PHACO AND INTRAOCULAR LENS PLACEMENT ([place])  Left;  Surgeon: Leandrew Koyanagi, MD;  Location: [place];  Service: Ophthalmology;  Laterality: Left;  . HUMERUS FRACTURE SURGERY  10/1997  . TOTAL ABDOMINAL HYSTERECTOMY  1980's   due to menometrorrhagia   Social History   Tobacco Use  . Smoking status: Never Smoker  . Smokeless tobacco: Never Used  Substance Use Topics  . Alcohol use: No  . Drug use: No   Family History  Problem Relation Age of Onset  . Stroke Mother   . Parkinsonism Mother   . Dementia Mother   . Hypertension Mother   . Cancer Father        bladder   Allergies  Allergen Reactions  . Penicillins     REACTION: Rash  . Sulfamethoxazole-Trimethoprim     rash   No current outpatient medications on file prior to visit.   No current facility-administered medications on file prior to visit.     Review of Systems  Constitutional: Positive for fatigue. Negative for activity change, appetite change, fever and unexpected weight change.  HENT: Positive for hearing loss. Negative for congestion, ear pain, rhinorrhea, sinus pressure and sore throat.   Eyes: Negative for pain, redness and visual disturbance.  Respiratory: Negative for cough, shortness of breath and wheezing.   Cardiovascular: Negative for chest pain and palpitations.  Gastrointestinal: Negative for abdominal pain, blood in stool, constipation and diarrhea.  Endocrine: Negative for polydipsia and polyuria.  Genitourinary: Negative for dysuria, frequency and urgency.  Musculoskeletal:  Negative for arthralgias, back pain and myalgias.  Skin: Negative for pallor and rash.  Allergic/Immunologic: Negative for environmental allergies.  Neurological: Negative for dizziness, syncope and headaches.  Hematological: Negative for adenopathy. Does not bruise/bleed easily.  Psychiatric/Behavioral: Negative for decreased concentration and dysphoric mood. The patient is not nervous/anxious.        Objective:   Physical Exam  Constitutional: She appears well-developed and well-nourished. No distress.  obese and well appearing   HENT:  Head: Normocephalic and atraumatic.  Right Ear: External  ear normal.  Left Ear: External ear normal.  Mouth/Throat: Oropharynx is clear and moist.  Eyes: Pupils are equal, round, and reactive to light. Conjunctivae and EOM are normal. No scleral icterus.  Neck: Normal range of motion. Neck supple. No JVD present. Carotid bruit is not present. No thyromegaly present.  Cardiovascular: Normal rate, regular rhythm, normal heart sounds and intact distal pulses. Exam reveals no gallop.  Pulmonary/Chest: Effort normal and breath sounds normal. No respiratory distress. She has no wheezes. She has no rales. She exhibits no tenderness. No breast tenderness, discharge or bleeding.  Abdominal: Soft. Bowel sounds are normal. She exhibits no distension, no abdominal bruit and no mass. There is no tenderness.  Genitourinary: No breast tenderness, discharge or bleeding.  Genitourinary Comments: Breast exam: No mass, nodules, thickening, tenderness, bulging, retraction, inflamation, nipple discharge or skin changes noted.  No axillary or clavicular LA.      Musculoskeletal: Normal range of motion. She exhibits no edema or tenderness.  Lymphadenopathy:    She has no cervical adenopathy.  Neurological: She is alert. She has normal reflexes. She displays normal reflexes. No cranial nerve deficit. She exhibits normal muscle tone. Coordination normal.  Skin: Skin is warm and  dry. No rash noted. No erythema. No pallor.  Skin tags on neck SKs diffusely  Solar lentigines diffusely   Psychiatric: She has a normal mood and affect.  Pleasant           Assessment & Plan:   Problem List Items Addressed This Visit      Cardiovascular and Mediastinum   HTN (hypertension)    bp in fair control at this time  BP Readings from Last 1 Encounters:  05/31/18 132/78   No changes needed Most recent labs reviewed  Disc lifstyle change with low sodium diet and exercise  Refill losartan and hctz      Relevant Medications   atorvastatin (LIPITOR) 10 MG tablet   furosemide (LASIX) 20 MG tablet   hydrochlorothiazide (MICROZIDE) 12.5 MG capsule   losartan (COZAAR) 50 MG tablet     Endocrine   Diabetes type 2, controlled ([place])    Lab Results  Component Value Date   HGBA1C 6.6 (H) 05/25/2018   Overall stable Tolerating metformin Disc eye and foot care On arb for renal protection  On statin  Enc wt loss       Relevant Medications   atorvastatin (LIPITOR) 10 MG tablet   losartan (COZAAR) 50 MG tablet   metFORMIN (GLUCOPHAGE) 500 MG tablet     Other   Ankle edema    Takes lasix       HLD (hyperlipidemia)    Disc goals for lipids and reasons to control them Rev last labs with pt Rev low sat fat diet in detail LDL is up significantly and ? Why  No missed statin doses No change in diet  Will re check 6 mo -if no imp inc statin dose       Relevant Medications   atorvastatin (LIPITOR) 10 MG tablet   furosemide (LASIX) 20 MG tablet   hydrochlorothiazide (MICROZIDE) 12.5 MG capsule   losartan (COZAAR) 50 MG tablet   Morbid obesity (H[facility])    Discussed how this problem influences overall health and the risks it imposes  Reviewed plan for weight loss with lower calorie diet (via better food choices and also portion control or program like weight watchers) and exercise building up to or more than 30 minutes 5 days per week including some  aerobic activity     Exercise is challenging with weather and schedule       Relevant Medications   metFORMIN (GLUCOPHAGE) 500 MG tablet   Routine general medical examination at a health care facility - Primary    Reviewed health habits including diet and exercise and skin cancer prevention Reviewed appropriate screening tests for age  Also reviewed health mt list, fam hx and immunization status , as well as social and family history   See HPI amw rev Labs rev  Declines colon screening  Declines dexa  Disc starting ca and D Disc need for wt loss and exercise  Also rev chol- up for ? Reason  Mammogram scheduled  Flu shot today  Declines shingles vaccine       Screening mammogram, encounter for    Scheduled annual screening mammogram Nl breast exam today  Encouraged monthly self exams        Relevant Orders   MM 3D SCREEN BREAST BILATERAL    Other Visit Diagnoses    Need for influenza vaccination       Relevant Orders   Flu Vaccine QUAD 6+ mos PF IM (Fluarix Quad PF) (Completed)

## 2018-05-31 NOTE — Assessment & Plan Note (Signed)
Lab Results  Component Value Date   HGBA1C 6.6 (H) 05/25/2018   Overall stable Tolerating metformin Disc eye and foot care On arb for renal protection  On statin  Enc wt loss

## 2018-05-31 NOTE — Assessment & Plan Note (Signed)
Disc goals for lipids and reasons to control them Rev last labs with pt Rev low sat fat diet in detail LDL is up significantly and ? Why  No missed statin doses No change in diet  Will re check 6 mo -if no imp inc statin dose

## 2018-05-31 NOTE — Assessment & Plan Note (Signed)
Scheduled annual screening mammogram Nl breast exam today  Encouraged monthly self exams   

## 2018-05-31 NOTE — Assessment & Plan Note (Signed)
Reviewed health habits including diet and exercise and skin cancer prevention Reviewed appropriate screening tests for age  Also reviewed health mt list, fam hx and immunization status , as well as social and family history   See HPI amw rev Labs rev  Declines colon screening  Declines dexa  Disc starting ca and D Disc need for wt loss and exercise  Also rev chol- up for ? Reason  Mammogram scheduled  Flu shot today  Declines shingles vaccine

## 2018-05-31 NOTE — Assessment & Plan Note (Signed)
Discussed how this problem influences overall health and the risks it imposes  Reviewed plan for weight loss with lower calorie diet (via better food choices and also portion control or program like weight watchers) and exercise building up to or more than 30 minutes 5 days per week including some aerobic activity   Exercise is challenging with weather and schedule

## 2018-05-31 NOTE — Assessment & Plan Note (Signed)
bp in fair control at this time  BP Readings from Last 1 Encounters:  05/31/18 132/78   No changes needed Most recent labs reviewed  Disc lifstyle change with low sodium diet and exercise  Refill losartan and hctz

## 2018-05-31 NOTE — Assessment & Plan Note (Signed)
Takes lasix

## 2018-06-08 ENCOUNTER — Ambulatory Visit
Admission: RE | Admit: 2018-06-08 | Discharge: 2018-06-08 | Disposition: A | Discharge status: Home / Self Care | Payer: PPO | Source: Ambulatory Visit | Attending: Family Medicine | Admitting: Family Medicine

## 2018-06-08 DIAGNOSIS — Z1231 Encounter for screening mammogram for malignant neoplasm of breast: Secondary | ICD-10-CM | POA: Diagnosis not present

## 2018-07-24 ENCOUNTER — Emergency Department
Admission: EM | Admit: 2018-07-24 | Discharge: 2018-07-25 | Disposition: A | Discharge status: Home / Self Care | Payer: PPO | Attending: Emergency Medicine | Admitting: Emergency Medicine

## 2018-07-24 ENCOUNTER — Other Ambulatory Visit: Payer: Self-pay

## 2018-07-24 ENCOUNTER — Encounter: Payer: Self-pay | Admitting: Emergency Medicine

## 2018-07-24 DIAGNOSIS — E119 Type 2 diabetes mellitus without complications: Secondary | ICD-10-CM | POA: Insufficient documentation

## 2018-07-24 DIAGNOSIS — R4020 Unspecified coma: Secondary | ICD-10-CM | POA: Diagnosis not present

## 2018-07-24 DIAGNOSIS — Z7984 Long term (current) use of oral hypoglycemic drugs: Secondary | ICD-10-CM | POA: Diagnosis not present

## 2018-07-24 DIAGNOSIS — R441 Visual hallucinations: Secondary | ICD-10-CM | POA: Insufficient documentation

## 2018-07-24 DIAGNOSIS — G9341 Metabolic encephalopathy: Secondary | ICD-10-CM

## 2018-07-24 DIAGNOSIS — I10 Essential (primary) hypertension: Secondary | ICD-10-CM | POA: Insufficient documentation

## 2018-07-24 DIAGNOSIS — N3001 Acute cystitis with hematuria: Secondary | ICD-10-CM

## 2018-07-24 DIAGNOSIS — R319 Hematuria, unspecified: Secondary | ICD-10-CM | POA: Diagnosis not present

## 2018-07-24 DIAGNOSIS — R350 Frequency of micturition: Secondary | ICD-10-CM | POA: Diagnosis not present

## 2018-07-24 DIAGNOSIS — Z79899 Other long term (current) drug therapy: Secondary | ICD-10-CM | POA: Diagnosis not present

## 2018-07-24 DIAGNOSIS — R4182 Altered mental status, unspecified: Secondary | ICD-10-CM | POA: Diagnosis present

## 2018-07-24 LAB — CBC WITH DIFFERENTIAL/PLATELET
ABS IMMATURE GRANULOCYTES: 0.11 10*3/uL — AB (ref 0.00–0.07)
BASOS ABS: 0.1 10*3/uL (ref 0.0–0.1)
BASOS PCT: 0 %
EOS ABS: 0.2 10*3/uL (ref 0.0–0.5)
Eosinophils Relative: 1 %
HEMATOCRIT: 40.4 % (ref 36.0–46.0)
Hemoglobin: 13.2 g/dL (ref 12.0–15.0)
IMMATURE GRANULOCYTES: 1 %
LYMPHS ABS: 1.2 10*3/uL (ref 0.7–4.0)
Lymphocytes Relative: 7 %
MCH: 28.8 pg (ref 26.0–34.0)
MCHC: 32.7 g/dL (ref 30.0–36.0)
MCV: 88 fL (ref 80.0–100.0)
MONOS PCT: 5 %
Monocytes Absolute: 0.9 10*3/uL (ref 0.1–1.0)
NEUTROS ABS: 13.7 10*3/uL — AB (ref 1.7–7.7)
NEUTROS PCT: 86 %
NRBC: 0 % (ref 0.0–0.2)
PLATELETS: 361 10*3/uL (ref 150–400)
RBC: 4.59 MIL/uL (ref 3.87–5.11)
RDW: 14.2 % (ref 11.5–15.5)
WBC: 16.1 10*3/uL — ABNORMAL HIGH (ref 4.0–10.5)

## 2018-07-24 LAB — GLUCOSE, CAPILLARY: Glucose-Capillary: 130 mg/dL — ABNORMAL HIGH (ref 70–99)

## 2018-07-24 NOTE — ED Triage Notes (Signed)
Pt reports urinary frequency all day; denies back pain; denies fever; pt has not noticed any blood in urine but son reports seeing what appeared to be blood when he lifted her out of the bathtub tonight; pt was unable to get herself out of the tub; son reports altered mental status-unclear thoughts; visual hallucinations; confused as to date

## 2018-07-24 NOTE — ED Provider Notes (Signed)
[facility] Emergency Department Provider Note  ____________________________________________   First MD Initiated Contact with Patient 07/24/18 2333     (approximate)  I have reviewed the triage vital signs and the nursing notes.   HISTORY  Chief Complaint Urinary Frequency and Altered Mental Status  History is somewhat limited secondary to the patient's confusion  HPI Hannah Duran is a 84 y.o. female is brought to the emergency department by her children over concerns that she is "not acting right" today.  The patient has no complaints herself.  She does report urinary frequency throughout the day but denies dysuria or hematuria.  Her son does report hematuria.  According to the patient's son the patient has been somewhat confused and slow to [facility] and has had some visual hallucinations throughout the day.  Symptoms seem to come on gradually are slowly progressive are now worse but later on it is gotten in the day.  Patient had no fevers or chills.  No chest pain or shortness of breath.  No abdominal pain nausea or vomiting.  No difficulty ambulating.    Past Medical History:  Diagnosis Date  . Congenital foot deformity   . Dermatophytosis   . HLD (hyperlipidemia)   . HTN (hypertension)   . Hyperglycemia   . Irregular heart rhythm    pt unsure of what type; followed by PCP only  . Ischemic colitis ([place]) 7/13   on colonoscopy  . Obesity   . Rotator cuff syndrome     Patient Active Problem List   Diagnosis Date Noted  . Screening mammogram, encounter for 05/31/2018  . Encounter for screening mammogram for breast cancer 05/28/2017  . Routine general medical examination at a health care facility 05/19/2016  . Ankle edema 05/01/2015  . Venous (peripheral) insufficiency 05/01/2015  . Cutaneous skin tags 05/23/2014  . Skin lesion of face 05/23/2014  . Fungal nail infection 09/05/2013  . Encounter for Medicare annual wellness exam 06/02/2013  .  Internal hemorrhoid 06/21/2012  . Morbid obesity ([place])   . HTN (hypertension)   . HLD (hyperlipidemia)   . Diabetes type 2, controlled [facility])     Past Surgical History:  Procedure Laterality Date  . CATARACT EXTRACTION W/PHACO Right 09/09/2016   Procedure: CATARACT EXTRACTION PHACO AND INTRAOCULAR LENS PLACEMENT (IOC);  Surgeon: Leandrew Koyanagi, MD;  Location: [place];  Service: Ophthalmology;  Laterality: Right;  right  . CATARACT EXTRACTION W/PHACO Left 12/16/2016   Procedure: CATARACT EXTRACTION PHACO AND INTRAOCULAR LENS PLACEMENT ([place])  Left;  Surgeon: Leandrew Koyanagi, MD;  Location: [place];  Service: Ophthalmology;  Laterality: Left;  . HUMERUS FRACTURE SURGERY  10/1997  . TOTAL ABDOMINAL HYSTERECTOMY  1980's   due to menometrorrhagia    Prior to Admission medications   Medication Sig Start Date End Date Taking? Authorizing Provider  atorvastatin (LIPITOR) 10 MG tablet Take 1 tablet (10 mg total) by mouth daily. 05/31/18   Tower, Wynelle Fanny, MD  cephALEXin (KEFLEX) 500 MG capsule Take 1 capsule (500 mg total) by mouth 3 (three) times daily for 5 days. 07/25/18 07/30/18  Darel Hong, MD  furosemide (LASIX) 20 MG tablet Take 1 tablet (20 mg total) by mouth daily. 05/31/18   Tower, Wynelle Fanny, MD  hydrochlorothiazide (MICROZIDE) 12.5 MG capsule Take 1 capsule (12.5 mg total) by mouth daily. 05/31/18   Tower, Wynelle Fanny, MD  losartan (COZAAR) 50 MG tablet Take 1 tablet (50 mg total) by mouth daily. 05/31/18   Tower, [place]  A, MD  metFORMIN (GLUCOPHAGE) 500 MG tablet Take 1 tablet (500 mg total) by mouth 2 (two) times daily with a meal. 05/31/18   Tower, Wynelle Fanny, MD    Allergies Penicillins and Sulfamethoxazole-trimethoprim  Family History  Problem Relation Age of Onset  . Stroke Mother   . Parkinsonism Mother   . Dementia Mother   . Hypertension Mother   . Cancer Father        bladder  . Breast cancer Neg Hx     Social History Social History   Tobacco  Use  . Smoking status: Never Smoker  . Smokeless tobacco: Never Used  Substance Use Topics  . Alcohol use: No  . Drug use: No    Review of Systems Constitutional: No fever/chills Eyes: No visual changes. ENT: No sore throat. Cardiovascular: Denies chest pain. Respiratory: Denies shortness of breath. Gastrointestinal: No abdominal pain.  No nausea, no vomiting.  No diarrhea.  No constipation. Genitourinary: Positive for urinary frequency Musculoskeletal: Negative for back pain. Skin: Negative for rash. Neurological: Negative for headaches, focal weakness or numbness.   ____________________________________________   PHYSICAL EXAM:  VITAL SIGNS: ED Triage Vitals  Enc Vitals Group     BP 07/24/18 2306 (!) 158/85     Pulse Rate 07/24/18 2306 97     Resp 07/24/18 2306 20     Temp 07/24/18 2306 98.1 F (36.7 C)     Temp Source 07/24/18 2306 Oral     SpO2 07/24/18 2306 98 %     Weight 07/24/18 2306 230 lb (104.3 kg)     Height --      Head Circumference --      Peak Flow --      Pain Score 07/24/18 2317 0     Pain Loc --      Pain Edu? --      Excl. in [place]? --     Constitutional: Alert and oriented x4 although clearly slow mentation.  Pleasantly confused.  Nontoxic Eyes: PERRL EOMI. Head: Atraumatic. Nose: No congestion/rhinnorhea. Mouth/Throat: No trismus Neck: No stridor.   Cardiovascular: Normal rate, regular rhythm. Grossly normal heart sounds.  Good peripheral circulation. Respiratory: Normal respiratory effort.  No retractions. Lungs CTAB and moving good air Gastrointestinal: Soft nondistended nontender no rebound or guarding no peritonitis no McBurney's tenderness negative Rovsing's Musculoskeletal: No lower extremity edema   Neurologic:  Normal speech and language. No gross focal neurologic deficits are appreciated. Skin:  Skin is warm, dry and intact. No rash noted. Psychiatric: Mood and affect are normal. Speech and behavior are  normal.    ____________________________________________   DIFFERENTIAL includes but not limited to  Urinary tract infection, toxic encephalopathy, dehydration, intracerebral hemorrhage, stroke ____________________________________________   LABS (all labs ordered are listed, but only abnormal results are displayed)  Labs Reviewed  GLUCOSE, CAPILLARY - Abnormal; Notable for the following components:      Result Value   Glucose-Capillary 130 (*)    All other components within normal limits  URINALYSIS, COMPLETE (UACMP) WITH MICROSCOPIC - Abnormal; Notable for the following components:   Color, Urine YELLOW (*)    APPearance CLEAR (*)    Hgb urine dipstick MODERATE (*)    Ketones, ur 20 (*)    Nitrite POSITIVE (*)    Bacteria, UA RARE (*)    All other components within normal limits  COMPREHENSIVE METABOLIC PANEL - Abnormal; Notable for the following components:   Glucose, Bld 143 (*)    Total Bilirubin 1.5 (*)  All other components within normal limits  CBC WITH DIFFERENTIAL/PLATELET - Abnormal; Notable for the following components:   WBC 16.1 (*)    Neutro Abs 13.7 (*)    Abs Immature Granulocytes 0.11 (*)    All other components within normal limits  URINE CULTURE  TROPONIN I    Lab work reviewed by me consistent with urinary tract infection.  Elevated white count consistent with acute bacterial infection __________________________________________  EKG  ED ECG REPORT I, Darel Hong, the attending physician, personally viewed and interpreted this ECG.  Date: 07/25/2018 EKG Time:  Rate: 88 Rhythm: normal sinus rhythm QRS Axis: normal Intervals: normal ST/T Wave abnormalities: normal Narrative Interpretation: no evidence of acute ischemia  ____________________________________________  RADIOLOGY  CT scan of the head reviewed by me with chronic changes but no acute disease noted ____________________________________________   PROCEDURES  Procedure(s)  performed: no  Procedures  Critical Care performed: no  ____________________________________________   INITIAL IMPRESSION / ASSESSMENT AND PLAN / ED COURSE  Pertinent labs & imaging results that were available during my care of the patient were reviewed by me and considered in my medical decision making (see chart for details).   As part of my medical decision making, I reviewed the following data within the [place] History obtained from family if available, nursing notes, old chart and ekg, as well as notes from prior ED visits.  Patient comes to the emergency department alert and oriented x4 although not at her baseline according to family and clearly somewhat can.  Given her age and unclear history lab work including in and out urinalysis and head CT obtained.  Fortunately her head CT is reassuring but her urinalysis is consistent with infection.  She has no history of resistant bacteria on chart review.  Given a dose of ceftriaxone here.  I appreciate her "allergy" to penicillin however third-generation cephalosporins have essentially no crossover.  I will then discharge her with a short course of cephalexin and primary care follow-up in 2 days.      ____________________________________________   FINAL CLINICAL IMPRESSION(S) / ED DIAGNOSES  Final diagnoses:  Acute cystitis with hematuria  Acute metabolic encephalopathy      NEW MEDICATIONS STARTED DURING THIS VISIT:  New Prescriptions   CEPHALEXIN (KEFLEX) 500 MG CAPSULE    Take 1 capsule (500 mg total) by mouth 3 (three) times daily for 5 days.     Note:  This document was prepared using Dragon voice recognition software and may include unintentional dictation errors.     Darel Hong, MD 07/25/18 [phone]

## 2018-07-25 ENCOUNTER — Emergency Department: Payer: PPO

## 2018-07-25 DIAGNOSIS — R4020 Unspecified coma: Secondary | ICD-10-CM | POA: Diagnosis not present

## 2018-07-25 LAB — URINALYSIS, COMPLETE (UACMP) WITH MICROSCOPIC
Bilirubin Urine: NEGATIVE
GLUCOSE, UA: NEGATIVE mg/dL
Ketones, ur: 20 mg/dL — AB
Leukocytes, UA: NEGATIVE
Nitrite: POSITIVE — AB
PH: 5 (ref 5.0–8.0)
Protein, ur: NEGATIVE mg/dL
Specific Gravity, Urine: 1.025 (ref 1.005–1.030)

## 2018-07-25 LAB — COMPREHENSIVE METABOLIC PANEL
ALBUMIN: 4.4 g/dL (ref 3.5–5.0)
ALK PHOS: 86 U/L (ref 38–126)
ALT: 20 U/L (ref 0–44)
AST: 31 U/L (ref 15–41)
Anion gap: 12 (ref 5–15)
BILIRUBIN TOTAL: 1.5 mg/dL — AB (ref 0.3–1.2)
BUN: 20 mg/dL (ref 8–23)
CALCIUM: 9.2 mg/dL (ref 8.9–10.3)
CO2: 24 mmol/L (ref 22–32)
Chloride: 103 mmol/L (ref 98–111)
Creatinine, Ser: 0.8 mg/dL (ref 0.44–1.00)
GFR calc Af Amer: >60 mL/min (ref >60)
GFR calc non Af Amer: >60 mL/min (ref >60)
Glucose, Bld: 143 mg/dL — ABNORMAL HIGH (ref 70–99)
POTASSIUM: 3.7 mmol/L (ref 3.5–5.1)
SODIUM: 139 mmol/L (ref 135–145)
TOTAL PROTEIN: 7.5 g/dL (ref 6.5–8.1)

## 2018-07-25 LAB — TROPONIN I: Troponin I: <0.03 ng/mL (ref <0.03)

## 2018-07-25 MED ORDER — SODIUM CHLORIDE 0.9 % IV SOLN
1.0000 g | Freq: Once | INTRAVENOUS | Status: AC
  Administered 2018-07-25: 1 g via INTRAVENOUS
  Filled 2018-07-25: qty 10

## 2018-07-25 MED ORDER — CEPHALEXIN 500 MG PO CAPS: 500.0000 mg | ORAL_CAPSULE | Freq: Three times a day (TID) | ORAL | 0 refills | Status: AC

## 2018-07-25 NOTE — ED Notes (Signed)
Pt sipping on po fluids.  

## 2018-07-25 NOTE — Discharge Instructions (Addendum)
Today your urinalysis showed you have an infection which is the cause of your confusion.  Fortunately your CT scan was very reassuring.  Please take all of your antibiotics as prescribed and follow up with your PMD in 2 days for a recheck.  Return to the ED sooner for any concerns whatsoever.  It was a pleasure to take care of you today, and thank you for coming to our emergency department.  If you have any questions or concerns before leaving please ask the nurse to grab me and I'm more than happy to go through your aftercare instructions again.  If you were prescribed any opioid pain medication today such as Norco, Vicodin, Percocet, morphine, hydrocodone, or oxycodone please make sure you do not drive when you are taking this medication as it can alter your ability to drive safely.  If you have any concerns once you are home that you are not improving or are in fact getting worse before you can make it to your follow-up appointment, please do not hesitate to call 911 and come back for further evaluation.  Darel Hong, MD  Results for orders placed or performed during the hospital encounter of 07/24/18  Glucose, capillary  Result Value Ref Range   Glucose-Capillary 130 (H) 70 - 99 mg/dL  Urinalysis, Complete w Microscopic  Result Value Ref Range   Color, Urine YELLOW (A) YELLOW   APPearance CLEAR (A) CLEAR   Specific Gravity, Urine 1.025 1.005 - 1.030   pH 5.0 5.0 - 8.0   Glucose, UA NEGATIVE NEGATIVE mg/dL   Hgb urine dipstick MODERATE (A) NEGATIVE   Bilirubin Urine NEGATIVE NEGATIVE   Ketones, ur 20 (A) NEGATIVE mg/dL   Protein, ur NEGATIVE NEGATIVE mg/dL   Nitrite POSITIVE (A) NEGATIVE   Leukocytes, UA NEGATIVE NEGATIVE   Squamous Epithelial / LPF 0-5 0 - 5   WBC, UA 0-5 0 - 5 WBC/hpf   RBC / HPF 0-5 0 - 5 RBC/hpf   Bacteria, UA RARE (A) NONE SEEN   Mucus PRESENT   Troponin I - Once  Result Value Ref Range   Troponin I <0.03 <0.03 ng/mL  Comprehensive metabolic panel    Result Value Ref Range   Sodium 139 135 - 145 mmol/L   Potassium 3.7 3.5 - 5.1 mmol/L   Chloride 103 98 - 111 mmol/L   CO2 24 22 - 32 mmol/L   Glucose, Bld 143 (H) 70 - 99 mg/dL   BUN 20 8 - 23 mg/dL   Creatinine, Ser 0.80 0.44 - 1.00 mg/dL   Calcium 9.2 8.9 - 10.3 mg/dL   Total Protein 7.5 6.5 - 8.1 g/dL   Albumin 4.4 3.5 - 5.0 g/dL   AST 31 15 - 41 U/L   ALT 20 0 - 44 U/L   Alkaline Phosphatase 86 38 - 126 U/L   Total Bilirubin 1.5 (H) 0.3 - 1.2 mg/dL   GFR calc non Af Amer >60 >60 mL/min   GFR calc Af Amer >60 >60 mL/min   Anion gap 12 5 - 15  CBC with Differential  Result Value Ref Range   WBC 16.1 (H) 4.0 - 10.5 K/uL   RBC 4.59 3.87 - 5.11 MIL/uL   Hemoglobin 13.2 12.0 - 15.0 g/dL   HCT 40.4 36.0 - 46.0 %   MCV 88.0 80.0 - 100.0 fL   MCH 28.8 26.0 - 34.0 pg   MCHC 32.7 30.0 - 36.0 g/dL   RDW 14.2 11.5 - 15.5 %  Platelets 361 150 - 400 K/uL   nRBC 0.0 0.0 - 0.2 %   Neutrophils Relative % 86 %   Neutro Abs 13.7 (H) 1.7 - 7.7 K/uL   Lymphocytes Relative 7 %   Lymphs Abs 1.2 0.7 - 4.0 K/uL   Monocytes Relative 5 %   Monocytes Absolute 0.9 0.1 - 1.0 K/uL   Eosinophils Relative 1 %   Eosinophils Absolute 0.2 0.0 - 0.5 K/uL   Basophils Relative 0 %   Basophils Absolute 0.1 0.0 - 0.1 K/uL   Immature Granulocytes 1 %   Abs Immature Granulocytes 0.11 (H) 0.00 - 0.07 K/uL   Ct Head Wo Contrast  Result Date: 07/25/2018 CLINICAL DATA:  Altered level of consciousness (LOC), unexplained EXAM: CT HEAD WITHOUT CONTRAST TECHNIQUE: Contiguous axial images were obtained from the base of the skull through the vertex without intravenous contrast. COMPARISON:  None. FINDINGS: Brain: Moderate degree of atrophy. Mild chronic small vessel ischemia. No intracranial hemorrhage, mass effect, or midline shift. No hydrocephalus. The basilar cisterns are patent. No evidence of territorial infarct or acute ischemia. No extra-axial or intracranial fluid collection. Vascular: Atherosclerosis of  skullbase vasculature without hyperdense vessel or abnormal calcification. Skull: No fracture or focal lesion. Sinuses/Orbits: Paranasal sinuses and mastoid air cells are clear. The visualized orbits are unremarkable. Other: None. IMPRESSION: 1.  No acute intracranial abnormality. 2. Moderate atrophy with mild chronic small vessel ischemia. Electronically Signed   By: Keith Rake M.D.   On: 07/25/2018 00:52

## 2018-07-25 NOTE — ED Notes (Signed)
Patient transported to CT 

## 2018-07-27 ENCOUNTER — Ambulatory Visit: Payer: PPO | Admitting: Family Medicine

## 2018-07-28 LAB — URINE CULTURE: Culture: >=100000 — AB

## 2018-08-01 ENCOUNTER — Encounter: Payer: Self-pay | Admitting: Family Medicine

## 2018-08-01 ENCOUNTER — Ambulatory Visit (INDEPENDENT_AMBULATORY_CARE_PROVIDER_SITE_OTHER): Payer: PPO | Admitting: Family Medicine

## 2018-08-01 VITALS — BP 120/58 | HR 66 | Temp 97.9°F | Ht 64.5 in | Wt 243.2 lb

## 2018-08-01 DIAGNOSIS — N390 Urinary tract infection, site not specified: Secondary | ICD-10-CM | POA: Insufficient documentation

## 2018-08-01 DIAGNOSIS — N3001 Acute cystitis with hematuria: Secondary | ICD-10-CM | POA: Diagnosis not present

## 2018-08-01 DIAGNOSIS — R8290 Unspecified abnormal findings in urine: Secondary | ICD-10-CM | POA: Diagnosis not present

## 2018-08-01 LAB — POC URINALSYSI DIPSTICK (AUTOMATED)
BILIRUBIN UA: NEGATIVE
Blood, UA: NEGATIVE
GLUCOSE UA: NEGATIVE
KETONES UA: NEGATIVE
Nitrite, UA: NEGATIVE
Protein, UA: NEGATIVE
SPEC GRAV UA: 1.025 (ref 1.010–1.025)
Urobilinogen, UA: 0.2 E.U./dL
pH, UA: 6 (ref 5.0–8.0)

## 2018-08-01 NOTE — Progress Notes (Signed)
Subjective:    Patient ID: Hannah Duran, female    DOB: 02/05/1934, 84 y.o.   MRN: [id number]  HPI Here for f/u of ED visit 11/24 for UTI  She presented with change in MS and hematuria  Had some visual hallucinations and confusion  UA was pos for blood and nitrite and wbc and ketones   Lab Results  Component Value Date   WBC 16.1 (H) 07/24/2018   HGB 13.2 07/24/2018   HCT 40.4 07/24/2018   MCV 88.0 07/24/2018   PLT 361 07/24/2018   Lab Results  Component Value Date   CREATININE 0.80 07/24/2018   BUN 20 07/24/2018   NA 139 07/24/2018   K 3.7 07/24/2018   CL 103 07/24/2018   CO2 24 07/24/2018   Did CT head in light of MS change Ct Head Wo Contrast  Result Date: 07/25/2018 CLINICAL DATA:  Altered level of consciousness (LOC), unexplained EXAM: CT HEAD WITHOUT CONTRAST TECHNIQUE: Contiguous axial images were obtained from the base of the skull through the vertex without intravenous contrast. COMPARISON:  None. FINDINGS: Brain: Moderate degree of atrophy. Mild chronic small vessel ischemia. No intracranial hemorrhage, mass effect, or midline shift. No hydrocephalus. The basilar cisterns are patent. No evidence of territorial infarct or acute ischemia. No extra-axial or intracranial fluid collection. Vascular: Atherosclerosis of skullbase vasculature without hyperdense vessel or abnormal calcification. Skull: No fracture or focal lesion. Sinuses/Orbits: Paranasal sinuses and mastoid air cells are clear. The visualized orbits are unremarkable. Other: None. IMPRESSION: 1.  No acute intracranial abnormality. 2. Moderate atrophy with mild chronic small vessel ischemia. Electronically Signed   By: Keith Rake M.D.   On: 07/25/2018 00:52   Re assuring    Given a dose of ceftriaxone in ED and fluid  Px cephalexin   Wt Readings from Last 3 Encounters:  08/01/18 243 lb 4 oz (110.3 kg)  07/24/18 230 lb (104.3 kg)  05/31/18 246 lb 8 oz (111.8 kg)   41.11 kg/m   BP Readings  from Last 3 Encounters:  08/01/18 (!) 120/58  07/25/18 (!) 146/76  05/31/18 132/78   Pulse Readings from Last 3 Encounters:  08/01/18 66  07/25/18 85  05/31/18 68   Urine cx are out pan sensitive e coli   She plans to go out of town in a week and wants to make sure uti is better     Patient Active Problem List   Diagnosis Date Noted  . UTI (urinary tract infection) 08/01/2018  . Screening mammogram, encounter for 05/31/2018  . Encounter for screening mammogram for breast cancer 05/28/2017  . Routine general medical examination at a health care facility 05/19/2016  . Ankle edema 05/01/2015  . Venous (peripheral) insufficiency 05/01/2015  . Cutaneous skin tags 05/23/2014  . Skin lesion of face 05/23/2014  . Fungal nail infection 09/05/2013  . Encounter for Medicare annual wellness exam 06/02/2013  . Internal hemorrhoid 06/21/2012  . Morbid obesity ([place])   . HTN (hypertension)   . HLD (hyperlipidemia)   . Diabetes type 2, controlled ([place])    Past Medical History:  Diagnosis Date  . Congenital foot deformity   . Dermatophytosis   . HLD (hyperlipidemia)   . HTN (hypertension)   . Hyperglycemia   . Irregular heart rhythm    pt unsure of what type; followed by PCP only  . Ischemic colitis ([place]) 7/13   on colonoscopy  . Obesity   . Rotator cuff syndrome    Past Surgical History:  Procedure Laterality Date  . CATARACT EXTRACTION W/PHACO Right 09/09/2016   Procedure: CATARACT EXTRACTION PHACO AND INTRAOCULAR LENS PLACEMENT (I[facility]);  Surgeon: Leandrew Koyanagi, MD;  Location: [facility];  Service: Ophthalmology;  Laterality: Right;  right  . CATARACT EXTRACTION W/PHACO Left 12/16/2016   Procedure: CATARACT EXTRACTION PHACO AND INTRAOCULAR LENS PLACEMENT ([place])  Left;  Surgeon: Leandrew Koyanagi, MD;  Location: [facility];  Service: Ophthalmology;  Laterality: Left;  . HUMERUS FRACTURE SURGERY  10/1997  . TOTAL ABDOMINAL HYSTERECTOMY  1980's   due to  menometrorrhagia   Social History   Tobacco Use  . Smoking status: Never Smoker  . Smokeless tobacco: Never Used  Substance Use Topics  . Alcohol use: No  . Drug use: No   Family History  Problem Relation Age of Onset  . Stroke Mother   . Parkinsonism Mother   . Dementia Mother   . Hypertension Mother   . Cancer Father        bladder  . Breast cancer Neg Hx    Allergies  Allergen Reactions  . Penicillins     REACTION: Rash  . Sulfamethoxazole-Trimethoprim     rash   Current Outpatient Medications on File Prior to Visit  Medication Sig Dispense Refill  . atorvastatin (LIPITOR) 10 MG tablet Take 1 tablet (10 mg total) by mouth daily. 90 tablet 3  . furosemide (LASIX) 20 MG tablet Take 1 tablet (20 mg total) by mouth daily. 90 tablet 3  . hydrochlorothiazide (MICROZIDE) 12.5 MG capsule Take 1 capsule (12.5 mg total) by mouth daily. 90 capsule 3  . losartan (COZAAR) 50 MG tablet Take 1 tablet (50 mg total) by mouth daily. 90 tablet 3  . metFORMIN (GLUCOPHAGE) 500 MG tablet Take 1 tablet (500 mg total) by mouth 2 (two) times daily with a meal. 180 tablet 3   No current facility-administered medications on file prior to visit.      Review of Systems  Constitutional: Positive for fatigue. Negative for activity change, appetite change, fever and unexpected weight change.  HENT: Negative for congestion, ear pain, rhinorrhea, sinus pressure and sore throat.   Eyes: Negative for pain, redness and visual disturbance.  Respiratory: Negative for cough, shortness of breath and wheezing.   Cardiovascular: Negative for chest pain and palpitations.  Gastrointestinal: Negative for abdominal pain, blood in stool, constipation and diarrhea.  Endocrine: Negative for polydipsia and polyuria.  Genitourinary: Positive for frequency. Negative for decreased urine volume, dysuria, flank pain, hematuria, urgency and vaginal discharge.  Musculoskeletal: Negative for arthralgias, back pain and  myalgias.  Skin: Negative for pallor and rash.  Allergic/Immunologic: Negative for environmental allergies.  Neurological: Negative for dizziness, syncope and headaches.  Hematological: Negative for adenopathy. Does not bruise/bleed easily.  Psychiatric/Behavioral: Negative for decreased concentration and dysphoric mood. The patient is not nervous/anxious.        Objective:   Physical Exam  Constitutional: She appears well-developed and well-nourished. No distress.  obese and well appearing   HENT:  Head: Normocephalic and atraumatic.  Eyes: Pupils are equal, round, and reactive to light. Conjunctivae and EOM are normal.  Neck: Normal range of motion. Neck supple.  Cardiovascular: Normal rate, regular rhythm and normal heart sounds.  Pulmonary/Chest: Effort normal and breath sounds normal.  Abdominal: Soft. Bowel sounds are normal. She exhibits no distension. There is no tenderness. There is no rebound.  No cva tenderness  No suprapubic tenderness  Musculoskeletal: She exhibits no edema.  Lymphadenopathy:    She has  no cervical adenopathy.  Neurological: She is alert. She displays normal reflexes. Coordination normal.  Skin: No rash noted.  Psychiatric: She has a normal mood and affect. Cognition and memory are normal.  Pleasant   Nl MS  Mentally sharp  Good mood           Assessment & Plan:   Problem List Items Addressed This Visit      Genitourinary   UTI (urinary tract infection) - Primary    F/u from ED visit 11/24 for uti with MS change and hematuria  Reviewed hospital records, lab results and studies in detail   Much clinical improvement- just still tired Enc fluids and rest with gradual inc in activity  Tolerated cephalosporin   Re check urine today (pt cannot urinate here and will return with sample today) inst to call or f/u if any symptoms return       Relevant Orders   POCT Urinalysis Dipstick (Automated) (Completed)   Urine Culture    Other  Visit Diagnoses    Abnormal urinalysis       Relevant Orders   Urine Culture

## 2018-08-01 NOTE — Patient Instructions (Signed)
Drink lots of fluids  Urinate regularly even when traveling   Get some rest   We will re check urine today (to make sure uti is gone)  It will take a while longer to energy back -but let me know if fever or nausea or if any confusion

## 2018-08-01 NOTE — Assessment & Plan Note (Addendum)
F/u from ED visit 11/24 for uti with MS change and hematuria  Reviewed hospital records, lab results and studies in detail   Much clinical improvement- just still tired Enc fluids and rest with gradual inc in activity  Tolerated cephalosporin   Re check urine today (pt cannot urinate here and will return with sample today) inst to call or f/u if any symptoms return

## 2018-08-02 ENCOUNTER — Telehealth: Payer: Self-pay | Admitting: *Deleted

## 2018-08-02 LAB — URINE CULTURE
MICRO NUMBER:: 91439702
RESULT: NO GROWTH
SPECIMEN QUALITY:: ADEQUATE

## 2018-08-02 NOTE — Telephone Encounter (Signed)
Left VM requesting pt to call the office back regarding UA results

## 2018-08-04 NOTE — Telephone Encounter (Signed)
Addressed in result notes  

## 2018-08-13 DIAGNOSIS — J209 Acute bronchitis, unspecified: Secondary | ICD-10-CM | POA: Diagnosis not present

## 2018-08-13 DIAGNOSIS — R062 Wheezing: Secondary | ICD-10-CM | POA: Diagnosis not present

## 2018-08-13 DIAGNOSIS — J40 Bronchitis, not specified as acute or chronic: Secondary | ICD-10-CM | POA: Diagnosis not present

## 2018-08-15 ENCOUNTER — Telehealth: Payer: Self-pay

## 2018-08-15 NOTE — Telephone Encounter (Signed)
[facility] faxed note pt has a cold with cough and wants med. Unable to reach pt by phone for update and appt.FYI to [place] CMA.

## 2018-08-15 NOTE — Telephone Encounter (Signed)
Triage note in your inbox for review since Rena couldn't reach pt

## 2018-08-15 NOTE — Telephone Encounter (Signed)
Please schedule appt when you can reach her Thanks  I rev the triage note

## 2018-10-26 DIAGNOSIS — B351 Tinea unguium: Secondary | ICD-10-CM | POA: Diagnosis not present

## 2018-10-26 DIAGNOSIS — M79675 Pain in left toe(s): Secondary | ICD-10-CM | POA: Diagnosis not present

## 2018-10-26 DIAGNOSIS — M79674 Pain in right toe(s): Secondary | ICD-10-CM | POA: Diagnosis not present

## 2019-01-05 ENCOUNTER — Encounter: Payer: Self-pay | Admitting: Family Medicine

## 2019-01-05 ENCOUNTER — Other Ambulatory Visit (INDEPENDENT_AMBULATORY_CARE_PROVIDER_SITE_OTHER): Payer: PPO

## 2019-01-05 ENCOUNTER — Telehealth: Payer: Self-pay | Admitting: *Deleted

## 2019-01-05 ENCOUNTER — Ambulatory Visit (INDEPENDENT_AMBULATORY_CARE_PROVIDER_SITE_OTHER): Payer: PPO | Admitting: Family Medicine

## 2019-01-05 DIAGNOSIS — I10 Essential (primary) hypertension: Secondary | ICD-10-CM

## 2019-01-05 DIAGNOSIS — R4182 Altered mental status, unspecified: Secondary | ICD-10-CM

## 2019-01-05 DIAGNOSIS — E7800 Pure hypercholesterolemia, unspecified: Secondary | ICD-10-CM | POA: Diagnosis not present

## 2019-01-05 DIAGNOSIS — R441 Visual hallucinations: Secondary | ICD-10-CM | POA: Diagnosis not present

## 2019-01-05 DIAGNOSIS — E119 Type 2 diabetes mellitus without complications: Secondary | ICD-10-CM

## 2019-01-05 DIAGNOSIS — Z87440 Personal history of urinary (tract) infections: Secondary | ICD-10-CM

## 2019-01-05 LAB — CBC WITH DIFFERENTIAL/PLATELET
Basophils Absolute: 0.1 10*3/uL (ref 0.0–0.1)
Basophils Relative: 1.1 % (ref 0.0–3.0)
Eosinophils Absolute: 0.2 10*3/uL (ref 0.0–0.7)
Eosinophils Relative: 2.2 % (ref 0.0–5.0)
HCT: 35.9 % — ABNORMAL LOW (ref 36.0–46.0)
Hemoglobin: 11.4 g/dL — ABNORMAL LOW (ref 12.0–15.0)
Lymphocytes Relative: 16.2 % (ref 12.0–46.0)
Lymphs Abs: 1.2 10*3/uL (ref 0.7–4.0)
MCHC: 31.8 g/dL (ref 30.0–36.0)
MCV: 81.9 fl (ref 78.0–100.0)
Monocytes Absolute: 0.6 10*3/uL (ref 0.1–1.0)
Monocytes Relative: 7.8 % (ref 3.0–12.0)
Neutro Abs: 5.4 10*3/uL (ref 1.4–7.7)
Neutrophils Relative %: 72.7 % (ref 43.0–77.0)
Platelets: 334 10*3/uL (ref 150.0–400.0)
RBC: 4.38 Mil/uL (ref 3.87–5.11)
RDW: 16.4 % — ABNORMAL HIGH (ref 11.5–15.5)
WBC: 7.4 10*3/uL (ref 4.0–10.5)

## 2019-01-05 LAB — POCT URINALYSIS DIPSTICK
Bilirubin, UA: NEGATIVE
Blood, UA: NEGATIVE
Glucose, UA: NEGATIVE
Ketones, UA: NEGATIVE
Nitrite, UA: NEGATIVE
Protein, UA: NEGATIVE
Spec Grav, UA: >=1.03 — AB (ref 1.010–1.025)
Urobilinogen, UA: 0.2 E.U./dL
pH, UA: 6 (ref 5.0–8.0)

## 2019-01-05 LAB — COMPREHENSIVE METABOLIC PANEL
ALT: 16 U/L (ref 0–35)
AST: 20 U/L (ref 0–37)
Albumin: 3.8 g/dL (ref 3.5–5.2)
Alkaline Phosphatase: 95 U/L (ref 39–117)
BUN: 17 mg/dL (ref 6–23)
CO2: 28 mEq/L (ref 19–32)
Calcium: 8.8 mg/dL (ref 8.4–10.5)
Chloride: 105 mEq/L (ref 96–112)
Creatinine, Ser: 0.8 mg/dL (ref 0.40–1.20)
GFR: 68.55 mL/min (ref >60.00)
Glucose, Bld: 129 mg/dL — ABNORMAL HIGH (ref 70–99)
Potassium: 3.8 mEq/L (ref 3.5–5.1)
Sodium: 140 mEq/L (ref 135–145)
Total Bilirubin: 0.6 mg/dL (ref 0.2–1.2)
Total Protein: 6.2 g/dL (ref 6.0–8.3)

## 2019-01-05 LAB — LIPID PANEL
Cholesterol: 196 mg/dL (ref 0–200)
HDL: 39.2 mg/dL (ref >39.00)
LDL Cholesterol: 125 mg/dL — ABNORMAL HIGH (ref 0–99)
NonHDL: 156.77
Total CHOL/HDL Ratio: 5
Triglycerides: 161 mg/dL — ABNORMAL HIGH (ref 0.0–149.0)
VLDL: 32.2 mg/dL (ref 0.0–40.0)

## 2019-01-05 LAB — TSH: TSH: 2.49 u[IU]/mL (ref 0.35–4.50)

## 2019-01-05 LAB — VITAMIN B12: Vitamin B-12: 138 pg/mL — ABNORMAL LOW (ref 211–911)

## 2019-01-05 LAB — HEMOGLOBIN A1C: Hgb A1c MFr Bld: 6.4 % (ref 4.6–6.5)

## 2019-01-05 LAB — SEDIMENTATION RATE: Sed Rate: 19 mm/hr (ref 0–30)

## 2019-01-05 MED ORDER — CEPHALEXIN 500 MG PO CAPS: 500.0000 mg | ORAL_CAPSULE | Freq: Two times a day (BID) | ORAL | 0 refills | Status: DC

## 2019-01-05 NOTE — Assessment & Plan Note (Signed)
Labs planned Diet not as good

## 2019-01-05 NOTE — Assessment & Plan Note (Signed)
Diet /exercise have not been optimal during quarantine -pt admits to that Enc her to work on healthier habits for wt loss

## 2019-01-05 NOTE — Telephone Encounter (Signed)
-----   Message from Abner Greenspan, MD sent at 01/05/2019 12:38 PM EDT ----- She may have a uti  Also urine is cloudy and concentrated  Despite pcn allergy she did well with last uti tx with keflex Please send keflex 500 mg 1 po bid #14 no ref to her pref pharmacy  Pend cx -will re assess as well  Please make effort to drink more water as well

## 2019-01-05 NOTE — Assessment & Plan Note (Signed)
Of her deceased husband and people coming to drive him places  Non audio  She sometimes believes them and other times does not  Both night and day and in or out of house  Want to first r/o organic cause  Ua/cx and labs planned  Will watch closely for physical symptoms  Son (to become DPR) is watching closely (no safety concerns thus far) Disc anxiety/worries during pandemic as well

## 2019-01-05 NOTE — Assessment & Plan Note (Signed)
Recently has visual hallucinations of her deceased husband and people coming to take him somewhere  Day and night/ both at home and out of the house  Sometimes she believes in them and thinks he is still alive, other times not (she is aware she is hallucinating)  Spoke to son as well (there with her)- will get DPR so I can speak to him in the future  Will first try to r/o organic cause with ua/ucx and labs  Disc poss of manifestation of anxiety or some dementia as well

## 2019-01-05 NOTE — Patient Instructions (Signed)
Let's get labs on you (you are also due for regular follow up labs for diabetes and hypertension)  Also let's check urine tests to see if you have an infection  If you hallucinations worsen or you become confused please do contact me and go to the ER if needed  Also watch for headache/fever or any stroke symptoms  Let family or [place] know if you feel anxious or down  Let's see how results look and make a plan from there

## 2019-01-05 NOTE — Assessment & Plan Note (Signed)
Pt does not suspect any recent bp changes Labs planned

## 2019-01-05 NOTE — Assessment & Plan Note (Addendum)
In setting of MS changes/hallucinations  Will check ua and culture

## 2019-01-05 NOTE — Progress Notes (Signed)
Virtual Visit via Telephone Note  I connected with Hannah Duran on 01/05/19 at 10:00 AM EDT by telephone and verified that I am speaking with the correct person using two identifiers.  Location: Patient: home Provider: office    I discussed the limitations, risks, security and privacy concerns of performing an evaluation and management service by telephone and the availability of in person appointments. I also discussed with the patient that there may be a patient responsible charge related to this service. The patient expressed understanding and agreed to proceed.   History of Present Illness: Pt presents with request for testing   Pt has been seeing her husband "deceased" - hallucinations  Her son/family is interested in having her evaluated   This began in January seeing "visions)  Was checked out -had a uti and was treated   Gets up at night and goes looking for him through the house  Then in feb- she would see him at different times of the day Visual only- and saw people "picking him up"  Did not hear anything at all   Yesterday she said someone came to pick up her husband  She contacted the [facility] police and sheriff's dept  Saw the same people at [facility] yesterday Frightened by it and anxious  Close friends and family are concerned about it  She does not recognize the people   Sometimes she realizes that this is a hallucination and at other times she does not  Sometimes she forgets he is deceased and at others does not   Sometimes smells "filth"  No threats   No speech at all   Does report some vivid dreams  Does not site any touch   Feels physically ok  No urinary symptoms  No fever  No cough or sob  No aches or pains that are new  Does not have a known covid exposure   She has stayed home during pandemic She picks up carry out /does not cook  Tries to eat healthy food  Gets outdoors when she can and enjoys it   She sleeps well overall  Active during  the day  Appetite is the same   She has drank more sodas  No excessive urination or thirst  Lab Results  Component Value Date   HGBA1C 6.6 (H) 05/25/2018    No headaches She has occasional cramps in her legs (perhaps needs to drink more water)  No n/t or weakness No fatigue more than usual  No slurring   occ misplaces things   Review of Systems  Constitutional: Negative for chills, diaphoresis, fever, malaise/fatigue and weight loss.  HENT: Negative for sore throat.   Eyes: Negative for blurred vision.  Respiratory: Negative for cough and shortness of breath.   Cardiovascular: Negative for chest pain, palpitations and leg swelling.  Gastrointestinal: Negative for abdominal pain, diarrhea, nausea and vomiting.  Genitourinary: Negative for dysuria and hematuria.  Musculoskeletal: Negative for myalgias.  Skin: Negative for itching and rash.  Neurological: Negative for dizziness, tremors, sensory change, speech change, focal weakness, loss of consciousness and headaches.  Psychiatric/Behavioral: Positive for hallucinations.       Unsure if more anxious -perhaps with the pandemic    Patient Active Problem List   Diagnosis Date Noted  . Change in mental status 01/05/2019  . Visual hallucinations 01/05/2019  . History of recurrent UTIs 01/05/2019  . UTI (urinary tract infection) 08/01/2018  . Screening mammogram, encounter for 05/31/2018  . Encounter for screening mammogram  for breast cancer 05/28/2017  . Routine general medical examination at a health care facility 05/19/2016  . Ankle edema 05/01/2015  . Venous (peripheral) insufficiency 05/01/2015  . Cutaneous skin tags 05/23/2014  . Fungal nail infection 09/05/2013  . Encounter for Medicare annual wellness exam 06/02/2013  . Internal hemorrhoid 06/21/2012  . Morbid obesity ([place])   . HTN (hypertension)   . HLD (hyperlipidemia)   . Diabetes type 2, controlled ([place])    Past Medical History:  Diagnosis Date  .  Congenital foot deformity   . Dermatophytosis   . HLD (hyperlipidemia)   . HTN (hypertension)   . Hyperglycemia   . Irregular heart rhythm    pt unsure of what type; followed by PCP only  . Ischemic colitis ([place]) 7/13   on colonoscopy  . Obesity   . Rotator cuff syndrome    Past Surgical History:  Procedure Laterality Date  . CATARACT EXTRACTION W/PHACO Right 09/09/2016   Procedure: CATARACT EXTRACTION PHACO AND INTRAOCULAR LENS PLACEMENT (IOC);  Surgeon: Leandrew Koyanagi, MD;  Location: [facility];  Service: Ophthalmology;  Laterality: Right;  right  . CATARACT EXTRACTION W/PHACO Left 12/16/2016   Procedure: CATARACT EXTRACTION PHACO AND INTRAOCULAR LENS PLACEMENT ([place])  Left;  Surgeon: Leandrew Koyanagi, MD;  Location: [facility];  Service: Ophthalmology;  Laterality: Left;  . HUMERUS FRACTURE SURGERY  10/1997  . TOTAL ABDOMINAL HYSTERECTOMY  1980's   due to menometrorrhagia   Social History   Tobacco Use  . Smoking status: Never Smoker  . Smokeless tobacco: Never Used  Substance Use Topics  . Alcohol use: No  . Drug use: No   Family History  Problem Relation Age of Onset  . Stroke Mother   . Parkinsonism Mother   . Dementia Mother   . Hypertension Mother   . Cancer Father        bladder  . Breast cancer Neg Hx    Allergies  Allergen Reactions  . Penicillins     REACTION: Rash  . Sulfamethoxazole-Trimethoprim     rash   Current Outpatient Medications on File Prior to Visit  Medication Sig Dispense Refill  . atorvastatin (LIPITOR) 10 MG tablet Take 1 tablet (10 mg total) by mouth daily. 90 tablet 3  . furosemide (LASIX) 20 MG tablet Take 1 tablet (20 mg total) by mouth daily. 90 tablet 3  . hydrochlorothiazide (MICROZIDE) 12.5 MG capsule Take 1 capsule (12.5 mg total) by mouth daily. 90 capsule 3  . losartan (COZAAR) 50 MG tablet Take 1 tablet (50 mg total) by mouth daily. 90 tablet 3  . metFORMIN (GLUCOPHAGE) 500 MG tablet Take 1 tablet (500  mg total) by mouth 2 (two) times daily with a meal. 180 tablet 3   No current facility-administered medications on file prior to visit.     Observations/Objective: Patient sounds like her normal self/ mentally sharp Today she seems to believe her hallucination (tells me what her husband did today) No slurred speech and not hoarse  No sob or cough with speech  Pt does not sound distressed  Not depressed or suicidal and not tearful  Mildly anxoius  Spoke with son who is helpful as well    Assessment and Plan: Problem List Items Addressed This Visit      Cardiovascular and Mediastinum   HTN (hypertension)    Pt does not suspect any recent bp changes Labs planned      Relevant Orders   CBC with Differential/Platelet   Comprehensive metabolic  panel   Lipid panel   TSH     Endocrine   Diabetes type 2, controlled ([place])    Due for A1C Drinking more sodas and eating some carry out (not cooking)  Will plan labs      Relevant Orders   Hemoglobin A1c     Other   Morbid obesity ([place])    Diet /exercise have not been optimal during quarantine -pt admits to that Enc her to work on healthier habits for wt loss      HLD (hyperlipidemia)    Labs planned Diet not as good       Relevant Orders   Lipid panel   Change in mental status    Recently has visual hallucinations of her deceased husband and people coming to take him somewhere  Day and night/ both at home and out of the house  Sometimes she believes in them and thinks he is still alive, other times not (she is aware she is hallucinating)  Spoke to son as well (there with her)- will get DPR so I can speak to him in the future  Will first try to r/o organic cause with ua/ucx and labs  Disc poss of manifestation of anxiety or some dementia as well      Relevant Orders   Urine Culture   CBC with Differential/Platelet   Comprehensive metabolic panel   TSH   Vitamin B12   Sedimentation rate   Visual hallucinations -  Primary    Of her deceased husband and people coming to drive him places  Non audio  She sometimes believes them and other times does not  Both night and day and in or out of house  Want to first r/o organic cause  Ua/cx and labs planned  Will watch closely for physical symptoms  Son (to become DPR) is watching closely (no safety concerns thus far) Disc anxiety/worries during pandemic as well        History of recurrent UTIs    In setting of MS changes/hallucinations  Will check ua and culture      Relevant Orders   Urine Culture       Follow Up Instructions: Let's get labs on you (you are also due for regular follow up labs for diabetes and hypertension)  Also let's check urine tests to see if you have an infection  If you hallucinations worsen or you become confused please do contact me and go to the ER if needed  Also watch for headache/fever or any stroke symptoms  Let family or [place] know if you feel anxious or down  Let's see how results look and make a plan from there    I discussed the assessment and treatment plan with the patient. The patient was provided an opportunity to ask questions and all were answered. The patient agreed with the plan and demonstrated an understanding of the instructions.   The patient was advised to call back or seek an in-person evaluation if the symptoms worsen or if the condition fails to improve as anticipated.  I provided 30 minutes of non-face-to-face time during this encounter.   Loura Pardon, MD

## 2019-01-05 NOTE — Telephone Encounter (Signed)
Pt notified of UA results and Dr. Marliss Coots comments and Rx sent to pharmacy

## 2019-01-05 NOTE — Assessment & Plan Note (Signed)
Due for A1C Drinking more sodas and eating some carry out (not cooking)  Will plan labs

## 2019-01-07 LAB — URINE CULTURE
MICRO NUMBER:: 454135
SPECIMEN QUALITY:: ADEQUATE

## 2019-01-09 ENCOUNTER — Other Ambulatory Visit: Payer: Self-pay

## 2019-01-09 ENCOUNTER — Ambulatory Visit (INDEPENDENT_AMBULATORY_CARE_PROVIDER_SITE_OTHER): Payer: PPO | Admitting: *Deleted

## 2019-01-09 DIAGNOSIS — E538 Deficiency of other specified B group vitamins: Secondary | ICD-10-CM | POA: Diagnosis not present

## 2019-01-09 MED ORDER — CYANOCOBALAMIN 1000 MCG/ML IJ SOLN
1000.0000 ug | Freq: Once | INTRAMUSCULAR | Status: AC
  Administered 2019-01-09: 1000 ug via INTRAMUSCULAR

## 2019-01-09 NOTE — Progress Notes (Signed)
Per orders of Dr. Tower, injection of B12 given by Velma Hanna M. °Patient tolerated injection well.  °

## 2019-01-16 ENCOUNTER — Ambulatory Visit (INDEPENDENT_AMBULATORY_CARE_PROVIDER_SITE_OTHER): Payer: PPO | Admitting: *Deleted

## 2019-01-16 ENCOUNTER — Telehealth: Payer: Self-pay | Admitting: *Deleted

## 2019-01-16 DIAGNOSIS — E538 Deficiency of other specified B group vitamins: Secondary | ICD-10-CM

## 2019-01-16 MED ORDER — CYANOCOBALAMIN 1000 MCG/ML IJ SOLN
1000.0000 ug | Freq: Once | INTRAMUSCULAR | Status: AC
  Administered 2019-01-16: 1000 ug via INTRAMUSCULAR

## 2019-01-16 NOTE — Telephone Encounter (Signed)
Pt said all of her UTI sxs have resolved and she just wanted to double check, pt will bring urine sample back with her next week when she comes for her B12 inj

## 2019-01-16 NOTE — Progress Notes (Signed)
Per orders of Dr. Tower, injection of B12 given by [place], [place]ale M. °Patient tolerated injection well.  °

## 2019-01-16 NOTE — Telephone Encounter (Signed)
Pt was seen today for her weekly B12 inj. Pt asked me to ask Dr. Glori Bickers a question. Pt said she recently had a UTI and wanted to see if Dr. Glori Bickers wanted her to bring a urine sample back to have her urine rechecked. Pt was given UA supplies and said if Dr. Glori Bickers is okay with it she will bring in a sample next week when she comes for her weekly B12 inj. Is it okay to get urine sample from pt, please advise

## 2019-01-16 NOTE — Telephone Encounter (Signed)
That is fine  Please ask her if symptoms are gone also  Thanks

## 2019-01-24 ENCOUNTER — Ambulatory Visit (INDEPENDENT_AMBULATORY_CARE_PROVIDER_SITE_OTHER): Payer: PPO

## 2019-01-24 DIAGNOSIS — R8290 Unspecified abnormal findings in urine: Secondary | ICD-10-CM | POA: Diagnosis not present

## 2019-01-24 DIAGNOSIS — Z87440 Personal history of urinary (tract) infections: Secondary | ICD-10-CM | POA: Diagnosis not present

## 2019-01-24 DIAGNOSIS — E538 Deficiency of other specified B group vitamins: Secondary | ICD-10-CM | POA: Diagnosis not present

## 2019-01-24 LAB — POC URINALSYSI DIPSTICK (AUTOMATED)
Bilirubin, UA: NEGATIVE
Blood, UA: NEGATIVE
Glucose, UA: NEGATIVE
Nitrite, UA: POSITIVE
Protein, UA: NEGATIVE
Spec Grav, UA: 1.025 (ref 1.010–1.025)
Urobilinogen, UA: 0.2 E.U./dL
pH, UA: 6 (ref 5.0–8.0)

## 2019-01-24 MED ORDER — CYANOCOBALAMIN 1000 MCG/ML IJ SOLN
1000.0000 ug | Freq: Once | INTRAMUSCULAR | Status: AC
  Administered 2019-01-24: 1000 ug via INTRAMUSCULAR

## 2019-01-24 NOTE — Progress Notes (Signed)
Per orders of Dr. Tower, injection of Vitamin B12 given by Amanda C Wagoner,RN.  Administered to left deltoid IM.  Patient tolerated injection well.  

## 2019-01-26 ENCOUNTER — Other Ambulatory Visit: Payer: Self-pay | Admitting: Family Medicine

## 2019-01-26 LAB — URINE CULTURE
MICRO NUMBER:: 505399
SPECIMEN QUALITY:: ADEQUATE

## 2019-01-26 MED ORDER — CIPROFLOXACIN HCL 250 MG PO TABS: 250.0000 mg | ORAL_TABLET | Freq: Two times a day (BID) | ORAL | 0 refills | Status: DC

## 2019-01-26 NOTE — Telephone Encounter (Signed)
Left VM requesting pt to call the office back 

## 2019-01-26 NOTE — Telephone Encounter (Signed)
Spoke with pt and advised her of urine cx results and Dr. Marliss Coots comments. Rx sent to pharmacy. When I asked pt how she's doing and give me an update she said "i'm fine that's it". And didn't want to go into details but pt's son was there and heard me ask her so he got on the phone and advised me that pt is still confused and she is still hallucinating.   I advised son to have mother take abx and update date [place] after she finishes to see if that helps, I advised him since pt signed her DPR that he can call directly and update [place] when she is done with abx on how she's doing he verbalized understanding an said he will call us directly with an update

## 2019-01-26 NOTE — Telephone Encounter (Signed)
Urine is still pos for e coli infection after keflex tx  She is allergic to sulfa   I pended a short course of cipro to send to [place] (3 days)  If she has side effects with this (muscle pain or confusion) please let us know   I know she was not having any urinary symptoms to begin with-but she did have some mental status changes (hallucinations)- please ask if she is still having these  I would like her to also update [place] after finishing the abx

## 2019-01-30 ENCOUNTER — Ambulatory Visit (INDEPENDENT_AMBULATORY_CARE_PROVIDER_SITE_OTHER): Payer: PPO | Admitting: *Deleted

## 2019-01-30 DIAGNOSIS — E538 Deficiency of other specified B group vitamins: Secondary | ICD-10-CM | POA: Diagnosis not present

## 2019-01-30 MED ORDER — CYANOCOBALAMIN 1000 MCG/ML IJ SOLN
1000.0000 ug | Freq: Once | INTRAMUSCULAR | Status: AC
  Administered 2019-01-30: 1000 ug via INTRAMUSCULAR

## 2019-01-30 NOTE — Progress Notes (Signed)
Per orders of Dr. Tower, injection of B12 given by Merci Walthers M. °Patient tolerated injection well.  °

## 2019-01-31 ENCOUNTER — Ambulatory Visit: Payer: PPO

## 2019-01-31 ENCOUNTER — Telehealth: Payer: Self-pay | Admitting: Family Medicine

## 2019-01-31 NOTE — Telephone Encounter (Signed)
Best number [phone]  Leavy Cella  (son) called to let you know how pt is doing.  He stated her hallucinations are worse  More frequent both inside/ outside of house    She is seeing things outside  of house  Hearing noises when they are not there.  She is seeing her husband who passed away several years ago.  Not sleeping well at all

## 2019-01-31 NOTE — Telephone Encounter (Signed)
I asked pt how she was doing yesterday when she came in for her B12 inj, and she told me she was fine and everything was better, please see pt's son's comment below

## 2019-02-01 NOTE — Telephone Encounter (Signed)
I spoke to her son about her symptoms.  I am worried about early dementia (particularly lewy body type) that can cause hallucinations  Interestingly she is not having memory problems at this time Reviewed labs  I would ultimately like to get her to neurology or neuropsychiatry if possible  Her son agrees-he will speak to her and see if she will be open to this and call back and let me know

## 2019-02-13 ENCOUNTER — Telehealth: Payer: Self-pay | Admitting: Family Medicine

## 2019-02-13 DIAGNOSIS — R441 Visual hallucinations: Secondary | ICD-10-CM

## 2019-02-13 DIAGNOSIS — R4182 Altered mental status, unspecified: Secondary | ICD-10-CM

## 2019-02-13 NOTE — Telephone Encounter (Signed)
I spoke to her son and pt is agreeable to see neurology for her hallucinations/ confusion  to see if this may be dementia  I will put the referral in  Please call pt's son (not the patient herself) -he is organizing this and has a DPR  I did do some labs and treat her for uti  Getting B12 shots No improvement in her symptoms

## 2019-02-13 NOTE — Telephone Encounter (Signed)
Best number [phone] Hannah Duran called wanting to leave information Pt is ready to go to referral

## 2019-02-13 NOTE — Telephone Encounter (Signed)
See 01/31/19 phone note: pt's son was talking to Dr. Glori Bickers, would like a call back from Dr. Glori Bickers to discuss pt's status and discuss referral

## 2019-02-14 NOTE — Telephone Encounter (Signed)
Spoke with patients son Ron. Sent Referral over to [place], changed home phone number to sons phone number.

## 2019-03-08 ENCOUNTER — Other Ambulatory Visit: Payer: Self-pay

## 2019-03-08 ENCOUNTER — Ambulatory Visit: Payer: PPO | Admitting: Neurology

## 2019-03-08 ENCOUNTER — Encounter: Payer: Self-pay | Admitting: Neurology

## 2019-03-08 VITALS — BP 153/70 | HR 78 | Temp 97.5°F | Ht 64.5 in | Wt 244.0 lb

## 2019-03-08 DIAGNOSIS — R441 Visual hallucinations: Secondary | ICD-10-CM

## 2019-03-08 MED ORDER — QUETIAPINE FUMARATE 25 MG PO TABS: 50.0000 mg | ORAL_TABLET | Freq: Every day | ORAL | 11 refills | Status: DC

## 2019-03-08 NOTE — Progress Notes (Signed)
PATIENT: Hannah Duran DOB: March 05, 1934  Chief Complaint  Patient presents with  . Altered Mental Status    MMSE 28/30 - 6 animals.  She is here with her son, Ron, for an evaluation of hallucinations, anxiety and mild memory loss.  Marland Kitchen PCP    Tower, Wynelle Fanny, MD     HISTORICAL  Katilynn Sinkler Char is a 85 year old female, seen in request by her primary care physician Dr.Tower for evaluation of visual hallucinations she is accompanied by her son Ron at today's clinic visit on March 08, 2019.   I have reviewed and summarized the referring note from the referring physician.  She had a past medical history of hypertension, hyperlipidemia, diabetes, presented with gradual worsening visual hallucinations since January 2020.  She is a retired Network engineer, she lives alone since her husband passed away in Oct 08, 2014, she has lived in current house for 15 years, she was not able to give me a timeline of the history, she thought she only lived at her current house for 2 years.  Since January 2020, she was noted to have intermittent visual hallucinations, mostly at the nighttime, gradually getting worse, now she has visual hallucinations day and night, most of the hallucinations are focusing on her husband, she thought her disease to her husband came back to the home, was with a group of friends that she does not like him to be with, never changes his T-shirt, this bothers her a lot, sometimes she is very angry, has called her neighbors, 911 few times,  During the conversation, she was able to tell me the date her husband passed away in 10/08/14, but she still think her husband coming back to the house every day,   She also has gradual onset memory loss, making mistakes in her check balance, needing help from her son,  Her mother suffered dementia, maternal grandmother also suffered dementia Personally reviewed CT head without contrast November 2019: No acute abnormality, moderate atrophy, mild  small vessel disease. Laboratory evaluation in May 2020: Vitamin B12 138, she is on vitamin B12 supplements now Normal ESR, TSH, A1c, CMP showed elevated glucose 129, CBC showed hemoglobin of 11.4  REVIEW OF SYSTEMS: Full 14 system review of systems performed and notable only for anxiety, not enough sleep, hallucinations, confusion, sleepiness, cramps, swelling in legs All other review of systems were negative.  ALLERGIES: Allergies  Allergen Reactions  . Penicillins     REACTION: Rash  . Sulfamethoxazole-Trimethoprim     rash  . Amoxicillin Rash    HOME MEDICATIONS: Current Outpatient Medications  Medication Sig Dispense Refill  . atorvastatin (LIPITOR) 10 MG tablet Take 1 tablet (10 mg total) by mouth daily. 90 tablet 3  . cyanocobalamin (,VITAMIN B-12,) 1000 MCG/ML injection Inject 1,000 mcg into the muscle every 30 (thirty) days. Inject weekly X 4 weeks 5/11 - 01/31/19, then begin monthly injections.    . furosemide (LASIX) 20 MG tablet Take 1 tablet (20 mg total) by mouth daily. 90 tablet 3  . hydrochlorothiazide (MICROZIDE) 12.5 MG capsule Take 1 capsule (12.5 mg total) by mouth daily. 90 capsule 3  . losartan (COZAAR) 50 MG tablet Take 1 tablet (50 mg total) by mouth daily. 90 tablet 3  . metFORMIN (GLUCOPHAGE) 500 MG tablet Take 1 tablet (500 mg total) by mouth 2 (two) times daily with a meal. 180 tablet 3   No current facility-administered medications for this visit.     PAST MEDICAL HISTORY: Past Medical History:  Diagnosis Date  . Anxiety   . Congenital foot deformity   . Dermatophytosis   . Hallucinations   . HLD (hyperlipidemia)   . HTN (hypertension)   . Hyperglycemia   . Irregular heart rhythm    pt unsure of what type; followed by PCP only  . Ischemic colitis ([place]) 7/13   on colonoscopy  . Memory loss   . Obesity   . Rotator cuff syndrome     PAST SURGICAL HISTORY: Past Surgical History:  Procedure Laterality Date  . CATARACT EXTRACTION W/PHACO Right  09/09/2016   Procedure: CATARACT EXTRACTION PHACO AND INTRAOCULAR LENS PLACEMENT (IOC);  Surgeon: Leandrew Koyanagi, MD;  Location: [facility];  Service: Ophthalmology;  Laterality: Right;  right  . CATARACT EXTRACTION W/PHACO Left 12/16/2016   Procedure: CATARACT EXTRACTION PHACO AND INTRAOCULAR LENS PLACEMENT ([place])  Left;  Surgeon: Leandrew Koyanagi, MD;  Location: [facility];  Service: Ophthalmology;  Laterality: Left;  . HUMERUS FRACTURE SURGERY  10/1997  . TOTAL ABDOMINAL HYSTERECTOMY  1980's   due to menometrorrhagia    FAMILY HISTORY: Family History  Problem Relation Age of Onset  . Stroke Mother   . Parkinsonism Mother   . Hypertension Mother   . Alzheimer's disease Mother   . Cancer Father        bladder/prostate  . Breast cancer Neg Hx     SOCIAL HISTORY: Social History   Socioeconomic History  . Marital status: Widowed    Spouse name: Not on file  . Number of children: 2  . Years of education: 5  . Highest education level: High school graduate  Occupational History  . Occupation: retired Transport planner  . Financial resource strain: Not on file  . Food insecurity    Worry: Not on file    Inability: Not on file  . Transportation needs    Medical: Not on file    Non-medical: Not on file  Tobacco Use  . Smoking status: Never Smoker  . Smokeless tobacco: Never Used  Substance and Sexual Activity  . Alcohol use: No  . Drug use: No  . Sexual activity: Never  Lifestyle  . Physical activity    Days per week: Not on file    Minutes per session: Not on file  . Stress: Not on file  Relationships  . Social Herbalist on phone: Not on file    Gets together: Not on file    Attends religious service: Not on file    Active member of club or organization: Not on file    Attends meetings of clubs or organizations: Not on file    Relationship status: Not on file  . Intimate partner violence    Fear of current or ex partner:  Not on file    Emotionally abused: Not on file    Physically abused: Not on file    Forced sexual activity: Not on file  Other Topics Concern  . Not on file  Social History Narrative   Lives at home alone.   Right-handed.   One cup coffee per day.     PHYSICAL EXAM   Vitals:   03/08/19 1311  BP: (!) 153/70  Pulse: 78  Temp: (!) 97.5 F (36.4 C)  Weight: 244 lb (110.7 kg)  Height: 5' 4.5" (1.638 m)    Not recorded      Body mass index is 41.24 kg/m.  PHYSICAL EXAMNIATION:  Gen: NAD, conversant, well nourised, obese, well  groomed                     Cardiovascular: Regular rate rhythm, no peripheral edema, warm, nontender. Eyes: Conjunctivae clear without exudates or hemorrhage Neck: Supple, no carotid bruits. Pulmonary: Clear to auscultation bilaterally   NEUROLOGICAL EXAM:  MMSE - Mini Mental State Exam 03/08/2019 05/25/2018 05/21/2017  Orientation to time '5 5 5  '$ Orientation to Place '5 5 5  '$ Registration '3 3 3  '$ Attention/ Calculation 5 0 0  Recall '1 3 2  '$ Recall-comments - - pt was unable to recall 1 of 3 words  Language- name 2 objects 2 0 0  Language- repeat '1 1 1  '$ Language- follow 3 step command '3 3 3  '$ Language- read & follow direction 1 0 0  Write a sentence 1 0 0  Copy design 1 0 0  Total score '28 20 19  '$ animal naming 6.   CRANIAL NERVES: CN II: Visual fields are full to confrontation.  Pupils are round equal and briskly reactive to light. CN III, IV, VI: extraocular movement are normal. No ptosis. CN V: Facial sensation is intact to pinprick in all 3 divisions bilaterally. Corneal responses are intact.  CN VII: Face is symmetric with normal eye closure and smile. CN VIII: Hearing is normal to rubbing fingers CN IX, X: Palate elevates symmetrically. Phonation is normal. CN XI: Head turning and shoulder shrug are intact CN XII: Tongue is midline with normal movements and no atrophy.  MOTOR: There is no pronator drift of out-stretched arms. Muscle bulk  and tone are normal. Muscle strength is normal.  REFLEXES: Reflexes are 2+ and symmetric at the biceps, triceps, knees, and ankles. Plantar responses are flexor.  SENSORY: Intact to light touch, pinprick, positional sensation and vibratory sensation are intact in fingers and toes.  COORDINATION: Rapid alternating movements and fine finger movements are intact. There is no dysmetria on finger-to-nose and heel-knee-shin.    GAIT/STANCE: She needs to push up to get up from seated position, steady   DIAGNOSTIC DATA (LABS, IMAGING, TESTING) - I reviewed patient records, labs, notes, testing and imaging myself where available.   ASSESSMENT AND PLAN  Shameria Trimarco is a 85 y.o. female   Visual hallucinations Memory loss  Consistent with central nervous system degenerative disorders, possible Lewy body dementia  There is no significant parkinsonian features on today's examination  Proceed with MRI of the brain  Add on Seroquel 25 titrating to 50 mg every night  Vitamin B12 deficiency  Continue Vitamin B12 supplement  Marcial Pacas, M.D. Ph.D.  [facility] [address], [place], [place] [address] Ph: [phone] Fax: [phone]  CC: Referring Provider

## 2019-03-09 ENCOUNTER — Telehealth: Payer: Self-pay | Admitting: Neurology

## 2019-03-09 NOTE — Telephone Encounter (Signed)
health team order sent to GI. No auth they will reach out to the patient to schedule.  

## 2019-03-16 ENCOUNTER — Ambulatory Visit
Admission: RE | Admit: 2019-03-16 | Discharge: 2019-03-16 | Disposition: A | Discharge status: Home / Self Care | Payer: PPO | Source: Ambulatory Visit | Attending: Neurology | Admitting: Neurology

## 2019-03-16 DIAGNOSIS — R441 Visual hallucinations: Secondary | ICD-10-CM | POA: Diagnosis not present

## 2019-03-20 ENCOUNTER — Telehealth: Payer: Self-pay | Admitting: *Deleted

## 2019-03-20 NOTE — Telephone Encounter (Signed)
-----   Message from Marcial Pacas, MD sent at 03/20/2019 12:51 PM EDT ----- Please call patient: MRI of the brain showed mild age-related changes, there was no acute abnormality, I will review films with her at next follow-up visit.

## 2019-03-20 NOTE — Telephone Encounter (Signed)
Spoke to her son, Hannah Duran (on [place]).  He is aware of her MRI results and verbalized understanding.  She is still experiencing some visual hallucinations (ex: seeing her husband who passed away four years ago).  He has started her on the prescribed Seroquel 25mg , one tablet at bedtime but has not tried two tablets yet, as directed.  He will attempted the higher dosage.  He will call back if it is unhelpful.

## 2019-03-29 ENCOUNTER — Other Ambulatory Visit (INDEPENDENT_AMBULATORY_CARE_PROVIDER_SITE_OTHER): Payer: PPO

## 2019-03-29 ENCOUNTER — Encounter: Payer: Self-pay | Admitting: Family Medicine

## 2019-03-29 ENCOUNTER — Other Ambulatory Visit: Payer: PPO

## 2019-03-29 ENCOUNTER — Ambulatory Visit (INDEPENDENT_AMBULATORY_CARE_PROVIDER_SITE_OTHER): Payer: PPO

## 2019-03-29 ENCOUNTER — Other Ambulatory Visit: Payer: Self-pay

## 2019-03-29 DIAGNOSIS — E538 Deficiency of other specified B group vitamins: Secondary | ICD-10-CM | POA: Diagnosis not present

## 2019-03-29 LAB — VITAMIN B12: Vitamin B-12: 224 pg/mL (ref 211–911)

## 2019-03-29 MED ORDER — CYANOCOBALAMIN 1000 MCG/ML IJ SOLN
1000.0000 ug | Freq: Once | INTRAMUSCULAR | Status: AC
  Administered 2019-03-29: 1000 ug via INTRAMUSCULAR

## 2019-03-29 NOTE — Progress Notes (Signed)
Per orders of Dr. Glori Bickers, injection of B12 given by Kris Mouton. Patient tolerated injection well.  Did lab work first. Scheduled to have monthly B12 at this time unless changes are made after labs reviewed.

## 2019-04-19 ENCOUNTER — Other Ambulatory Visit: Payer: Self-pay

## 2019-04-19 ENCOUNTER — Ambulatory Visit (INDEPENDENT_AMBULATORY_CARE_PROVIDER_SITE_OTHER): Payer: PPO

## 2019-04-19 DIAGNOSIS — E538 Deficiency of other specified B group vitamins: Secondary | ICD-10-CM

## 2019-04-19 MED ORDER — CYANOCOBALAMIN 1000 MCG/ML IJ SOLN
1000.0000 ug | Freq: Once | INTRAMUSCULAR | Status: AC
  Administered 2019-04-19: 1000 ug via INTRAMUSCULAR

## 2019-04-19 NOTE — Progress Notes (Signed)
Per orders of Dr. Glori Bickers injection of B12 injection-gets it every 3 weeks x 3 months given by Kris Mouton. Patient tolerated injection well.

## 2019-05-02 ENCOUNTER — Ambulatory Visit: Payer: PPO

## 2019-05-25 ENCOUNTER — Other Ambulatory Visit: Payer: Self-pay

## 2019-05-25 ENCOUNTER — Ambulatory Visit (INDEPENDENT_AMBULATORY_CARE_PROVIDER_SITE_OTHER): Payer: PPO | Admitting: Neurology

## 2019-05-25 ENCOUNTER — Encounter: Payer: Self-pay | Admitting: Neurology

## 2019-05-25 VITALS — BP 142/67 | HR 62 | Temp 98.1°F | Ht 64.5 in | Wt 251.5 lb

## 2019-05-25 DIAGNOSIS — R441 Visual hallucinations: Secondary | ICD-10-CM | POA: Diagnosis not present

## 2019-05-25 MED ORDER — ALPRAZOLAM 0.5 MG PO TABS: 0.5000 mg | ORAL_TABLET | Freq: Every evening | ORAL | 5 refills | Status: DC | PRN

## 2019-05-25 NOTE — Progress Notes (Signed)
PATIENT: Hannah Duran DOB: 1934-06-19  Chief Complaint  Patient presents with  . Memory Loss    She is here with her son, Chriss Czar, to review her brain MRI findings.  Feels Seroquel '25mg'$ , one tab QHS only mildly improves hallucinations but two tabs causes excessive fatigue (often sleeps late the following day).   Recent MMSE was 28/30 (completed on 03/08/2019).      HISTORICAL  Hannah Duran is a 85 year old female, seen in request by her primary care physician Dr.Tower for evaluation of visual hallucinations she is accompanied by her son Ron at today's clinic visit on March 08, 2019.   I have reviewed and summarized the referring note from the referring physician.  She had a past medical history of hypertension, hyperlipidemia, diabetes, presented with gradual worsening visual hallucinations since January 2020.  She is a retired Network engineer, she lives alone since her husband passed away in November 05, 2014, she has lived in current house for 15 years, she was not able to give me a timeline of the history, she thought she only lived at her current house for 2 years.  Since January 2020, she was noted to have intermittent visual hallucinations, mostly at the nighttime, gradually getting worse, now she has visual hallucinations day and night, most of the hallucinations are focusing on her husband, she thought her disease to her husband came back to the home, was with a group of friends that she does not like him to be with, never changes his T-shirt, this bothers her a lot, sometimes she is very angry, has called her neighbors, 911 few times,  During the conversation, she was able to tell me the date her husband passed away in 11/05/14, but she still think her husband coming back to the house every day,   She also has gradual onset memory loss, making mistakes in her check balance, needing help from her son,  Her mother suffered dementia, maternal grandmother also suffered dementia  Personally reviewed CT head without contrast November 2019: No acute abnormality, moderate atrophy, mild small vessel disease. Laboratory evaluation in May 2020: Vitamin B12 138, she is on vitamin B12 supplements now Normal ESR, TSH, A1c, CMP showed elevated glucose 129, CBC showed hemoglobin of 11.4  UPDATE Sept 24 2020: She came in with her son, Seroquel 25 mg every night has helped her hallucinations, but she has significant side effect with 2 tablets every night, excessive sleepiness next day,    We personally reviewed MRI of the brain in July 2020, mild generalized atrophy, supratentorium small vessel disease no acute abnormality  REVIEW OF SYSTEMS: Full 14 system review of systems performed and notable only for as above All other review of systems were negative.  ALLERGIES: Allergies  Allergen Reactions  . Penicillins     REACTION: Rash  . Sulfamethoxazole-Trimethoprim     rash  . Amoxicillin Rash    HOME MEDICATIONS: Current Outpatient Medications  Medication Sig Dispense Refill  . atorvastatin (LIPITOR) 10 MG tablet Take 1 tablet (10 mg total) by mouth daily. 90 tablet 3  . cyanocobalamin (,VITAMIN B-12,) 1000 MCG/ML injection Inject 1,000 mcg into the muscle every 30 (thirty) days. Inject weekly X 4 weeks 5/11 - 01/31/19, then begin monthly injections.    . furosemide (LASIX) 20 MG tablet Take 1 tablet (20 mg total) by mouth daily. 90 tablet 3  . hydrochlorothiazide (MICROZIDE) 12.5 MG capsule Take 1 capsule (12.5 mg total) by mouth daily. 90 capsule 3  .  losartan (COZAAR) 50 MG tablet Take 1 tablet (50 mg total) by mouth daily. 90 tablet 3  . metFORMIN (GLUCOPHAGE) 500 MG tablet Take 1 tablet (500 mg total) by mouth 2 (two) times daily with a meal. 180 tablet 3  . QUEtiapine (SEROQUEL) 25 MG tablet Take 2 tablets (50 mg total) by mouth at bedtime. 60 tablet 11   No current facility-administered medications for this visit.     PAST MEDICAL HISTORY: Past Medical History:   Diagnosis Date  . Anxiety   . Congenital foot deformity   . Dermatophytosis   . Hallucinations   . HLD (hyperlipidemia)   . HTN (hypertension)   . Hyperglycemia   . Irregular heart rhythm    pt unsure of what type; followed by PCP only  . Ischemic colitis ([place]) 7/13   on colonoscopy  . Memory loss   . Obesity   . Rotator cuff syndrome     PAST SURGICAL HISTORY: Past Surgical History:  Procedure Laterality Date  . CATARACT EXTRACTION W/PHACO Right 09/09/2016   Procedure: CATARACT EXTRACTION PHACO AND INTRAOCULAR LENS PLACEMENT (IOC);  Surgeon: Leandrew Koyanagi, MD;  Location: [facility];  Service: Ophthalmology;  Laterality: Right;  right  . CATARACT EXTRACTION W/PHACO Left 12/16/2016   Procedure: CATARACT EXTRACTION PHACO AND INTRAOCULAR LENS PLACEMENT ([place])  Left;  Surgeon: Leandrew Koyanagi, MD;  Location: [facility];  Service: Ophthalmology;  Laterality: Left;  . HUMERUS FRACTURE SURGERY  10/1997  . TOTAL ABDOMINAL HYSTERECTOMY  1980's   due to menometrorrhagia    FAMILY HISTORY: Family History  Problem Relation Age of Onset  . Stroke Mother   . Parkinsonism Mother   . Hypertension Mother   . Alzheimer's disease Mother   . Cancer Father        bladder/prostate  . Breast cancer Neg Hx     SOCIAL HISTORY: Social History   Socioeconomic History  . Marital status: Widowed    Spouse name: Not on file  . Number of children: 2  . Years of education: 34  . Highest education level: High school graduate  Occupational History  . Occupation: retired Transport planner  . Financial resource strain: Not on file  . Food insecurity    Worry: Not on file    Inability: Not on file  . Transportation needs    Medical: Not on file    Non-medical: Not on file  Tobacco Use  . Smoking status: Never Smoker  . Smokeless tobacco: Never Used  Substance and Sexual Activity  . Alcohol use: No  . Drug use: No  . Sexual activity: Never  Lifestyle  .  Physical activity    Days per week: Not on file    Minutes per session: Not on file  . Stress: Not on file  Relationships  . Social Herbalist on phone: Not on file    Gets together: Not on file    Attends religious service: Not on file    Active member of club or organization: Not on file    Attends meetings of clubs or organizations: Not on file    Relationship status: Not on file  . Intimate partner violence    Fear of current or ex partner: Not on file    Emotionally abused: Not on file    Physically abused: Not on file    Forced sexual activity: Not on file  Other Topics Concern  . Not on file  Social History Narrative  Lives at home alone.   Right-handed.   One cup coffee per day.     PHYSICAL EXAM   Vitals:   05/25/19 1441  BP: (!) 142/67  Pulse: 62  Temp: 98.1 F (36.7 C)  Weight: 251 lb 8 oz (114.1 kg)  Height: 5' 4.5" (1.638 m)    Not recorded      Body mass index is 42.5 kg/m.  PHYSICAL EXAMNIATION:  Gen: NAD, conversant, well nourised, obese, well groomed                     Cardiovascular: Regular rate rhythm, no peripheral edema, warm, nontender. Eyes: Conjunctivae clear without exudates or hemorrhage Neck: Supple, no carotid bruits. Pulmonary: Clear to auscultation bilaterally   NEUROLOGICAL EXAM:  MMSE - Mini Mental State Exam 03/08/2019 05/25/2018 05/21/2017  Orientation to time '5 5 5  '$ Orientation to Place '5 5 5  '$ Registration '3 3 3  '$ Attention/ Calculation 5 0 0  Recall '1 3 2  '$ Recall-comments - - pt was unable to recall 1 of 3 words  Language- name 2 objects 2 0 0  Language- repeat '1 1 1  '$ Language- follow 3 step command '3 3 3  '$ Language- read & follow direction 1 0 0  Write a sentence 1 0 0  Copy design 1 0 0  Total score '28 20 19  '$ animal naming 6.   CRANIAL NERVES: CN II: Visual fields are full to confrontation.  Pupils are round equal and briskly reactive to light. CN III, IV, VI: extraocular movement are normal. No  ptosis. CN V: Facial sensation is intact to pinprick in all 3 divisions bilaterally. Corneal responses are intact.  CN VII: Face is symmetric with normal eye closure and smile. CN VIII: Hearing is normal to rubbing fingers CN IX, X: Palate elevates symmetrically. Phonation is normal. CN XI: Head turning and shoulder shrug are intact CN XII: Tongue is midline with normal movements and no atrophy.  MOTOR: There is no pronator drift of out-stretched arms. Muscle bulk and tone are normal. Muscle strength is normal.  REFLEXES: Reflexes are 2+ and symmetric at the biceps, triceps, knees, and ankles. Plantar responses are flexor.  SENSORY: Intact to light touch, pinprick, positional sensation and vibratory sensation are intact in fingers and toes.  COORDINATION: Rapid alternating movements and fine finger movements are intact. There is no dysmetria on finger-to-nose and heel-knee-shin.    GAIT/STANCE: She needs to push up to get up from seated position, steady   DIAGNOSTIC DATA (LABS, IMAGING, TESTING) - I reviewed patient records, labs, notes, testing and imaging myself where available.   ASSESSMENT AND PLAN  Monique Hefty is a 85 y.o. female   Visual hallucinations Memory loss  Consistent with central nervous system degenerative disorders,  There is no significant parkinsonian features on today's examination  MRI of the brain showed mild generalized atrophy, supratentorium small vessel disease,  Keep Seroquel 25 milligrams, may titrate up to 37.5 mg if needed  UA to rule out UTI  Vitamin B12 deficiency  Continue Vitamin B12 supplement  Marcial Pacas, M.D. Ph.D.  [facility] [address], [place], [place] [address] Ph: [phone] Fax: [phone]  CC: Referring Provider

## 2019-05-26 LAB — URINALYSIS, ROUTINE W REFLEX MICROSCOPIC
Bilirubin, UA: NEGATIVE
Glucose, UA: NEGATIVE
Ketones, UA: NEGATIVE
Nitrite, UA: NEGATIVE
Protein,UA: NEGATIVE
RBC, UA: NEGATIVE
Specific Gravity, UA: 1.027 (ref 1.005–1.030)
Urobilinogen, Ur: 1 mg/dL (ref 0.2–1.0)
pH, UA: 6.5 (ref 5.0–7.5)

## 2019-05-26 LAB — MICROSCOPIC EXAMINATION
Bacteria, UA: NONE SEEN
Casts: NONE SEEN /lpf
RBC, Urine: NONE SEEN /hpf (ref 0–2)

## 2019-05-27 LAB — URINE CULTURE

## 2019-05-29 ENCOUNTER — Ambulatory Visit (INDEPENDENT_AMBULATORY_CARE_PROVIDER_SITE_OTHER): Payer: PPO

## 2019-05-29 ENCOUNTER — Telehealth: Payer: Self-pay | Admitting: Neurology

## 2019-05-29 ENCOUNTER — Ambulatory Visit: Payer: PPO

## 2019-05-29 DIAGNOSIS — Z0000 Encounter for general adult medical examination without abnormal findings: Secondary | ICD-10-CM | POA: Diagnosis not present

## 2019-05-29 MED ORDER — NITROFURANTOIN MONOHYD MACRO 100 MG PO CAPS: ORAL_CAPSULE | ORAL | 0 refills | Status: DC

## 2019-05-29 NOTE — Telephone Encounter (Signed)
I spoke to her son, Amaiya Mapps, who verbalized understanding of the results.  He would like for the antibiotic to be called in by Dr. Krista Blue.  He is in agreement have the patient follow up with her PCP to make sure the UTI is resolved.

## 2019-05-29 NOTE — Progress Notes (Signed)
PCP notes: none  Health Maintenance: Patient wants flu vaccine at next office visit. Patient declined Shingrix. Patient only gets diabetic eye exams when she has a problem.     Abnormal Screenings: none    Patient concerns: none    Nurse concerns: none    Next PCP appt.: 06/05/2019 @ 11:30 am

## 2019-05-29 NOTE — Progress Notes (Signed)
Subjective:   Hannah Duran is a 85 y.o. female who presents for Medicare Annual (Subsequent) preventive examination.  Review of Systems:    This visit is being conducted through telemedicine via telephone at the nurse health advisor's home address due to the COVID-19 pandemic. This patient has given me verbal consent via doximity to conduct this visit, patient states they are participating from their home address. Some vital signs may be absent or patient reported.    Patient identification: identified by name, DOB, and current address  Cardiac Risk Factors include: advanced age (>66men, >40 women);diabetes mellitus;hypertension;dyslipidemia;sedentary lifestyle     Objective:     Vitals: LMP 08/31/1980   There is no height or weight on file to calculate BMI.  Advanced Directives 05/29/2019 07/24/2018 05/25/2018 05/21/2017 12/16/2016 09/09/2016  Does Patient Have a Medical Advance Directive? Yes Yes Yes Yes Yes Yes  Type of Paramedic of [place];Living will [place];Living will [place];Living will [place];Living will [place];Living will [place];Living will  Does patient want to make changes to medical advance directive? - No - Patient declined - - - No - Patient declined  Copy of [place] in Chart? No - copy requested No - copy requested No - copy requested No - copy requested No - copy requested No - copy requested    Tobacco Social History   Tobacco Use  Smoking Status Never Smoker  Smokeless Tobacco Never Used     Counseling given: Not Answered   Clinical Intake:  Pre-visit preparation completed: Yes  Pain : No/denies pain     Nutritional Risks: None Diabetes: Yes CBG done?: No Did pt. bring in CBG monitor from home?: No  How often do you need to have someone help you when you read instructions, pamphlets, or other  written materials from your doctor or pharmacy?: 1 - Never What is the last grade level you completed in school?: high school graduate     Information entered by :: CJohnson, LPN  Past Medical History:  Diagnosis Date  . Anxiety   . Congenital foot deformity   . Dermatophytosis   . Hallucinations   . HLD (hyperlipidemia)   . HTN (hypertension)   . Hyperglycemia   . Irregular heart rhythm    pt unsure of what type; followed by PCP only  . Ischemic colitis ([place]) 7/13   on colonoscopy  . Memory loss   . Obesity   . Rotator cuff syndrome    Past Surgical History:  Procedure Laterality Date  . CATARACT EXTRACTION W/PHACO Right 09/09/2016   Procedure: CATARACT EXTRACTION PHACO AND INTRAOCULAR LENS PLACEMENT (IOC);  Surgeon: Leandrew Koyanagi, MD;  Location: [facility];  Service: Ophthalmology;  Laterality: Right;  right  . CATARACT EXTRACTION W/PHACO Left 12/16/2016   Procedure: CATARACT EXTRACTION PHACO AND INTRAOCULAR LENS PLACEMENT ([place])  Left;  Surgeon: Leandrew Koyanagi, MD;  Location: [facility];  Service: Ophthalmology;  Laterality: Left;  . HUMERUS FRACTURE SURGERY  10/1997  . TOTAL ABDOMINAL HYSTERECTOMY  1980's   due to menometrorrhagia   Family History  Problem Relation Age of Onset  . Stroke Mother   . Parkinsonism Mother   . Hypertension Mother   . Alzheimer's disease Mother   . Cancer Father        bladder/prostate  . Breast cancer Neg Hx    Social History   Socioeconomic History  . Marital status:  Widowed    Spouse name: Not on file  . Number of children: 2  . Years of education: 74  . Highest education level: High school graduate  Occupational History  . Occupation: retired Transport planner  . Financial resource strain: Not hard at all  . Food insecurity    Worry: Never true    Inability: Never true  . Transportation needs    Medical: No    Non-medical: No  Tobacco Use  . Smoking status: Never Smoker  . Smokeless  tobacco: Never Used  Substance and Sexual Activity  . Alcohol use: No  . Drug use: No  . Sexual activity: Never  Lifestyle  . Physical activity    Days per week: 0 days    Minutes per session: 0 min  . Stress: Not at all  Relationships  . Social Herbalist on phone: Not on file    Gets together: Not on file    Attends religious service: Not on file    Active member of club or organization: Not on file    Attends meetings of clubs or organizations: Not on file    Relationship status: Not on file  Other Topics Concern  . Not on file  Social History Narrative   Lives at home alone.   Right-handed.   One cup coffee per day.    Outpatient Encounter Medications as of 05/29/2019  Medication Sig  . ALPRAZolam (XANAX) 0.5 MG tablet Take 1 tablet (0.5 mg total) by mouth at bedtime as needed for anxiety.  Marland Kitchen atorvastatin (LIPITOR) 10 MG tablet Take 1 tablet (10 mg total) by mouth daily.  . cyanocobalamin (,VITAMIN B-12,) 1000 MCG/ML injection Inject 1,000 mcg into the muscle every 30 (thirty) days. Inject weekly X 4 weeks 5/11 - 01/31/19, then begin monthly injections.  . furosemide (LASIX) 20 MG tablet Take 1 tablet (20 mg total) by mouth daily.  . hydrochlorothiazide (MICROZIDE) 12.5 MG capsule Take 1 capsule (12.5 mg total) by mouth daily.  Marland Kitchen losartan (COZAAR) 50 MG tablet Take 1 tablet (50 mg total) by mouth daily.  . metFORMIN (GLUCOPHAGE) 500 MG tablet Take 1 tablet (500 mg total) by mouth 2 (two) times daily with a meal.  . QUEtiapine (SEROQUEL) 25 MG tablet Take 2 tablets (50 mg total) by mouth at bedtime.   No facility-administered encounter medications on file as of 05/29/2019.     Activities of Daily Living In your present state of health, do you have any difficulty performing the following activities: 05/29/2019  Hearing? Y  Comment have to have people repeat what they are saying sometimes  Vision? N  Difficulty concentrating or making decisions? Y  Comment Patient  forgets the days of the week sometimes  Walking or climbing stairs? N  Dressing or bathing? N  Doing errands, shopping? N  Preparing Food and eating ? N  Using the Toilet? N  In the past six months, have you accidently leaked urine? Y  Comment wears a pad daily  Do you have problems with loss of bowel control? N  Managing your Medications? N  Managing your Finances? N  Housekeeping or managing your Housekeeping? N  Some recent data might be hidden    Patient Care Team: Tower, Wynelle Fanny, MD as PCP - General Oneta Rack, MD as Consulting Physician (Dermatology)    Assessment:   This is a routine wellness examination for [facility].  Exercise Activities and Dietary recommendations Current Exercise Habits: The  patient does not participate in regular exercise at present, Exercise limited by: None identified  Goals    . Increase water intake     Starting 05/25/2018, I will attempt to drink at least 6-8 glasses of water daily.     . Patient Stated     05/29/2019, Patient wants to decrease sugar intake and help improve diabetes       Fall Risk Fall Risk  05/29/2019 05/25/2018 05/21/2017 05/14/2016 06/18/2014  Falls in the past year? 0 No No No No  Number falls in past yr: 0 - - - -  Risk for fall due to : Medication side effect - - - -  Follow up Falls evaluation completed;Falls prevention discussed - - - -   Is the patient's home free of loose throw rugs in walkways, pet beds, electrical cords, etc?   yes      Grab bars in the bathroom? yes      Handrails on the stairs?   yes      Adequate lighting?   yes  Timed Get Up and Go performed: n/a  Depression Screen PHQ 2/9 Scores 05/29/2019 05/25/2018 05/21/2017 05/14/2016  PHQ - 2 Score 0 1 0 0  PHQ- 9 Score 0 1 0 -     Cognitive Function MMSE - Mini Mental State Exam 05/29/2019 03/08/2019 05/25/2018 05/21/2017 05/14/2016  Orientation to time 5 5 5 5 5   Orientation to Place 5 5 5 5 5   Registration 3 3 3 3 3   Attention/ Calculation 5 5 0  0 0  Recall 3 1 3 2 3   Recall-comments - - - pt was unable to recall 1 of 3 words -  Language- name 2 objects - 2 0 0 0  Language- repeat 1 1 1 1 1   Language- follow 3 step command - 3 3 3 3   Language- read & follow direction - 1 0 0 0  Write a sentence - 1 0 0 0  Copy design - 1 0 0 0  Total score - 28 20 19 20   Mini Cog  Mini-Cog screen was completed. Maximum score is 22. A value of 0 denotes this part of the MMSE was not completed or the patient failed this part of the Mini-Cog screening.      Immunization History  Administered Date(s) Administered  . Influenza Split 06/04/2011, 05/18/2012  . Influenza Whole 06/24/2007, 06/05/2008, 06/05/2009, 05/22/2010  . Influenza,inj,Quad PF,6+ Mos 05/17/2013, 05/22/2014, 07/10/2015, 05/31/2018  . Pneumococcal Conjugate-13 06/18/2014  . Pneumococcal Polysaccharide-23 11/20/2009  . Td 06/22/2001  . Tdap 12/01/2010    Qualifies for Shingles Vaccine? yes  Screening Tests Health Maintenance  Topic Date Due  . FOOT EXAM  05/28/2018  . INFLUENZA VACCINE  04/01/2019  . OPHTHALMOLOGY EXAM  05/29/2019 (Originally 08/02/2018)  . MAMMOGRAM  06/09/2019  . HEMOGLOBIN A1C  07/08/2019  . TETANUS/TDAP  11/30/2020  . DEXA SCAN  Completed  . PNA vac Low Risk Adult  Completed    Cancer Screenings: Lung: Low Dose CT Chest recommended if Age 108-80 years, 30 pack-year currently smoking OR have quit w/in 15years. Patient does not qualify. Breast:  Up to date on Mammogram? Yes, completed 06/08/2018 Up to date of Bone Density/Dexa? Yes, completed 06/02/2013 Colorectal: no longer required  Additional Screenings:  Hepatitis C Screening: n/a     Plan:   Patient wants to decrease sugar intake and improve her diabetes.   I have personally reviewed and noted the following in the patient's chart:   .  Medical and social history . Use of alcohol, tobacco or illicit drugs  . Current medications and supplements . Functional ability and status .  Nutritional status . Physical activity . Advanced directives . List of other physicians . Hospitalizations, surgeries, and ER visits in previous 12 months . Vitals . Screenings to include cognitive, depression, and falls . Referrals and appointments  In addition, I have reviewed and discussed with patient certain preventive protocols, quality metrics, and best practice recommendations. A written personalized care plan for preventive services as well as general preventive health recommendations were provided to patient.     Andrez Grime, LPN  [phone]

## 2019-05-29 NOTE — Telephone Encounter (Addendum)
Please call patient, UA showed evidence of mild UTI, culture showed mixed urogenital flora,    May consider nitrofurantoin 100mg  bid x 5 day or ask her pcp for treatment

## 2019-05-29 NOTE — Addendum Note (Signed)
Addended by: Desmond Lope on: 05/29/2019 05:21 PM   Modules accepted: Orders

## 2019-05-29 NOTE — Patient Instructions (Signed)
[name] , Thank you for taking time to come for your Medicare Wellness Visit. I appreciate your ongoing commitment to your health goals. Please review the following plan we discussed and let me know if I can assist you in the future.   Screening recommendations/referrals: Colonoscopy: no longer required Mammogram: up to date, completed 06/08/2018 Bone Density: up to date, completed 06/02/2013 Recommended yearly ophthalmology/optometry visit for glaucoma screening and checkup Recommended yearly dental visit for hygiene and checkup  Vaccinations: Influenza vaccine: will get at next office visit  Pneumococcal vaccine: series completed Tdap vaccine: up to date, completed 12/01/2010 Shingles vaccine: declined    Advanced directives: Please bring a copy of your POA (Power of [place]) and/or Living Will to your next appointment.   Conditions/risks identified: diabetes, hypertension, hyperlipidemia   Next appointment: 06/05/2019 @ 11:30 am    Preventive Care 28 Years and Older, Female Preventive care refers to lifestyle choices and visits with your health care provider that can promote health and wellness. What does preventive care include?  A yearly physical exam. This is also called an annual well check.  Dental exams once or twice a year.  Routine eye exams. Ask your health care provider how often you should have your eyes checked.  Personal lifestyle choices, including:  Daily care of your teeth and gums.  Regular physical activity.  Eating a healthy diet.  Avoiding tobacco and drug use.  Limiting alcohol use.  Practicing safe sex.  Taking low-dose aspirin every day.  Taking vitamin and mineral supplements as recommended by your health care provider. What happens during an annual well check? The services and screenings done by your health care provider during your annual well check will depend on your age, overall health, lifestyle risk factors, and family history of  disease. Counseling  Your health care provider may ask you questions about your:  Alcohol use.  Tobacco use.  Drug use.  Emotional well-being.  Home and relationship well-being.  Sexual activity.  Eating habits.  History of falls.  Memory and ability to understand (cognition).  Work and work Statistician.  Reproductive health. Screening  You may have the following tests or measurements:  Height, weight, and BMI.  Blood pressure.  Lipid and cholesterol levels. These may be checked every 5 years, or more frequently if you are over 62 years old.  Skin check.  Lung cancer screening. You may have this screening every year starting at age 43 if you have a 30-pack-year history of smoking and currently smoke or have quit within the past 15 years.  Fecal occult blood test (FOBT) of the stool. You may have this test every year starting at age 86.  Flexible sigmoidoscopy or colonoscopy. You may have a sigmoidoscopy every 5 years or a colonoscopy every 10 years starting at age 64.  Hepatitis C blood test.  Hepatitis B blood test.  Sexually transmitted disease (STD) testing.  Diabetes screening. This is done by checking your blood sugar (glucose) after you have not eaten for a while (fasting). You may have this done every 1-3 years.  Bone density scan. This is done to screen for osteoporosis. You may have this done starting at age 68.  Mammogram. This may be done every 1-2 years. Talk to your health care provider about how often you should have regular mammograms. Talk with your health care provider about your test results, treatment options, and if necessary, the need for more tests. Vaccines  Your health care provider may recommend certain vaccines,  such as:  Influenza vaccine. This is recommended every year.  Tetanus, diphtheria, and acellular pertussis (Tdap, Td) vaccine. You may need a Td booster every 10 years.  Zoster vaccine. You may need this after age 59.   Pneumococcal 13-valent conjugate (PCV13) vaccine. One dose is recommended after age 5.  Pneumococcal polysaccharide (PPSV23) vaccine. One dose is recommended after age 74. Talk to your health care provider about which screenings and vaccines you need and how often you need them. This information is not intended to replace advice given to you by your health care provider. Make sure you discuss any questions you have with your health care provider. Document Released: 09/13/2015 Document Revised: 05/06/2016 Document Reviewed: 06/18/2015 Elsevier Interactive Patient Education  2017 [place] Prevention in the Home Falls can cause injuries. They can happen to people of all ages. There are many things you can do to make your home safe and to help prevent falls. What can I do on the outside of my home?  Regularly fix the edges of walkways and driveways and fix any cracks.  Remove anything that might make you trip as you walk through a door, such as a raised step or threshold.  Trim any bushes or trees on the path to your home.  Use bright outdoor lighting.  Clear any walking paths of anything that might make someone trip, such as rocks or tools.  Regularly check to see if handrails are loose or broken. Make sure that both sides of any steps have handrails.  Any raised decks and porches should have guardrails on the edges.  Have any leaves, snow, or ice cleared regularly.  Use sand or salt on walking paths during winter.  Clean up any spills in your garage right away. This includes oil or grease spills. What can I do in the bathroom?  Use night lights.  Install grab bars by the toilet and in the tub and shower. Do not use towel bars as grab bars.  Use non-skid mats or decals in the tub or shower.  If you need to sit down in the shower, use a plastic, non-slip stool.  Keep the floor dry. Clean up any water that spills on the floor as soon as it happens.  Remove soap  buildup in the tub or shower regularly.  Attach bath mats securely with double-sided non-slip rug tape.  Do not have throw rugs and other things on the floor that can make you trip. What can I do in the bedroom?  Use night lights.  Make sure that you have a light by your bed that is easy to reach.  Do not use any sheets or blankets that are too big for your bed. They should not hang down onto the floor.  Have a firm chair that has side arms. You can use this for support while you get dressed.  Do not have throw rugs and other things on the floor that can make you trip. What can I do in the kitchen?  Clean up any spills right away.  Avoid walking on wet floors.  Keep items that you use a lot in easy-to-reach places.  If you need to reach something above you, use a strong step stool that has a grab bar.  Keep electrical cords out of the way.  Do not use floor polish or wax that makes floors slippery. If you must use wax, use non-skid floor wax.  Do not have throw rugs and other things on  the floor that can make you trip. What can I do with my stairs?  Do not leave any items on the stairs.  Make sure that there are handrails on both sides of the stairs and use them. Fix handrails that are broken or loose. Make sure that handrails are as long as the stairways.  Check any carpeting to make sure that it is firmly attached to the stairs. Fix any carpet that is loose or worn.  Avoid having throw rugs at the top or bottom of the stairs. If you do have throw rugs, attach them to the floor with carpet tape.  Make sure that you have a light switch at the top of the stairs and the bottom of the stairs. If you do not have them, ask someone to add them for you. What else can I do to help prevent falls?  Wear shoes that:  Do not have high heels.  Have rubber bottoms.  Are comfortable and fit you well.  Are closed at the toe. Do not wear sandals.  If you use a stepladder:  Make  sure that it is fully opened. Do not climb a closed stepladder.  Make sure that both sides of the stepladder are locked into place.  Ask someone to hold it for you, if possible.  Clearly mark and make sure that you can see:  Any grab bars or handrails.  First and last steps.  Where the edge of each step is.  Use tools that help you move around (mobility aids) if they are needed. These include:  Canes.  Walkers.  Scooters.  Crutches.  Turn on the lights when you go into a dark area. Replace any light bulbs as soon as they burn out.  Set up your furniture so you have a clear path. Avoid moving your furniture around.  If any of your floors are uneven, fix them.  If there are any pets around you, be aware of where they are.  Review your medicines with your doctor. Some medicines can make you feel dizzy. This can increase your chance of falling. Ask your doctor what other things that you can do to help prevent falls. This information is not intended to replace advice given to you by your health care provider. Make sure you discuss any questions you have with your health care provider. Document Released: 06/13/2009 Document Revised: 01/23/2016 Document Reviewed: 09/21/2014 Elsevier Interactive Patient Education  2017 [employer].

## 2019-06-05 ENCOUNTER — Other Ambulatory Visit: Payer: Self-pay

## 2019-06-05 ENCOUNTER — Ambulatory Visit (INDEPENDENT_AMBULATORY_CARE_PROVIDER_SITE_OTHER): Payer: PPO | Admitting: Family Medicine

## 2019-06-05 ENCOUNTER — Encounter: Payer: Self-pay | Admitting: Family Medicine

## 2019-06-05 VITALS — BP 130/75 | HR 69 | Temp 98.1°F | Ht 65.5 in | Wt 253.1 lb

## 2019-06-05 DIAGNOSIS — I10 Essential (primary) hypertension: Secondary | ICD-10-CM

## 2019-06-05 DIAGNOSIS — E1169 Type 2 diabetes mellitus with other specified complication: Secondary | ICD-10-CM

## 2019-06-05 DIAGNOSIS — E119 Type 2 diabetes mellitus without complications: Secondary | ICD-10-CM | POA: Diagnosis not present

## 2019-06-05 DIAGNOSIS — E785 Hyperlipidemia, unspecified: Secondary | ICD-10-CM

## 2019-06-05 DIAGNOSIS — E7800 Pure hypercholesterolemia, unspecified: Secondary | ICD-10-CM | POA: Diagnosis not present

## 2019-06-05 DIAGNOSIS — Z23 Encounter for immunization: Secondary | ICD-10-CM

## 2019-06-05 DIAGNOSIS — E538 Deficiency of other specified B group vitamins: Secondary | ICD-10-CM

## 2019-06-05 DIAGNOSIS — Z0000 Encounter for general adult medical examination without abnormal findings: Secondary | ICD-10-CM

## 2019-06-05 DIAGNOSIS — R441 Visual hallucinations: Secondary | ICD-10-CM | POA: Diagnosis not present

## 2019-06-05 DIAGNOSIS — N3001 Acute cystitis with hematuria: Secondary | ICD-10-CM

## 2019-06-05 LAB — LIPID PANEL
Cholesterol: 195 mg/dL (ref 0–200)
HDL: 41.8 mg/dL (ref >39.00)
LDL Cholesterol: 130 mg/dL — ABNORMAL HIGH (ref 0–99)
NonHDL: 153.34
Total CHOL/HDL Ratio: 5
Triglycerides: 118 mg/dL (ref 0.0–149.0)
VLDL: 23.6 mg/dL (ref 0.0–40.0)

## 2019-06-05 LAB — CBC WITH DIFFERENTIAL/PLATELET
Basophils Absolute: 0.1 10*3/uL (ref 0.0–0.1)
Basophils Relative: 0.9 % (ref 0.0–3.0)
Eosinophils Absolute: 0.3 10*3/uL (ref 0.0–0.7)
Eosinophils Relative: 2.7 % (ref 0.0–5.0)
HCT: 36.4 % (ref 36.0–46.0)
Hemoglobin: 11.4 g/dL — ABNORMAL LOW (ref 12.0–15.0)
Lymphocytes Relative: 13.2 % (ref 12.0–46.0)
Lymphs Abs: 1.3 10*3/uL (ref 0.7–4.0)
MCHC: 31.4 g/dL (ref 30.0–36.0)
MCV: 81.4 fl (ref 78.0–100.0)
Monocytes Absolute: 0.9 10*3/uL (ref 0.1–1.0)
Monocytes Relative: 8.7 % (ref 3.0–12.0)
Neutro Abs: 7.3 10*3/uL (ref 1.4–7.7)
Neutrophils Relative %: 74.5 % (ref 43.0–77.0)
Platelets: 306 10*3/uL (ref 150.0–400.0)
RBC: 4.46 Mil/uL (ref 3.87–5.11)
RDW: 16.6 % — ABNORMAL HIGH (ref 11.5–15.5)
WBC: 9.8 10*3/uL (ref 4.0–10.5)

## 2019-06-05 LAB — COMPREHENSIVE METABOLIC PANEL
ALT: 12 U/L (ref 0–35)
AST: 17 U/L (ref 0–37)
Albumin: 4 g/dL (ref 3.5–5.2)
Alkaline Phosphatase: 97 U/L (ref 39–117)
BUN: 16 mg/dL (ref 6–23)
CO2: 28 mEq/L (ref 19–32)
Calcium: 9.1 mg/dL (ref 8.4–10.5)
Chloride: 105 mEq/L (ref 96–112)
Creatinine, Ser: 0.74 mg/dL (ref 0.40–1.20)
GFR: 74.93 mL/min (ref >60.00)
Glucose, Bld: 110 mg/dL — ABNORMAL HIGH (ref 70–99)
Potassium: 3.7 mEq/L (ref 3.5–5.1)
Sodium: 140 mEq/L (ref 135–145)
Total Bilirubin: 0.7 mg/dL (ref 0.2–1.2)
Total Protein: 6.5 g/dL (ref 6.0–8.3)

## 2019-06-05 LAB — VITAMIN B12: Vitamin B-12: >1500 pg/mL — ABNORMAL HIGH (ref 211–911)

## 2019-06-05 LAB — TSH: TSH: 4.25 u[IU]/mL (ref 0.35–4.50)

## 2019-06-05 LAB — HEMOGLOBIN A1C: Hgb A1c MFr Bld: 6.7 % — ABNORMAL HIGH (ref 4.6–6.5)

## 2019-06-05 MED ORDER — CYANOCOBALAMIN 1000 MCG/ML IJ SOLN
1000.0000 ug | Freq: Once | INTRAMUSCULAR | Status: AC
  Administered 2019-06-05: 1000 ug via INTRAMUSCULAR

## 2019-06-05 MED ORDER — FUROSEMIDE 20 MG PO TABS: 20.0000 mg | ORAL_TABLET | Freq: Every day | ORAL | 3 refills | Status: DC

## 2019-06-05 MED ORDER — ATORVASTATIN CALCIUM 10 MG PO TABS: 10.0000 mg | ORAL_TABLET | Freq: Every day | ORAL | 3 refills | Status: DC

## 2019-06-05 MED ORDER — METFORMIN HCL 500 MG PO TABS: 500.0000 mg | ORAL_TABLET | Freq: Two times a day (BID) | ORAL | 3 refills | Status: DC

## 2019-06-05 MED ORDER — HYDROCHLOROTHIAZIDE 12.5 MG PO CAPS: 12.5000 mg | ORAL_CAPSULE | Freq: Every day | ORAL | 3 refills | Status: DC

## 2019-06-05 MED ORDER — LOSARTAN POTASSIUM 50 MG PO TABS: 50.0000 mg | ORAL_TABLET | Freq: Every day | ORAL | 3 refills | Status: DC

## 2019-06-05 NOTE — Patient Instructions (Addendum)
Call your insurance company and ask about coverage for mammograms  You are due for one and can schedule your own appointment   Try to get 1200-1500 mg of calcium per day with at least 1000 iu of vitamin D - for bone health   For diabetes Try to get most of your carbohydrates from produce (with the exception of white potatoes)  Eat less bread/pasta/rice/snack foods/cereals/sweets and other items from the middle of the grocery store (processed carbs)   Flu shot today  B12 shot today   Labs today

## 2019-06-05 NOTE — Assessment & Plan Note (Signed)
Due for labs -done Also B12 shot today

## 2019-06-05 NOTE — Progress Notes (Signed)
Subjective:    Patient ID: Hannah Duran, female    DOB: 05-11-1934, 85 y.o.   MRN: [id number]  HPI Here for health maintenance exam and to review chronic medical problems   Feeling ok in general   Had amw on 9/28 Only wants to get eye exams if there is a problem -went recently for eye infection   Flu shot -today  She declines shingrix    Mammogram 10/19 -her insurance did not pay for it (? Why)   Self breast exam -no lumps   dexa 10/14 -declines dexa No falls  No fractures  She takes vitamins   Colon screen  Colonoscopy 5/13-had a mucosal ulceration at that time   Recent hx of hallucinations-saw neuro MRI showed some small vessel change and atrophy Possible CNS deg disorder/no parkinsons symptoms  Taking seroquel  Was also dx with mild uti -cx showed mixed flora  tx with macrobid -does not feel different  abx cause bowel trouble -still on it (5 d course)    Wt Readings from Last 3 Encounters:  06/05/19 253 lb 1 oz (114.8 kg)  05/25/19 251 lb 8 oz (114.1 kg)  03/08/19 244 lb (110.7 kg)  tries to eat healthy  She craves things Eats a lot of vegetables  Working for exercise - outdoor work and housework  41.47 kg/m   bp is stable today  No cp or palpitations or headaches or edema  No side effects to medicines  BP Readings from Last 3 Encounters:  06/05/19 (!) 140/92  05/25/19 (!) 142/67  03/08/19 (!) 153/70    2nd bp better BP: 130/75   DM2 Lab Results  Component Value Date   HGBA1C 6.4 01/05/2019  due for labs  Not interested in dm eye exam Does eat some sweets (has craving)  Takes metformin  On arb and statin     Hyperlipidemia Lab Results  Component Value Date   CHOL 196 01/05/2019   HDL 39.20 01/05/2019   LDLCALC 125 (H) 01/05/2019   TRIG 161.0 (H) 01/05/2019   CHOLHDL 5 01/05/2019   Due for labs Taking atorvastatin 10 mg  No fried or fatty foods   Also B12 Lab Results  Component Value Date   VITAMINB12 224 03/29/2019   Due  for labs  Getting B12 shot every 30 days   Patient Active Problem List   Diagnosis Date Noted  . Vitamin B12 deficiency 03/29/2019  . Change in mental status 01/05/2019  . Visual hallucinations 01/05/2019  . History of recurrent UTIs 01/05/2019  . UTI (urinary tract infection) 08/01/2018  . Screening mammogram, encounter for 05/31/2018  . Encounter for screening mammogram for breast cancer 05/28/2017  . Routine general medical examination at a health care facility 05/19/2016  . Ankle edema 05/01/2015  . Venous (peripheral) insufficiency 05/01/2015  . Cutaneous skin tags 05/23/2014  . Fungal nail infection 09/05/2013  . Encounter for Medicare annual wellness exam 06/02/2013  . Internal hemorrhoid 06/21/2012  . Morbid obesity ([place])   . HTN (hypertension)   . HLD (hyperlipidemia)   . Diabetes type 2, controlled ([place])    Past Medical History:  Diagnosis Date  . Anxiety   . Congenital foot deformity   . Dermatophytosis   . Hallucinations   . HLD (hyperlipidemia)   . HTN (hypertension)   . Hyperglycemia   . Irregular heart rhythm    pt unsure of what type; followed by PCP only  . Ischemic colitis ([place]) 7/13   on colonoscopy  .  Memory loss   . Obesity   . Rotator cuff syndrome    Past Surgical History:  Procedure Laterality Date  . CATARACT EXTRACTION W/PHACO Right 09/09/2016   Procedure: CATARACT EXTRACTION PHACO AND INTRAOCULAR LENS PLACEMENT (IOC);  Surgeon: Leandrew Koyanagi, MD;  Location: [facility];  Service: Ophthalmology;  Laterality: Right;  right  . CATARACT EXTRACTION W/PHACO Left 12/16/2016   Procedure: CATARACT EXTRACTION PHACO AND INTRAOCULAR LENS PLACEMENT ([place])  Left;  Surgeon: Leandrew Koyanagi, MD;  Location: [facility];  Service: Ophthalmology;  Laterality: Left;  . HUMERUS FRACTURE SURGERY  10/1997  . TOTAL ABDOMINAL HYSTERECTOMY  1980's   due to menometrorrhagia   Social History   Tobacco Use  . Smoking status: Never Smoker  .  Smokeless tobacco: Never Used  Substance Use Topics  . Alcohol use: No  . Drug use: No   Family History  Problem Relation Age of Onset  . Stroke Mother   . Parkinsonism Mother   . Hypertension Mother   . Alzheimer's disease Mother   . Cancer Father        bladder/prostate  . Breast cancer Neg Hx    Allergies  Allergen Reactions  . Penicillins     REACTION: Rash  . Sulfamethoxazole-Trimethoprim     rash  . Amoxicillin Rash   Current Outpatient Medications on File Prior to Visit  Medication Sig Dispense Refill  . ALPRAZolam (XANAX) 0.5 MG tablet Take 1 tablet (0.5 mg total) by mouth at bedtime as needed for anxiety. 30 tablet 5  . cyanocobalamin (,VITAMIN B-12,) 1000 MCG/ML injection Inject 1,000 mcg into the muscle every 30 (thirty) days. Inject weekly X 4 weeks 5/11 - 01/31/19, then begin monthly injections.    . nitrofurantoin, macrocrystal-monohydrate, (MACROBID) 100 MG capsule Take one capsule BID x 5 days. 10 capsule 0  . QUEtiapine (SEROQUEL) 25 MG tablet Take 2 tablets (50 mg total) by mouth at bedtime. 60 tablet 11   No current facility-administered medications on file prior to visit.     Review of Systems  Constitutional: Negative for activity change, appetite change, fatigue, fever and unexpected weight change.  HENT: Negative for congestion, ear pain, rhinorrhea, sinus pressure and sore throat.   Eyes: Negative for pain, redness and visual disturbance.  Respiratory: Negative for cough, shortness of breath and wheezing.   Cardiovascular: Negative for chest pain and palpitations.  Gastrointestinal: Negative for abdominal pain, blood in stool, constipation and diarrhea.  Endocrine: Negative for polydipsia and polyuria.  Genitourinary: Negative for dysuria, frequency and urgency.  Musculoskeletal: Positive for arthralgias. Negative for back pain and myalgias.  Skin: Negative for pallor and rash.  Allergic/Immunologic: Negative for environmental allergies.   Neurological: Negative for dizziness, syncope and headaches.  Hematological: Negative for adenopathy. Does not bruise/bleed easily.  Psychiatric/Behavioral: Positive for hallucinations. Negative for decreased concentration and dysphoric mood. The patient is not nervous/anxious.        Objective:   Physical Exam Constitutional:      General: She is not in acute distress.    Appearance: Normal appearance. She is well-developed. She is obese. She is not ill-appearing or diaphoretic.  HENT:     Head: Normocephalic and atraumatic.     Right Ear: Tympanic membrane, ear canal and external ear normal.     Left Ear: Tympanic membrane, ear canal and external ear normal.     Nose: Nose normal. No congestion.     Mouth/Throat:     Mouth: Mucous membranes are moist.  Pharynx: Oropharynx is clear. No posterior oropharyngeal erythema.  Eyes:     General: No scleral icterus.    Extraocular Movements: Extraocular movements intact.     Conjunctiva/sclera: Conjunctivae normal.     Pupils: Pupils are equal, round, and reactive to light.  Neck:     Musculoskeletal: Normal range of motion and neck supple. No neck rigidity or muscular tenderness.     Thyroid: No thyromegaly.     Vascular: No carotid bruit or JVD.  Cardiovascular:     Rate and Rhythm: Normal rate and regular rhythm.     Pulses: Normal pulses.     Heart sounds: Normal heart sounds. No gallop.   Pulmonary:     Effort: Pulmonary effort is normal. No respiratory distress.     Breath sounds: Normal breath sounds. No wheezing.     Comments: Good air exch Chest:     Chest wall: No tenderness.  Abdominal:     General: Bowel sounds are normal. There is no distension or abdominal bruit.     Palpations: Abdomen is soft. There is no mass.     Tenderness: There is no abdominal tenderness.     Hernia: No hernia is present.  Genitourinary:    Comments: Breast exam: No mass, nodules, thickening, tenderness, bulging, retraction, inflamation,  nipple discharge or skin changes noted.  No axillary or clavicular LA.     Musculoskeletal: Normal range of motion.        General: No tenderness.     Right lower leg: No edema.     Left lower leg: No edema.     Comments: Venous insuff with edema in ankles   Lymphadenopathy:     Cervical: No cervical adenopathy.  Skin:    General: Skin is warm and dry.     Coloration: Skin is not pale.     Findings: No erythema or rash.  Neurological:     Mental Status: She is alert. Mental status is at baseline.     Cranial Nerves: No cranial nerve deficit.     Motor: No abnormal muscle tone.     Coordination: Coordination normal.     Gait: Gait normal.     Deep Tendon Reflexes: Reflexes are normal and symmetric. Reflexes normal.  Psychiatric:        Mood and Affect: Mood normal.     Comments: Not actively hallucinating in office  Pleasant            Assessment & Plan:   Problem List Items Addressed This Visit      Cardiovascular and Mediastinum   HTN (hypertension)    bp in fair control at this time  BP Readings from Last 1 Encounters:  06/05/19 130/75   No changes needed Most recent labs reviewed  Disc lifstyle change with low sodium diet and exercise        Relevant Medications   losartan (COZAAR) 50 MG tablet   atorvastatin (LIPITOR) 10 MG tablet   furosemide (LASIX) 20 MG tablet   hydrochlorothiazide (MICROZIDE) 12.5 MG capsule   Other Relevant Orders   CBC with Differential/Platelet   Comprehensive metabolic panel   Lipid panel   TSH     Endocrine   Hyperlipidemia associated with type 2 diabetes mellitus ([place])    Due for lipids today  Diabetic On atorvastatin  Fair diet Disc goals for lipids and reasons to control them Rev last labs with pt Rev low sat fat diet in detail  Relevant Medications   losartan (COZAAR) 50 MG tablet   metFORMIN (GLUCOPHAGE) 500 MG tablet   atorvastatin (LIPITOR) 10 MG tablet   Diabetes type 2, controlled ([facility])    A1C today   Diet fair  Enc exercise  On arb and statin  Called for last DM eye exam report (? Recent)  Wt loss enc      Relevant Medications   losartan (COZAAR) 50 MG tablet   metFORMIN (GLUCOPHAGE) 500 MG tablet   atorvastatin (LIPITOR) 10 MG tablet   Other Relevant Orders   Hemoglobin A1c     Genitourinary   UTI (urinary tract infection)    Recent mild uti (colonization?) On macrobid No symptoms         Other   Morbid obesity ([facility])    Discussed how this problem influences overall health and the risks it imposes  Reviewed plan for weight loss with lower calorie diet (via better food choices and also portion control or program like weight watchers) and exercise building up to or more than 30 minutes 5 days per week including some aerobic activity         Relevant Medications   metFORMIN (GLUCOPHAGE) 500 MG tablet   Routine general medical examination at a health care facility - Primary    Reviewed health habits including diet and exercise and skin cancer prevention Reviewed appropriate screening tests for age  Also reviewed health mt list, fam hx and immunization status , as well as social and family history   See HPI amw reviewed  Sent for opthy note  Pt will schedule her own mammogram this mo Declines dexa Enc her to take her D/ Ca  Labs today  Continue neuro f/u       Visual hallucinations    Pt sees neurology ? If early lewy body dementia  On seroquel currently      Vitamin B12 deficiency    Due for labs -done Also B12 shot today      Relevant Orders   Vitamin B12    Other Visit Diagnoses    Need for influenza vaccination       Relevant Orders   Flu Vaccine QUAD High Dose(Fluad) (Completed)

## 2019-06-05 NOTE — Assessment & Plan Note (Signed)
A1C today  Diet fair  Enc exercise  On arb and statin  Called for last DM eye exam report (? Recent)  Wt loss enc

## 2019-06-05 NOTE — Assessment & Plan Note (Signed)
Pt sees neurology ? If early lewy body dementia  On seroquel currently

## 2019-06-05 NOTE — Assessment & Plan Note (Signed)
Due for lipids today  Diabetic On atorvastatin  Fair diet Disc goals for lipids and reasons to control them Rev last labs with pt Rev low sat fat diet in detail

## 2019-06-05 NOTE — Assessment & Plan Note (Signed)
Recent mild uti (colonization?) On macrobid No symptoms

## 2019-06-05 NOTE — Assessment & Plan Note (Signed)
Discussed how this problem influences overall health and the risks it imposes  Reviewed plan for weight loss with lower calorie diet (via better food choices and also portion control or program like weight watchers) and exercise building up to or more than 30 minutes 5 days per week including some aerobic activity    

## 2019-06-05 NOTE — Assessment & Plan Note (Signed)
bp in fair control at this time  BP Readings from Last 1 Encounters:  06/05/19 130/75   No changes needed Most recent labs reviewed  Disc lifstyle change with low sodium diet and exercise

## 2019-06-05 NOTE — Assessment & Plan Note (Signed)
Reviewed health habits including diet and exercise and skin cancer prevention Reviewed appropriate screening tests for age  Also reviewed health mt list, fam hx and immunization status , as well as social and family history   See HPI amw reviewed  Sent for opthy note  Pt will schedule her own mammogram this mo Declines dexa Enc her to take her D/ Ca  Labs today  Continue neuro f/u

## 2019-06-07 ENCOUNTER — Telehealth: Payer: Self-pay | Admitting: *Deleted

## 2019-06-07 MED ORDER — ATORVASTATIN CALCIUM 20 MG PO TABS: 20.0000 mg | ORAL_TABLET | Freq: Every day | ORAL | 3 refills | Status: DC

## 2019-06-07 NOTE — Telephone Encounter (Signed)
-----   Message from Tammi Sou, [place] sent at 06/07/2019  4:52 PM EDT ----- Called pt back advised her of all of Dr. Marliss Coots comments. Pt agrees to increase her atorvastatin to 20 mg daily (she will take 2 tabs of the 10mg  until she runs out). Please send new Rx to [facility]. Nurse visit for b12 scheduled in 8 weeks (08/08/19)

## 2019-06-07 NOTE — Telephone Encounter (Signed)
Left VM requesting pt to call the office back regarding lab results  

## 2019-06-18 ENCOUNTER — Encounter: Payer: Self-pay | Admitting: Emergency Medicine

## 2019-06-18 ENCOUNTER — Emergency Department: Payer: PPO

## 2019-06-18 ENCOUNTER — Other Ambulatory Visit: Payer: Self-pay

## 2019-06-18 ENCOUNTER — Observation Stay
Admission: EM | Admit: 2019-06-18 | Discharge: 2019-06-19 | Disposition: A | Discharge status: Home / Self Care | Payer: PPO | Attending: Internal Medicine | Admitting: Internal Medicine

## 2019-06-18 DIAGNOSIS — Z881 Allergy status to other antibiotic agents status: Secondary | ICD-10-CM | POA: Diagnosis not present

## 2019-06-18 DIAGNOSIS — Z79899 Other long term (current) drug therapy: Secondary | ICD-10-CM | POA: Diagnosis not present

## 2019-06-18 DIAGNOSIS — Z7984 Long term (current) use of oral hypoglycemic drugs: Secondary | ICD-10-CM | POA: Diagnosis not present

## 2019-06-18 DIAGNOSIS — Z20828 Contact with and (suspected) exposure to other viral communicable diseases: Secondary | ICD-10-CM | POA: Insufficient documentation

## 2019-06-18 DIAGNOSIS — E119 Type 2 diabetes mellitus without complications: Secondary | ICD-10-CM | POA: Insufficient documentation

## 2019-06-18 DIAGNOSIS — Z8249 Family history of ischemic heart disease and other diseases of the circulatory system: Secondary | ICD-10-CM | POA: Diagnosis not present

## 2019-06-18 DIAGNOSIS — Z03818 Encounter for observation for suspected exposure to other biological agents ruled out: Secondary | ICD-10-CM | POA: Diagnosis not present

## 2019-06-18 DIAGNOSIS — R0781 Pleurodynia: Secondary | ICD-10-CM | POA: Diagnosis not present

## 2019-06-18 DIAGNOSIS — I10 Essential (primary) hypertension: Secondary | ICD-10-CM | POA: Diagnosis not present

## 2019-06-18 DIAGNOSIS — W07XXXA Fall from chair, initial encounter: Secondary | ICD-10-CM | POA: Insufficient documentation

## 2019-06-18 DIAGNOSIS — Z66 Do not resuscitate: Secondary | ICD-10-CM | POA: Diagnosis not present

## 2019-06-18 DIAGNOSIS — I119 Hypertensive heart disease without heart failure: Secondary | ICD-10-CM | POA: Diagnosis not present

## 2019-06-18 DIAGNOSIS — I499 Cardiac arrhythmia, unspecified: Secondary | ICD-10-CM | POA: Insufficient documentation

## 2019-06-18 DIAGNOSIS — R079 Chest pain, unspecified: Secondary | ICD-10-CM | POA: Diagnosis not present

## 2019-06-18 DIAGNOSIS — S199XXA Unspecified injury of neck, initial encounter: Secondary | ICD-10-CM | POA: Diagnosis not present

## 2019-06-18 DIAGNOSIS — I081 Rheumatic disorders of both mitral and tricuspid valves: Secondary | ICD-10-CM | POA: Insufficient documentation

## 2019-06-18 DIAGNOSIS — S0990XA Unspecified injury of head, initial encounter: Secondary | ICD-10-CM | POA: Diagnosis not present

## 2019-06-18 DIAGNOSIS — M6282 Rhabdomyolysis: Secondary | ICD-10-CM | POA: Insufficient documentation

## 2019-06-18 DIAGNOSIS — E785 Hyperlipidemia, unspecified: Secondary | ICD-10-CM | POA: Insufficient documentation

## 2019-06-18 DIAGNOSIS — I959 Hypotension, unspecified: Secondary | ICD-10-CM | POA: Diagnosis not present

## 2019-06-18 DIAGNOSIS — E876 Hypokalemia: Secondary | ICD-10-CM | POA: Insufficient documentation

## 2019-06-18 DIAGNOSIS — Z880 Allergy status to penicillin: Secondary | ICD-10-CM | POA: Insufficient documentation

## 2019-06-18 DIAGNOSIS — F419 Anxiety disorder, unspecified: Secondary | ICD-10-CM | POA: Diagnosis not present

## 2019-06-18 DIAGNOSIS — Z6841 Body Mass Index (BMI) 40.0 and over, adult: Secondary | ICD-10-CM | POA: Insufficient documentation

## 2019-06-18 DIAGNOSIS — S299XXA Unspecified injury of thorax, initial encounter: Secondary | ICD-10-CM | POA: Diagnosis not present

## 2019-06-18 DIAGNOSIS — R55 Syncope and collapse: Secondary | ICD-10-CM | POA: Diagnosis not present

## 2019-06-18 LAB — CBC WITH DIFFERENTIAL/PLATELET
Abs Immature Granulocytes: 0.05 10*3/uL (ref 0.00–0.07)
Basophils Absolute: 0.1 10*3/uL (ref 0.0–0.1)
Basophils Relative: 1 %
Eosinophils Absolute: 0.4 10*3/uL (ref 0.0–0.5)
Eosinophils Relative: 4 %
HCT: 36.1 % (ref 36.0–46.0)
Hemoglobin: 11.4 g/dL — ABNORMAL LOW (ref 12.0–15.0)
Immature Granulocytes: 1 %
Lymphocytes Relative: 13 %
Lymphs Abs: 1.2 10*3/uL (ref 0.7–4.0)
MCH: 25.7 pg — ABNORMAL LOW (ref 26.0–34.0)
MCHC: 31.6 g/dL (ref 30.0–36.0)
MCV: 81.5 fL (ref 80.0–100.0)
Monocytes Absolute: 0.6 10*3/uL (ref 0.1–1.0)
Monocytes Relative: 7 %
Neutro Abs: 6.9 10*3/uL (ref 1.7–7.7)
Neutrophils Relative %: 74 %
Platelets: 319 10*3/uL (ref 150–400)
RBC: 4.43 MIL/uL (ref 3.87–5.11)
RDW: 15.6 % — ABNORMAL HIGH (ref 11.5–15.5)
WBC: 9.2 10*3/uL (ref 4.0–10.5)
nRBC: 0 % (ref 0.0–0.2)

## 2019-06-18 LAB — CK: Total CK: 656 U/L — ABNORMAL HIGH (ref 38–234)

## 2019-06-18 LAB — COMPREHENSIVE METABOLIC PANEL
ALT: 24 U/L (ref 0–44)
AST: 40 U/L (ref 15–41)
Albumin: 3.8 g/dL (ref 3.5–5.0)
Alkaline Phosphatase: 89 U/L (ref 38–126)
Anion gap: 10 (ref 5–15)
BUN: 17 mg/dL (ref 8–23)
CO2: 28 mmol/L (ref 22–32)
Calcium: 8.8 mg/dL — ABNORMAL LOW (ref 8.9–10.3)
Chloride: 103 mmol/L (ref 98–111)
Creatinine, Ser: 0.84 mg/dL (ref 0.44–1.00)
GFR calc Af Amer: >60 mL/min (ref >60)
GFR calc non Af Amer: >60 mL/min (ref >60)
Glucose, Bld: 147 mg/dL — ABNORMAL HIGH (ref 70–99)
Potassium: 3.3 mmol/L — ABNORMAL LOW (ref 3.5–5.1)
Sodium: 141 mmol/L (ref 135–145)
Total Bilirubin: 1 mg/dL (ref 0.3–1.2)
Total Protein: 6.7 g/dL (ref 6.5–8.1)

## 2019-06-18 LAB — URINALYSIS, COMPLETE (UACMP) WITH MICROSCOPIC
Bacteria, UA: NONE SEEN
Bilirubin Urine: NEGATIVE
Glucose, UA: NEGATIVE mg/dL
Hgb urine dipstick: NEGATIVE
Ketones, ur: NEGATIVE mg/dL
Nitrite: NEGATIVE
Protein, ur: NEGATIVE mg/dL
Specific Gravity, Urine: 1.023 (ref 1.005–1.030)
pH: 5 (ref 5.0–8.0)

## 2019-06-18 LAB — MAGNESIUM: Magnesium: 2.2 mg/dL (ref 1.7–2.4)

## 2019-06-18 LAB — TROPONIN I (HIGH SENSITIVITY)
Troponin I (High Sensitivity): 3 ng/L (ref <18)
Troponin I (High Sensitivity): <2 ng/L (ref <18)

## 2019-06-18 MED ORDER — ACETAMINOPHEN 650 MG RE SUPP: 650.0000 mg | Freq: Four times a day (QID) | RECTAL | Status: DC | PRN

## 2019-06-18 MED ORDER — SODIUM CHLORIDE 0.9 % IV SOLN
INTRAVENOUS | Status: DC
  Administered 2019-06-18: 22:00:00 via INTRAVENOUS

## 2019-06-18 MED ORDER — LOSARTAN POTASSIUM 50 MG PO TABS
50.0000 mg | ORAL_TABLET | Freq: Every day | ORAL | Status: DC
  Administered 2019-06-18: 50 mg via ORAL
  Filled 2019-06-18: qty 1

## 2019-06-18 MED ORDER — ACETAMINOPHEN 325 MG PO TABS: 650.0000 mg | ORAL_TABLET | Freq: Four times a day (QID) | ORAL | Status: DC | PRN

## 2019-06-18 MED ORDER — SODIUM CHLORIDE 0.9 % IV BOLUS
500.0000 mL | Freq: Once | INTRAVENOUS | Status: AC
  Administered 2019-06-18: 500 mL via INTRAVENOUS

## 2019-06-18 MED ORDER — BISACODYL 5 MG PO TBEC: 5.0000 mg | DELAYED_RELEASE_TABLET | Freq: Every day | ORAL | Status: DC | PRN

## 2019-06-18 MED ORDER — ALBUTEROL SULFATE (2.5 MG/3ML) 0.083% IN NEBU: 2.5000 mg | INHALATION_SOLUTION | RESPIRATORY_TRACT | Status: DC | PRN

## 2019-06-18 MED ORDER — HYDROCODONE-ACETAMINOPHEN 5-325 MG PO TABS: 1.0000 | ORAL_TABLET | ORAL | Status: DC | PRN

## 2019-06-18 MED ORDER — POTASSIUM CHLORIDE CRYS ER 20 MEQ PO TBCR
40.0000 meq | EXTENDED_RELEASE_TABLET | Freq: Once | ORAL | Status: AC
  Administered 2019-06-18: 40 meq via ORAL
  Filled 2019-06-18: qty 2

## 2019-06-18 MED ORDER — ENOXAPARIN SODIUM 40 MG/0.4ML ~~LOC~~ SOLN
40.0000 mg | SUBCUTANEOUS | Status: DC
  Filled 2019-06-18: qty 0.4

## 2019-06-18 MED ORDER — ATORVASTATIN CALCIUM 20 MG PO TABS
20.0000 mg | ORAL_TABLET | Freq: Every day | ORAL | Status: DC
  Administered 2019-06-18 – 2019-06-19 (×2): 20 mg via ORAL
  Filled 2019-06-18 (×2): qty 1

## 2019-06-18 MED ORDER — ONDANSETRON HCL 4 MG/2ML IJ SOLN: 4.0000 mg | Freq: Four times a day (QID) | INTRAMUSCULAR | Status: DC | PRN

## 2019-06-18 MED ORDER — ONDANSETRON HCL 4 MG PO TABS: 4.0000 mg | ORAL_TABLET | Freq: Four times a day (QID) | ORAL | Status: DC | PRN

## 2019-06-18 MED ORDER — SENNOSIDES-DOCUSATE SODIUM 8.6-50 MG PO TABS: 1.0000 | ORAL_TABLET | Freq: Every evening | ORAL | Status: DC | PRN

## 2019-06-18 MED ORDER — ALPRAZOLAM 0.5 MG PO TABS: 0.5000 mg | ORAL_TABLET | Freq: Every evening | ORAL | Status: DC | PRN

## 2019-06-18 NOTE — Progress Notes (Signed)
Advanced Care Plan.  Purpose of Encounter: CODE STATUS. Parties in Attendance: The patient, her son and me. Patient's Decisional Capacity: Yes. Medical Story: Hannah Duran  is a 85 y.o. female with a known history of hypertension, hyperlipidemia, irregular heart rhythm, memory loss, anxiety, ischemic colitis etc. she is being admitted for syncope episodes.  I discussed with the patient about her current condition, prognosis and CODE STATUS.  The patient does not want to be resuscitated and intubated if she has cardiopulmonary arrest. Plan:  Code Status: DNR. Time spent discussing advance care planning: 17 minutes.

## 2019-06-18 NOTE — H&P (Signed)
[facility] [place] NAME: Hannah Duran    MR#:  [id number]  DATE OF BIRTH:  Jan 23, 1934  DATE OF ADMISSION:  06/18/2019  PRIMARY CARE PHYSICIAN: Tower, Wynelle Fanny, MD   REQUESTING/REFERRING PHYSICIAN: Dr. Quentin Cornwall.  CHIEF COMPLAINT:   Chief Complaint  Patient presents with   Fall   Syncope today. HISTORY OF PRESENT ILLNESS:  Hannah Duran  is a 85 y.o. female with a known history of hypertension, hyperlipidemia, irregular heart rhythm, memory loss, anxiety, ischemic colitis etc.  The patient presents the ED with above chief complaints.  The patient passed out from chair to the ground for 6 hours today and yesterday.  She denies any symptoms before and after syncope episodes. She said she was started water pill for leg swelling 1 week ago.  She is found mild elevated CK.  CAT scan of the head is unremarkable.  CT angiogram of chest is unremarkable.  Dr. Quentin Cornwall request admission for syncope work-up. PAST MEDICAL HISTORY:   Past Medical History:  Diagnosis Date   Anxiety    Congenital foot deformity    Dermatophytosis    Hallucinations    HLD (hyperlipidemia)    HTN (hypertension)    Hyperglycemia    Irregular heart rhythm    pt unsure of what type; followed by PCP only   Ischemic colitis ([place]) 7/13   on colonoscopy   Memory loss    Obesity    Rotator cuff syndrome     PAST SURGICAL HISTORY:   Past Surgical History:  Procedure Laterality Date   CATARACT EXTRACTION W/PHACO Right 09/09/2016   Procedure: CATARACT EXTRACTION PHACO AND INTRAOCULAR LENS PLACEMENT ([place]);  Surgeon: Leandrew Koyanagi, MD;  Location: [facility];  Service: Ophthalmology;  Laterality: Right;  right   CATARACT EXTRACTION W/PHACO Left 12/16/2016   Procedure: CATARACT EXTRACTION PHACO AND INTRAOCULAR LENS PLACEMENT ([place])  Left;  Surgeon: Leandrew Koyanagi, MD;  Location: [facility];  Service: Ophthalmology;  Laterality: Left;     HUMERUS FRACTURE SURGERY  10/1997   TOTAL ABDOMINAL HYSTERECTOMY  1980's   due to menometrorrhagia    SOCIAL HISTORY:   Social History   Tobacco Use   Smoking status: Never Smoker   Smokeless tobacco: Never Used  Substance Use Topics   Alcohol use: No    FAMILY HISTORY:   Family History  Problem Relation Age of Onset   Stroke Mother    Parkinsonism Mother    Hypertension Mother    Alzheimer's disease Mother    Cancer Father        bladder/prostate   Breast cancer Neg Hx     DRUG ALLERGIES:   Allergies  Allergen Reactions   Penicillins     REACTION: Rash   Sulfamethoxazole-Trimethoprim     rash   Amoxicillin Rash    REVIEW OF SYSTEMS:   Review of Systems  Constitutional: Negative for chills, fever and malaise/fatigue.  HENT: Negative for sore throat.   Eyes: Negative for blurred vision and double vision.  Respiratory: Negative for cough, hemoptysis, shortness of breath, wheezing and stridor.   Cardiovascular: Negative for chest pain, palpitations, orthopnea and leg swelling.  Gastrointestinal: Negative for abdominal pain, blood in stool, diarrhea, melena, nausea and vomiting.  Genitourinary: Positive for frequency. Negative for dysuria, flank pain, hematuria and urgency.  Musculoskeletal: Negative for back pain and joint pain.  Skin: Negative for rash.  Neurological: Positive for loss of consciousness. Negative for dizziness, sensory change, speech  change, focal weakness, seizures, weakness and headaches.  Endo/Heme/Allergies: Negative for polydipsia.  Psychiatric/Behavioral: Negative for depression. The patient is not nervous/anxious.     MEDICATIONS AT HOME:   Prior to Admission medications   Medication Sig Start Date End Date Taking? Authorizing Provider  ALPRAZolam Duanne Moron) 0.5 MG tablet Take 1 tablet (0.5 mg total) by mouth at bedtime as needed for anxiety. 05/25/19   Marcial Pacas, MD  atorvastatin (LIPITOR) 20 MG tablet Take 1 tablet (20  mg total) by mouth daily. 06/07/19   Tower, Wynelle Fanny, MD  cyanocobalamin (,VITAMIN B-12,) 1000 MCG/ML injection Inject 1,000 mcg into the muscle every 8 (eight) weeks.     Tower, Wynelle Fanny, MD  furosemide (LASIX) 20 MG tablet Take 1 tablet (20 mg total) by mouth daily. 06/05/19   Tower, Wynelle Fanny, MD  hydrochlorothiazide (MICROZIDE) 12.5 MG capsule Take 1 capsule (12.5 mg total) by mouth daily. 06/05/19   Tower, Wynelle Fanny, MD  losartan (COZAAR) 50 MG tablet Take 1 tablet (50 mg total) by mouth daily. 06/05/19   Tower, Wynelle Fanny, MD  metFORMIN (GLUCOPHAGE) 500 MG tablet Take 1 tablet (500 mg total) by mouth 2 (two) times daily with a meal. 06/05/19   Tower, Wynelle Fanny, MD  nitrofurantoin, macrocrystal-monohydrate, (MACROBID) 100 MG capsule Take one capsule BID x 5 days. 05/29/19   Marcial Pacas, MD  QUEtiapine (SEROQUEL) 25 MG tablet Take 2 tablets (50 mg total) by mouth at bedtime. 03/08/19   Marcial Pacas, MD      VITAL SIGNS:  Blood pressure 127/60, pulse 79, temperature 98.8 F (37.1 C), temperature source Oral, resp. rate 16, height 5\' 5"  (1.651 m), weight 114.8 kg, last menstrual period 08/31/1980, SpO2 99 %.  PHYSICAL EXAMINATION:  Physical Exam  GENERAL:  85 y.o.-year-old patient lying in the bed with no acute distress.  EYES: Pupils equal, round, reactive to light and accommodation. No scleral icterus. Extraocular muscles intact.  HEENT: Head atraumatic, normocephalic. NECK:  Supple, no jugular venous distention. No thyroid enlargement, no tenderness.  LUNGS: Normal breath sounds bilaterally, no wheezing, rales,rhonchi or crepitation. No use of accessory muscles of respiration.  CARDIOVASCULAR: S1, S2 normal. No murmurs, rubs, or gallops.  ABDOMEN: Soft, nontender, nondistended. Bowel sounds present. No organomegaly or mass.  EXTREMITIES: No pedal edema, cyanosis, or clubbing.  NEUROLOGIC: Cranial nerves II through XII are intact. Muscle strength 5/5 in all extremities. Sensation intact. Gait not checked.    PSYCHIATRIC: The patient is alert and oriented x 3.  SKIN: No obvious rash, lesion, or ulcer.   LABORATORY PANEL:   CBC Recent Labs  Lab 06/18/19 1345  WBC 9.2  HGB 11.4*  HCT 36.1  PLT 319   ------------------------------------------------------------------------------------------------------------------  Chemistries  Recent Labs  Lab 06/18/19 1345  NA 141  K 3.3*  CL 103  CO2 28  GLUCOSE 147*  BUN 17  CREATININE 0.84  CALCIUM 8.8*  AST 40  ALT 24  ALKPHOS 89  BILITOT 1.0   ------------------------------------------------------------------------------------------------------------------  Cardiac Enzymes No results for input(s): TROPONINI in the last 168 hours. ------------------------------------------------------------------------------------------------------------------  RADIOLOGY:  Dg Chest 2 View  Result Date: 06/18/2019 CLINICAL DATA:  Acute LEFT chest and rib pain following fall. Initial encounter. EXAM: CHEST - 2 VIEW COMPARISON:  None. FINDINGS: Mild cardiomegaly is noted. Mild LEFT basilar atelectasis versus scarring is noted. There is no evidence of focal airspace disease, pulmonary edema, suspicious pulmonary nodule/mass, pleural effusion, or pneumothorax. No acute bony abnormalities are identified. IMPRESSION: 1. Mild LEFT basilar atelectasis/scarring.  2. Mild cardiomegaly. Electronically Signed   By: Margarette Canada M.D.   On: 06/18/2019 14:38   Ct Head Wo Contrast  Result Date: 06/18/2019 CLINICAL DATA:  Status post fall EXAM: CT HEAD WITHOUT CONTRAST TECHNIQUE: Contiguous axial images were obtained from the base of the skull through the vertex without intravenous contrast. COMPARISON:  MRI brain dated 03/16/2019. CT head dated 07/25/2018. FINDINGS: Brain: No evidence of acute infarction, hemorrhage, hydrocephalus, extra-axial collection or mass lesion/mass effect. Subcortical white matter and periventricular small vessel ischemic changes. Right frontal  meningioma (series 4/image 13), benign. Vascular: Intracranial atherosclerosis. Skull: Normal. Negative for fracture or focal lesion. Sinuses/Orbits: The visualized paranasal sinuses are essentially clear. The mastoid air cells are unopacified. Other: None. IMPRESSION: No evidence of acute intracranial abnormality. Small vessel ischemic changes.  Right frontal meningioma, benign. Electronically Signed   By: Julian Hy M.D.   On: 06/18/2019 15:45   Ct Chest Wo Contrast  Result Date: 06/18/2019 CLINICAL DATA:  85 year old female with trauma and concern for rib fracture. Patient is having pain in the left side of the chest. EXAM: CT CHEST WITHOUT CONTRAST TECHNIQUE: Multidetector CT imaging of the chest was performed following the standard protocol without IV contrast. COMPARISON:  Chest radiograph dated 06/18/2019 FINDINGS: Evaluation of this exam is limited in the absence of intravenous contrast. Cardiovascular: Borderline cardiomegaly. No pericardial effusion. Coronary vascular calcifications noted. Mild atherosclerotic calcification of the thoracic aorta. The central pulmonary arteries are grossly unremarkable on this noncontrast CT. Mediastinum/Nodes: No hilar or mediastinal adenopathy. Small hiatal hernia. The esophagus and the thyroid gland are grossly unremarkable. No mediastinal fluid collection. Lungs/Pleura: There is a 4 mm left lower lobe subpleural nodule (series 3 image 91). The lungs are otherwise clear. There is no pleural effusion or pneumothorax. The central airways are patent. Upper Abdomen: Bilateral renal hypodense lesions which are not characterized on this non-contrast CT and new or increased in size since the CT angiogram of 01/22/2012. Further evaluation with ultrasound on a nonemergent basis recommended. There is scattered colonic diverticula. The visualized upper abdomen is otherwise unremarkable. Musculoskeletal: Degenerative changes of the spine. Sclerotic area in the T8  vertebra, indeterminate, likely benign. Correlation with history of primary no known malignancy recommended. No acute osseous pathology. No displaced rib fractures. IMPRESSION: 1. No acute/traumatic intrathoracic pathology. No displaced rib fractures. 2. Aortic Atherosclerosis (ICD10-I70.0). Electronically Signed   By: Anner Crete M.D.   On: 06/18/2019 15:54   Ct Cervical Spine Wo Contrast  Result Date: 06/18/2019 CLINICAL DATA:  85 year old female with history of trauma from a fall. Evaluate for cervical spine fracture. EXAM: CT CERVICAL SPINE WITHOUT CONTRAST TECHNIQUE: Multidetector CT imaging of the cervical spine was performed without intravenous contrast. Multiplanar CT image reconstructions were also generated. COMPARISON:  No priors. FINDINGS: Alignment: Reversal of normal cervical lordosis centered at the level of C4, likely chronic and degenerative. Alignment is otherwise anatomic. Skull base and vertebrae: No acute fracture. No primary bone lesion or focal pathologic process. Soft tissues and spinal canal: No prevertebral fluid or swelling. No visible canal hematoma. Disc levels: Multilevel degenerative disc disease, most pronounced at C4-C5, C5-C6 and C6-C7. Moderate multilevel facet arthropathy. Upper chest: Negative. Other: None. IMPRESSION: 1. No evidence of significant acute traumatic injury to the cervical spine. 2. Multilevel degenerative disc disease and cervical spondylosis, as above. Electronically Signed   By: Vinnie Langton M.D.   On: 06/18/2019 14:27      IMPRESSION AND PLAN:   Syncope. The patient will be  placed on observation. Continue telemetry monitor, check orthostatic vital sign, echocardiograph and cardiology consult.  [place] Dr. Ubaldo Glassing. Mild rhabdomyolysis.  IV fluid support and follow-up CK. Hypokalemia.  Potassium supplement. Hypertension.  Continue home hypertension medication but hold diuretics.  All the records are reviewed and case discussed with ED  provider. Management plans discussed with the patient, her son and they are in agreement.  CODE STATUS:   TOTAL TIME TAKING CARE OF THIS PATIENT: 48 minutes.    Demetrios Loll M.D on 06/18/2019 at 4:58 PM  Between 7am to 6pm - Pager - [phone]  After 6pm go to [email or web address] - [employer]wer [facility]  Office  [phone]  CC: Primary care physician; Tower, Wynelle Fanny, MD   Note: This dictation was prepared with Dragon dictation along with smaller phrase technology. Any transcriptional errors that result from this process are unin

## 2019-06-18 NOTE — ED Notes (Signed)
Pt walked to bathroom at this time w/o difficulty.

## 2019-06-18 NOTE — ED Notes (Addendum)
First call report att, Merry Proud reports Diandra,RN unaware, number given for RN to call me in the next 5-10 minutes

## 2019-06-18 NOTE — ED Triage Notes (Signed)
Pt to ED via POV, pt states that she fell on Friday, pt states that she fell out of the kitchen chair. Pt reports that she was unable to get up out of the floor and was in the floor for 5-6 hours. Pt reports that she is having pain all over. Pt reports being very sore. Pt states that she is having pain in her left rib cage area. Pt is in NAD at this time.

## 2019-06-18 NOTE — ED Notes (Signed)
ED TO INPATIENT HANDOFF REPORT  ED Nurse Name and Phone #:  Jaimin Krupka [phone]  S Name/Age/Gender Hannah Duran 85 y.o. female Room/Bed: ED13A/ED13A  Code Status   Code Status: Not on file  Home/SNF/Other Home Patient oriented to: self, place, time and situation Is this baseline? Yes   Triage Complete: Triage complete  Chief Complaint Fall  Triage Note Pt to ED via POV, pt states that she fell on Friday, pt states that she fell out of the kitchen chair. Pt reports that she was unable to get up out of the floor and was in the floor for 5-6 hours. Pt reports that she is having pain all over. Pt reports being very sore. Pt states that she is having pain in her left rib cage area. Pt is in NAD at this time.    Allergies Allergies  Allergen Reactions  . Penicillins     REACTION: Rash  . Sulfamethoxazole-Trimethoprim     rash  . Amoxicillin Rash    Level of Care/Admitting Diagnosis ED Disposition    ED Disposition Condition [facility] Area: [place] [100120]  Level of Care: Telemetry [5]  Covid Evaluation: Asymptomatic Screening Protocol (No Symptoms)  Diagnosis: Syncope [id number]  Admitting Physician: Demetrios Loll [id number]  Attending Physician: Demetrios Loll [phone]  PT Class (Do Not Modify): Observation [104]  PT Acc Code (Do Not Modify): Observation [10022]       B Medical/Surgery History Past Medical History:  Diagnosis Date  . Anxiety   . Congenital foot deformity   . Dermatophytosis   . Hallucinations   . HLD (hyperlipidemia)   . HTN (hypertension)   . Hyperglycemia   . Irregular heart rhythm    pt unsure of what type; followed by PCP only  . Ischemic colitis ([place]) 7/13   on colonoscopy  . Memory loss   . Obesity   . Rotator cuff syndrome    Past Surgical History:  Procedure Laterality Date  . CATARACT EXTRACTION W/PHACO Right 09/09/2016   Procedure: CATARACT EXTRACTION PHACO AND INTRAOCULAR LENS PLACEMENT (IOC);   Surgeon: Leandrew Koyanagi, MD;  Location: [place];  Service: Ophthalmology;  Laterality: Right;  right  . CATARACT EXTRACTION W/PHACO Left 12/16/2016   Procedure: CATARACT EXTRACTION PHACO AND INTRAOCULAR LENS PLACEMENT ([place])  Left;  Surgeon: Leandrew Koyanagi, MD;  Location: [place];  Service: Ophthalmology;  Laterality: Left;  . HUMERUS FRACTURE SURGERY  10/1997  . TOTAL ABDOMINAL HYSTERECTOMY  1980's   due to menometrorrhagia     A IV Location/Drains/Wounds Patient Lines/Drains/Airways Status   Active Line/Drains/Airways    Name:   Placement date:   Placement time:   Site:   Days:   Peripheral IV 06/18/19 Right Antecubital   06/18/19    1712    Antecubital   less than 1   Incision (Closed) 09/09/16 Eye Right   09/09/16    0902     1012   Incision (Closed) 12/16/16 Eye Left   12/16/16    0955     914          Intake/Output Last 24 hours  Intake/Output Summary (Last 24 hours) at 06/18/2019 2039 Last data filed at 06/18/2019 2009 Gross per 24 hour  Intake 500 ml  Output -  Net 500 ml    Labs/Imaging Results for orders placed or performed during the hospital encounter of 06/18/19 (from the past 48 hour(s))  CBC with Differential     Status:  Abnormal   Collection Time: 06/18/19  1:45 PM  Result Value Ref Range   WBC 9.2 4.0 - 10.5 K/uL   RBC 4.43 3.87 - 5.11 MIL/uL   Hemoglobin 11.4 (L) 12.0 - 15.0 g/dL   HCT 36.1 36.0 - 46.0 %   MCV 81.5 80.0 - 100.0 fL   MCH 25.7 (L) 26.0 - 34.0 pg   MCHC 31.6 30.0 - 36.0 g/dL   RDW 15.6 (H) 11.5 - 15.5 %   Platelets 319 150 - 400 K/uL   nRBC 0.0 0.0 - 0.2 %   Neutrophils Relative % 74 %   Neutro Abs 6.9 1.7 - 7.7 K/uL   Lymphocytes Relative 13 %   Lymphs Abs 1.2 0.7 - 4.0 K/uL   Monocytes Relative 7 %   Monocytes Absolute 0.6 0.1 - 1.0 K/uL   Eosinophils Relative 4 %   Eosinophils Absolute 0.4 0.0 - 0.5 K/uL   Basophils Relative 1 %   Basophils Absolute 0.1 0.0 - 0.1 K/uL   Immature Granulocytes 1 %    Abs Immature Granulocytes 0.05 0.00 - 0.07 K/uL    Comment: Performed at [facility], [address] [place], [place] [address]  Comprehensive metabolic panel     Status: Abnormal   Collection Time: 06/18/19  1:45 PM  Result Value Ref Range   Sodium 141 135 - 145 mmol/L   Potassium 3.3 (L) 3.5 - 5.1 mmol/L   Chloride 103 98 - 111 mmol/L   CO2 28 22 - 32 mmol/L   Glucose, Bld 147 (H) 70 - 99 mg/dL   BUN 17 8 - 23 mg/dL   Creatinine, Ser 0.84 0.44 - 1.00 mg/dL   Calcium 8.8 (L) 8.9 - 10.3 mg/dL   Total Protein 6.7 6.5 - 8.1 g/dL   Albumin 3.8 3.5 - 5.0 g/dL   AST 40 15 - 41 U/L   ALT 24 0 - 44 U/L   Alkaline Phosphatase 89 38 - 126 U/L   Total Bilirubin 1.0 0.3 - 1.2 mg/dL   GFR calc non Af Amer >60 >60 mL/min   GFR calc Af Amer >60 >60 mL/min   Anion gap 10 5 - 15    Comment: Performed at [facility], [address] [place], [place] [address]  CK     Status: Abnormal   Collection Time: 06/18/19  1:45 PM  Result Value Ref Range   Total CK 656 (H) 38 - 234 U/L    Comment: Performed at [facility], [place] [place], [place] [address]  Troponin I (High Sensitivity)     Status: None   Collection Time: 06/18/19  1:45 PM  Result Value Ref Range   Troponin I (High Sensitivity) 3 <18 ng/L    Comment: (NOTE) Elevated high sensitivity troponin I (hsTnI) values and significant  changes across serial measurements may suggest ACS but many other  chronic and acute conditions are known to elevate hsTnI results.  Refer to the "Links" section for chest pain algorithms and additional  guidance. Performed at [facility], [address] [place], [place] [address]   Urinalysis, Complete w Microscopic     Status: Abnormal   Collection Time: 06/18/19  5:19 PM  Result Value Ref Range   Color, Urine YELLOW (A) YELLOW   APPearance HAZY (A) CLEAR   Specific Gravity, Urine 1.023 1.005 - 1.030   pH 5.0 5.0 - 8.0   Glucose, UA NEGATIVE NEGATIVE  mg/dL   Hgb urine  dipstick NEGATIVE NEGATIVE   Bilirubin Urine NEGATIVE NEGATIVE   Ketones, ur NEGATIVE NEGATIVE mg/dL   Protein, ur NEGATIVE NEGATIVE mg/dL   Nitrite NEGATIVE NEGATIVE   Leukocytes,Ua TRACE (A) NEGATIVE   RBC / HPF 0-5 0 - 5 RBC/hpf   WBC, UA 6-10 0 - 5 WBC/hpf   Bacteria, UA NONE SEEN NONE SEEN   Squamous Epithelial / LPF 0-5 0 - 5   Mucus PRESENT     Comment: Performed at [facility], [place], [place] [address]  Troponin I (High Sensitivity)     Status: None   Collection Time: 06/18/19  5:19 PM  Result Value Ref Range   Troponin I (High Sensitivity) <2 <18 ng/L    Comment: (NOTE) Elevated high sensitivity troponin I (hsTnI) values and significant  changes across serial measurements may suggest ACS but many other  chronic and acute conditions are known to elevate hsTnI results.  Refer to the "Links" section for chest pain algorithms and additional  guidance. Performed at [facility], [address] [place], [place] [address]    Dg Chest 2 View  Result Date: 06/18/2019 CLINICAL DATA:  Acute LEFT chest and rib pain following fall. Initial encounter. EXAM: CHEST - 2 VIEW COMPARISON:  None. FINDINGS: Mild cardiomegaly is noted. Mild LEFT basilar atelectasis versus scarring is noted. There is no evidence of focal airspace disease, pulmonary edema, suspicious pulmonary nodule/mass, pleural effusion, or pneumothorax. No acute bony abnormalities are identified. IMPRESSION: 1. Mild LEFT basilar atelectasis/scarring. 2. Mild cardiomegaly. Electronically Signed   By: Margarette Canada M.D.   On: 06/18/2019 14:38   Ct Head Wo Contrast  Result Date: 06/18/2019 CLINICAL DATA:  Status post fall EXAM: CT HEAD WITHOUT CONTRAST TECHNIQUE: Contiguous axial images were obtained from the base of the skull through the vertex without intravenous contrast. COMPARISON:  MRI brain dated 03/16/2019. CT head dated 07/25/2018. FINDINGS: Brain: No evidence of  acute infarction, hemorrhage, hydrocephalus, extra-axial collection or mass lesion/mass effect. Subcortical white matter and periventricular small vessel ischemic changes. Right frontal meningioma (series 4/image 13), benign. Vascular: Intracranial atherosclerosis. Skull: Normal. Negative for fracture or focal lesion. Sinuses/Orbits: The visualized paranasal sinuses are essentially clear. The mastoid air cells are unopacified. Other: None. IMPRESSION: No evidence of acute intracranial abnormality. Small vessel ischemic changes.  Right frontal meningioma, benign. Electronically Signed   By: Julian Hy M.D.   On: 06/18/2019 15:45   Ct Chest Wo Contrast  Result Date: 06/18/2019 CLINICAL DATA:  85 year old female with trauma and concern for rib fracture. Patient is having pain in the left side of the chest. EXAM: CT CHEST WITHOUT CONTRAST TECHNIQUE: Multidetector CT imaging of the chest was performed following the standard protocol without IV contrast. COMPARISON:  Chest radiograph dated 06/18/2019 FINDINGS: Evaluation of this exam is limited in the absence of intravenous contrast. Cardiovascular: Borderline cardiomegaly. No pericardial effusion. Coronary vascular calcifications noted. Mild atherosclerotic calcification of the thoracic aorta. The central pulmonary arteries are grossly unremarkable on this noncontrast CT. Mediastinum/Nodes: No hilar or mediastinal adenopathy. Small hiatal hernia. The esophagus and the thyroid gland are grossly unremarkable. No mediastinal fluid collection. Lungs/Pleura: There is a 4 mm left lower lobe subpleural nodule (series 3 image 91). The lungs are otherwise clear. There is no pleural effusion or pneumothorax. The central airways are patent. Upper Abdomen: Bilateral renal hypodense lesions which are not characterized on this non-contrast CT and new or increased in size since the CT angiogram of 01/22/2012. Further evaluation with  ultrasound on a nonemergent basis  recommended. There is scattered colonic diverticula. The visualized upper abdomen is otherwise unremarkable. Musculoskeletal: Degenerative changes of the spine. Sclerotic area in the T8 vertebra, indeterminate, likely benign. Correlation with history of primary no known malignancy recommended. No acute osseous pathology. No displaced rib fractures. IMPRESSION: 1. No acute/traumatic intrathoracic pathology. No displaced rib fractures. 2. Aortic Atherosclerosis (ICD10-I70.0). Electronically Signed   By: Anner Crete M.D.   On: 06/18/2019 15:54   Ct Cervical Spine Wo Contrast  Result Date: 06/18/2019 CLINICAL DATA:  85 year old female with history of trauma from a fall. Evaluate for cervical spine fracture. EXAM: CT CERVICAL SPINE WITHOUT CONTRAST TECHNIQUE: Multidetector CT imaging of the cervical spine was performed without intravenous contrast. Multiplanar CT image reconstructions were also generated. COMPARISON:  No priors. FINDINGS: Alignment: Reversal of normal cervical lordosis centered at the level of C4, likely chronic and degenerative. Alignment is otherwise anatomic. Skull base and vertebrae: No acute fracture. No primary bone lesion or focal pathologic process. Soft tissues and spinal canal: No prevertebral fluid or swelling. No visible canal hematoma. Disc levels: Multilevel degenerative disc disease, most pronounced at C4-C5, C5-C6 and C6-C7. Moderate multilevel facet arthropathy. Upper chest: Negative. Other: None. IMPRESSION: 1. No evidence of significant acute traumatic injury to the cervical spine. 2. Multilevel degenerative disc disease and cervical spondylosis, as above. Electronically Signed   By: Vinnie Langton M.D.   On: 06/18/2019 14:27    Pending Labs Unresulted Labs (From admission, onward)    Start     Ordered   06/18/19 1655  SARS CORONAVIRUS 2 (TAT 6-24 HRS) Nasopharyngeal Nasopharyngeal Swab  (Symptomatic/High Risk of Exposure/Tier 1 Patients Labs with Precautions)  ONCE -  STAT,   STAT    Question Answer Comment  Is this test for diagnosis or screening Diagnosis of ill patient   Symptomatic for COVID-19 as defined by CDC Unknown   Hospitalized for COVID-19 No   Admitted to ICU for COVID-19 No   Previously tested for COVID-19 No   Resident in a congregate (group) care setting Unknown   Employed in healthcare setting Unknown   Pregnant No      06/18/19 1654   Signed and Held  Basic metabolic panel  Tomorrow morning,   R     Signed and Held   Signed and Held  Creatinine, serum  (enoxaparin (LOVENOX)    CrCl >/= 30 ml/min)  Weekly,   R    Comments: while on enoxaparin therapy    Signed and Held   Signed and Held  Magnesium  Add-on,   R     Signed and Held   Signed and Held  CK  Tomorrow morning,   R     Signed and Held          Vitals/Pain Today's Vitals   06/18/19 1800 06/18/19 1955 06/18/19 2008 06/18/19 2030  BP: (!) 186/79  (!) 165/69 (!) 164/57  Pulse: 74  87 83  Resp: 18   18  Temp:      TempSrc:      SpO2: 100%  100% 100%  Weight:      Height:      PainSc:  3  3      Isolation Precautions No active isolations  Medications Medications  sodium chloride 0.9 % bolus 500 mL (0 mLs Intravenous Stopped 06/18/19 2009)    Mobility walks High fall risk   Focused Assessments Neuro Assessment Handoff:  Swallow screen pass? unneccessary  Neuro Assessment:   Neuro Checks:      Last Documented NIHSS Modified Score:   Has TPA been given? No If patient is a Neuro Trauma and patient is going to OR before floor call report to [facility] nurse: [phone] or [phone]     R Recommendations: See Admitting Provider Note  Report given to:   Additional Notes: pt's son has left bedside

## 2019-06-18 NOTE — ED Provider Notes (Signed)
[facility] Emergency Department Provider Note    First MD Initiated Contact with Patient 06/18/19 1513     (approximate)  I have reviewed the triage vital signs and the nursing notes.   HISTORY  Chief Complaint Fall    HPI Jamiyla Billie is a 85 y.o. female develops a past medical history presents to the ER for 2 syncopal events.  Patient was sitting in her chair reportedly fell out of the chair landing on the ground.  Reportedly first episode happened on Friday she was reportedly on the ground for over 6 hours before she was able to get help getting back up in the chair.  Second episode occurred yesterday and again patient was found on the ground for almost 6 hours.  She denies any palpitations chest pressure, nausea or vomiting.  No headaches.   She is having left lateral chest wall pain that she states she started having after the fall.   Past Medical History:  Diagnosis Date  . Anxiety   . Congenital foot deformity   . Dermatophytosis   . Hallucinations   . HLD (hyperlipidemia)   . HTN (hypertension)   . Hyperglycemia   . Irregular heart rhythm    pt unsure of what type; followed by PCP only  . Ischemic colitis ([place]) 7/13   on colonoscopy  . Memory loss   . Obesity   . Rotator cuff syndrome    Family History  Problem Relation Age of Onset  . Stroke Mother   . Parkinsonism Mother   . Hypertension Mother   . Alzheimer's disease Mother   . Cancer Father        bladder/prostate  . Breast cancer Neg Hx    Past Surgical History:  Procedure Laterality Date  . CATARACT EXTRACTION W/PHACO Right 09/09/2016   Procedure: CATARACT EXTRACTION PHACO AND INTRAOCULAR LENS PLACEMENT (IOC);  Surgeon: Leandrew Koyanagi, MD;  Location: [place];  Service: Ophthalmology;  Laterality: Right;  right  . CATARACT EXTRACTION W/PHACO Left 12/16/2016   Procedure: CATARACT EXTRACTION PHACO AND INTRAOCULAR LENS PLACEMENT ([place])  Left;  Surgeon: Leandrew Koyanagi, MD;  Location: [place];  Service: Ophthalmology;  Laterality: Left;  . HUMERUS FRACTURE SURGERY  10/1997  . TOTAL ABDOMINAL HYSTERECTOMY  1980's   due to menometrorrhagia   Patient Active Problem List   Diagnosis Date Noted  . Vitamin B12 deficiency 03/29/2019  . Change in mental status 01/05/2019  . Visual hallucinations 01/05/2019  . History of recurrent UTIs 01/05/2019  . UTI (urinary tract infection) 08/01/2018  . Screening mammogram, encounter for 05/31/2018  . Encounter for screening mammogram for breast cancer 05/28/2017  . Routine general medical examination at a health care facility 05/19/2016  . Ankle edema 05/01/2015  . Venous (peripheral) insufficiency 05/01/2015  . Encounter for Medicare annual wellness exam 06/02/2013  . Internal hemorrhoid 06/21/2012  . Morbid obesity ([place])   . HTN (hypertension)   . Hyperlipidemia associated with type 2 diabetes mellitus ([place])   . Diabetes type 2, controlled ([place])       Prior to Admission medications   Medication Sig Start Date End Date Taking? Authorizing Provider  ALPRAZolam Duanne Moron) 0.5 MG tablet Take 1 tablet (0.5 mg total) by mouth at bedtime as needed for anxiety. 05/25/19   Marcial Pacas, MD  atorvastatin (LIPITOR) 20 MG tablet Take 1 tablet (20 mg total) by mouth daily. 06/07/19   Tower, Wynelle Fanny, MD  cyanocobalamin (,VITAMIN B-12,) 1000 MCG/ML injection  Inject 1,000 mcg into the muscle every 8 (eight) weeks.     Tower, Wynelle Fanny, MD  furosemide (LASIX) 20 MG tablet Take 1 tablet (20 mg total) by mouth daily. 06/05/19   Tower, Wynelle Fanny, MD  hydrochlorothiazide (MICROZIDE) 12.5 MG capsule Take 1 capsule (12.5 mg total) by mouth daily. 06/05/19   Tower, Wynelle Fanny, MD  losartan (COZAAR) 50 MG tablet Take 1 tablet (50 mg total) by mouth daily. 06/05/19   Tower, Wynelle Fanny, MD  metFORMIN (GLUCOPHAGE) 500 MG tablet Take 1 tablet (500 mg total) by mouth 2 (two) times daily with a meal. 06/05/19   Tower, Wynelle Fanny, MD   nitrofurantoin, macrocrystal-monohydrate, (MACROBID) 100 MG capsule Take one capsule BID x 5 days. 05/29/19   Marcial Pacas, MD  QUEtiapine (SEROQUEL) 25 MG tablet Take 2 tablets (50 mg total) by mouth at bedtime. 03/08/19   Marcial Pacas, MD    Allergies Penicillins, Sulfamethoxazole-trimethoprim, and Amoxicillin    Social History Social History   Tobacco Use  . Smoking status: Never Smoker  . Smokeless tobacco: Never Used  Substance Use Topics  . Alcohol use: No  . Drug use: No    Review of Systems Patient denies headaches, rhinorrhea, blurry vision, numbness, shortness of breath, chest pain, edema, cough, abdominal pain, nausea, vomiting, diarrhea, dysuria, fevers, rashes or hallucinations unless otherwise stated above in HPI. ____________________________________________   PHYSICAL EXAM:  VITAL SIGNS: Vitals:   06/18/19 1329 06/18/19 1332  BP:  127/60  Pulse: 79   Resp: 16   Temp:  98.8 F (37.1 C)  SpO2: 99%     Constitutional: Alert and oriented.  Eyes: Conjunctivae are normal.  Head: Atraumatic. Nose: No congestion/rhinnorhea. Mouth/Throat: Mucous membranes are moist.   Neck: No stridor. Painless ROM.  Cardiovascular: Normal rate, regular rhythm. Grossly normal heart sounds.  Good peripheral circulation. Respiratory: Normal respiratory effort.  No retractions. Lungs CTAB. Gastrointestinal: Soft and nontender. No distention. No abdominal bruits. No CVA tenderness. Genitourinary:  Musculoskeletal: No lower extremity tenderness nor edema. ttp of left lateral chest wall. No joint effusions. Neurologic:  Normal speech and language. No gross focal neurologic deficits are appreciated. No facial droop Skin:  Skin is warm, dry and intact. No rash noted. Psychiatric: Mood and affect are normal. Speech and behavior are normal.  ____________________________________________   LABS (all labs ordered are listed, but only abnormal results are displayed)  Results for orders  placed or performed during the hospital encounter of 06/18/19 (from the past 24 hour(s))  CBC with Differential     Status: Abnormal   Collection Time: 06/18/19  1:45 PM  Result Value Ref Range   WBC 9.2 4.0 - 10.5 K/uL   RBC 4.43 3.87 - 5.11 MIL/uL   Hemoglobin 11.4 (L) 12.0 - 15.0 g/dL   HCT 36.1 36.0 - 46.0 %   MCV 81.5 80.0 - 100.0 fL   MCH 25.7 (L) 26.0 - 34.0 pg   MCHC 31.6 30.0 - 36.0 g/dL   RDW 15.6 (H) 11.5 - 15.5 %   Platelets 319 150 - 400 K/uL   nRBC 0.0 0.0 - 0.2 %   Neutrophils Relative % 74 %   Neutro Abs 6.9 1.7 - 7.7 K/uL   Lymphocytes Relative 13 %   Lymphs Abs 1.2 0.7 - 4.0 K/uL   Monocytes Relative 7 %   Monocytes Absolute 0.6 0.1 - 1.0 K/uL   Eosinophils Relative 4 %   Eosinophils Absolute 0.4 0.0 - 0.5 K/uL   Basophils  Relative 1 %   Basophils Absolute 0.1 0.0 - 0.1 K/uL   Immature Granulocytes 1 %   Abs Immature Granulocytes 0.05 0.00 - 0.07 K/uL  Comprehensive metabolic panel     Status: Abnormal   Collection Time: 06/18/19  1:45 PM  Result Value Ref Range   Sodium 141 135 - 145 mmol/L   Potassium 3.3 (L) 3.5 - 5.1 mmol/L   Chloride 103 98 - 111 mmol/L   CO2 28 22 - 32 mmol/L   Glucose, Bld 147 (H) 70 - 99 mg/dL   BUN 17 8 - 23 mg/dL   Creatinine, Ser 0.84 0.44 - 1.00 mg/dL   Calcium 8.8 (L) 8.9 - 10.3 mg/dL   Total Protein 6.7 6.5 - 8.1 g/dL   Albumin 3.8 3.5 - 5.0 g/dL   AST 40 15 - 41 U/L   ALT 24 0 - 44 U/L   Alkaline Phosphatase 89 38 - 126 U/L   Total Bilirubin 1.0 0.3 - 1.2 mg/dL   GFR calc non Af Amer >60 >60 mL/min   GFR calc Af Amer >60 >60 mL/min   Anion gap 10 5 - 15  CK     Status: Abnormal   Collection Time: 06/18/19  1:45 PM  Result Value Ref Range   Total CK 656 (H) 38 - 234 U/L  Troponin I (High Sensitivity)     Status: [place]   Collection Time: 06/18/19  1:45 PM  Result Value Ref Range   Troponin I (High Sensitivity) 3 <18 ng/L   ____________________________________________  EKG My review and personal interpretation at  Time: 13:42   Indication: syncope  Rate: 75  Rhythm: sinus Axis: normal Other: poor r wave progression, no stemi ____________________________________________  RADIOLOGY  I personally reviewed all radiographic images ordered to evaluate for the above acute complaints and reviewed radiology reports and findings.  These findings were personally discussed with the patient.  Please see medical record for radiology report.  ____________________________________________   PROCEDURES  Procedure(s) performed:  Procedures    Critical Care performed: no ____________________________________________   INITIAL IMPRESSION / ASSESSMENT AND PLAN / ED COURSE  Pertinent labs & imaging results that were available during my care of the patient were reviewed by me and considered in my medical decision making (see chart for details).   DDX: dehydration, uti, sdh, tia, dysrhythmia, rhabdo, acs, chf  Trayce Danger is a 85 y.o. who presents to the ED with symptoms as described above.  Patient arrives nontoxic-appearing but history concerning for 2 syncopal episodes of the weekend.  CT imaging ordered to evaluate for traumatic injury shows no acute traumatic injury.  Does have elevated CK.  Fortunately her renal function is normal.  She is describing dysuria frequency do suspect there may be cystitis component.  No abdominal pain or flank pain.  Troponin is negative.  Do feel the patient will require hospitalization for hemodynamic monitoring and syncope work-up.     The patient was evaluated in Emergency Department today for the symptoms described in the history of present illness. He/she was evaluated in the context of the global COVID-19 pandemic, which necessitated consideration that the patient might be at risk for infection with the SARS-CoV-2 virus that causes COVID-19. Institutional protocols and algorithms that pertain to the evaluation of patients at risk for COVID-19 are in a state of rapid change  based on information released by regulatory bodies including the CDC and federal and state organizations. These policies and algorithms were followed during the  patient's care in the ED.  As part of my medical decision making, I reviewed the following data within the [facility] notes reviewed and incorporated, Labs reviewed, notes from prior ED visits and [place] Controlled Substance Database   ____________________________________________   FINAL CLINICAL IMPRESSION(S) / ED DIAGNOSES  Final diagnoses:  Syncope and collapse      NEW MEDICATIONS STARTED DURING THIS VISIT:  New Prescriptions   No medications on file     Note:  This document was prepared using Dragon voice recognition software and may include unintentional dictation errors.    Merlyn Lot, MD 06/18/19 [phone]

## 2019-06-18 NOTE — Plan of Care (Signed)
  Problem: Education: Goal: Knowledge of General Education information will improve Description: Including pain rating scale, medication(s)/side effects and non-pharmacologic comfort measures Outcome: Progressing   Problem: Clinical Measurements: Goal: Ability to maintain clinical measurements within normal limits will improve Outcome: Progressing Goal: Will remain free from infection Outcome: Progressing Goal: Respiratory complications will improve Outcome: Progressing   Problem: Skin Integrity: Goal: Risk for impaired skin integrity will decrease Outcome: Progressing

## 2019-06-19 ENCOUNTER — Observation Stay
Admit: 2019-06-19 | Discharge: 2019-06-19 | Disposition: A | Discharge status: Home / Self Care | Payer: PPO | Attending: Internal Medicine | Admitting: Internal Medicine

## 2019-06-19 ENCOUNTER — Telehealth: Payer: Self-pay

## 2019-06-19 DIAGNOSIS — I10 Essential (primary) hypertension: Secondary | ICD-10-CM | POA: Diagnosis not present

## 2019-06-19 DIAGNOSIS — M6282 Rhabdomyolysis: Secondary | ICD-10-CM | POA: Diagnosis not present

## 2019-06-19 DIAGNOSIS — R55 Syncope and collapse: Secondary | ICD-10-CM | POA: Diagnosis not present

## 2019-06-19 DIAGNOSIS — E876 Hypokalemia: Secondary | ICD-10-CM | POA: Diagnosis not present

## 2019-06-19 LAB — CK: Total CK: 544 U/L — ABNORMAL HIGH (ref 38–234)

## 2019-06-19 LAB — BASIC METABOLIC PANEL
Anion gap: 9 (ref 5–15)
BUN: 16 mg/dL (ref 8–23)
CO2: 27 mmol/L (ref 22–32)
Calcium: 8.5 mg/dL — ABNORMAL LOW (ref 8.9–10.3)
Chloride: 107 mmol/L (ref 98–111)
Creatinine, Ser: 0.82 mg/dL (ref 0.44–1.00)
GFR calc Af Amer: >60 mL/min (ref >60)
GFR calc non Af Amer: >60 mL/min (ref >60)
Glucose, Bld: 120 mg/dL — ABNORMAL HIGH (ref 70–99)
Potassium: 3.9 mmol/L (ref 3.5–5.1)
Sodium: 143 mmol/L (ref 135–145)

## 2019-06-19 LAB — SARS CORONAVIRUS 2 (TAT 6-24 HRS): SARS Coronavirus 2: NEGATIVE

## 2019-06-19 LAB — ECHOCARDIOGRAM COMPLETE
Height: 65 in
Weight: 4049.41 oz

## 2019-06-19 LAB — GLUCOSE, CAPILLARY: Glucose-Capillary: 112 mg/dL — ABNORMAL HIGH (ref 70–99)

## 2019-06-19 MED ORDER — INSULIN ASPART 100 UNIT/ML ~~LOC~~ SOLN: 0.0000 [IU] | Freq: Three times a day (TID) | SUBCUTANEOUS | Status: DC

## 2019-06-19 MED ORDER — INSULIN ASPART 100 UNIT/ML ~~LOC~~ SOLN: 0.0000 [IU] | Freq: Every day | SUBCUTANEOUS | Status: DC

## 2019-06-19 MED ORDER — LOSARTAN POTASSIUM 25 MG PO TABS: 25.0000 mg | ORAL_TABLET | Freq: Every day | ORAL | 0 refills | Status: DC

## 2019-06-19 MED ORDER — ENOXAPARIN SODIUM 40 MG/0.4ML ~~LOC~~ SOLN: 40.0000 mg | Freq: Two times a day (BID) | SUBCUTANEOUS | Status: DC

## 2019-06-19 MED ORDER — LOSARTAN POTASSIUM 25 MG PO TABS
25.0000 mg | ORAL_TABLET | Freq: Every day | ORAL | Status: DC
  Administered 2019-06-19: 25 mg via ORAL
  Filled 2019-06-19: qty 1

## 2019-06-19 NOTE — Consult Note (Signed)
PHARMACIST - PHYSICIAN COMMUNICATION  CONCERNING:  Enoxaparin (Lovenox) for DVT Prophylaxis    RECOMMENDATION: Patient was prescribed enoxaprin 40mg  q24 hours for VTE prophylaxis.   Filed Weights   06/18/19 1331  Weight: 253 lb 1.4 oz (114.8 kg)    Body mass index is 42.12 kg/m.  Estimated Creatinine Clearance: 65.7 mL/min (by C-G formula based on SCr of 0.82 mg/dL).   Based on [place] patient is candidate for enoxaparin 40mg  every 12 hour dosing due to BMI being >40.  DESCRIPTION: Pharmacy has adjusted enoxaparin dose per [facility] policy.  Patient is now receiving enoxaparin 40mg  every 12 hours.   Oswald Hillock, PharmD Clinical Pharmacist  06/19/2019 9:05 AM

## 2019-06-19 NOTE — Progress Notes (Signed)
Hannah Duran to be D/C'd Home per MD order.  Discussed prescriptions and follow up appointments with the patient. Prescriptions given to patient, medication list explained in detail. Pt verbalized understanding.  Allergies as of 06/19/2019      Reactions   Penicillins    REACTION: Rash   Sulfamethoxazole-trimethoprim    rash   Amoxicillin Rash      Medication List    STOP taking these medications   furosemide 20 MG tablet Commonly known as: LASIX   hydrochlorothiazide 12.5 MG capsule Commonly known as: MICROZIDE     TAKE these medications   ALPRAZolam 0.5 MG tablet Commonly known as: XANAX Take 1 tablet (0.5 mg total) by mouth at bedtime as needed for anxiety.   atorvastatin 20 MG tablet Commonly known as: LIPITOR Take 1 tablet (20 mg total) by mouth daily.   cyanocobalamin 1000 MCG/ML injection Commonly known as: (VITAMIN B-12) Inject 1,000 mcg into the muscle every 8 (eight) weeks.   losartan 25 MG tablet Commonly known as: COZAAR Take 1 tablet (25 mg total) by mouth daily. Start taking on: June 20, 2019 What changed:   medication strength  how much to take   metFORMIN 500 MG tablet Commonly known as: GLUCOPHAGE Take 1 tablet (500 mg total) by mouth 2 (two) times daily with a meal.   QUEtiapine 25 MG tablet Commonly known as: SEROquel Take 2 tablets (50 mg total) by mouth at bedtime. What changed: how much to take       Vitals:   06/19/19 0958 06/19/19 1300  BP: (!) 150/66 (!) 147/71  Pulse: 60 72  Resp: 18 18  Temp:    SpO2: 100% 100%    Tele box removed and returned.Skin clean, dry and intact without evidence of skin break down, no evidence of skin tears noted. IV catheter discontinued intact. Site without signs and symptoms of complications. Dressing and pressure applied. Pt denies pain at this time. No complaints noted.  An After Visit Summary was printed and given to the patient. Patient escorted via [place], and D/C home via private  auto.  Rolley Sims

## 2019-06-19 NOTE — Consult Note (Signed)
Cardiology Consultation Note    Patient ID: Hannah Duran, MRN: [id number], DOB/AGE: Apr 19, 1934 85 y.o. Admit date: 06/18/2019   Date of Consult: 06/19/2019 Primary Physician: Tower, Wynelle Fanny, MD Primary Cardiologist:    Chief Complaint: syncope Reason for Consultation: syncope Requesting MD: Dr. Anselm Jungling  HPI: Hannah Duran is a 85 y.o. female with history of hypertension, hyper glycemia, occasional irregular heartbeat who presented to the emergency room after 2 episodes of apparent syncope.  Patient has never had this before.  Has had no cardiac problems before.  The first episode occurred while she was sitting on a kitchen chair.  She found herself on the floor.  She is unaware of how she ended up there.  She spent approximately 6 hours on the floor per her report due to not being able to find a way to call her family.  Her second episode occurred while at an event.  She states she lost consciousness while sitting on the floor.  She denies any falling.  She was brought to the emergency room where EKG revealed sinus rhythm.  She underwent a chest CT which revealed no acute intrathoracic pathology.  No rib fractures.  Brain CT revealed no acute intracranial abnormalities.  Chest x-ray revealed cardiomegaly with no pulmonary edema.  Laboratories revealed relative hypokalemia with a potassium of 3.3.  Creatinine was 0.84.  High-sensitivity troponin was normal x2.  She is relatively hypotensive today.  She is on atorvastatin 20 mg daily, furosemide 20 mg daily, hydrochlorothiazide 12.5 mg daily, losartan 50 mg daily.  She denies chest pain or shortness of breath.  Telemetry shows sinus rhythm.  Her pulse ox is normal.  Past Medical History:  Diagnosis Date  . Anxiety   . Congenital foot deformity   . Dermatophytosis   . Hallucinations   . HLD (hyperlipidemia)   . HTN (hypertension)   . Hyperglycemia   . Irregular heart rhythm    pt unsure of what type; followed by PCP only  . Ischemic  colitis ([place]) 7/13   on colonoscopy  . Memory loss   . Obesity   . Rotator cuff syndrome       Surgical History:  Past Surgical History:  Procedure Laterality Date  . CATARACT EXTRACTION W/PHACO Right 09/09/2016   Procedure: CATARACT EXTRACTION PHACO AND INTRAOCULAR LENS PLACEMENT (IOC);  Surgeon: Leandrew Koyanagi, MD;  Location: [facility];  Service: Ophthalmology;  Laterality: Right;  right  . CATARACT EXTRACTION W/PHACO Left 12/16/2016   Procedure: CATARACT EXTRACTION PHACO AND INTRAOCULAR LENS PLACEMENT ([place])  Left;  Surgeon: Leandrew Koyanagi, MD;  Location: [facility];  Service: Ophthalmology;  Laterality: Left;  . HUMERUS FRACTURE SURGERY  10/1997  . TOTAL ABDOMINAL HYSTERECTOMY  1980's   due to menometrorrhagia     Home Meds: Prior to Admission medications   Medication Sig Start Date End Date Taking? Authorizing Provider  ALPRAZolam Duanne Moron) 0.5 MG tablet Take 1 tablet (0.5 mg total) by mouth at bedtime as needed for anxiety. 05/25/19  Yes Marcial Pacas, MD  atorvastatin (LIPITOR) 20 MG tablet Take 1 tablet (20 mg total) by mouth daily. 06/07/19  Yes Tower, Wynelle Fanny, MD  cyanocobalamin (,VITAMIN B-12,) 1000 MCG/ML injection Inject 1,000 mcg into the muscle every 8 (eight) weeks.    Yes Tower, Wynelle Fanny, MD  furosemide (LASIX) 20 MG tablet Take 1 tablet (20 mg total) by mouth daily. 06/05/19  Yes Tower, Wynelle Fanny, MD  hydrochlorothiazide (MICROZIDE) 12.5 MG capsule Take 1 capsule (  12.5 mg total) by mouth daily. 06/05/19  Yes Tower, Wynelle Fanny, MD  losartan (COZAAR) 50 MG tablet Take 1 tablet (50 mg total) by mouth daily. 06/05/19  Yes Tower, Wynelle Fanny, MD  metFORMIN (GLUCOPHAGE) 500 MG tablet Take 1 tablet (500 mg total) by mouth 2 (two) times daily with a meal. 06/05/19  Yes Tower, Wynelle Fanny, MD  QUEtiapine (SEROQUEL) 25 MG tablet Take 2 tablets (50 mg total) by mouth at bedtime. Patient taking differently: Take 1-1.5 mg by mouth at bedtime.  03/08/19  Yes Marcial Pacas, MD     Inpatient Medications:  . atorvastatin  20 mg Oral Daily  . enoxaparin (LOVENOX) injection  40 mg Subcutaneous Q24H  . losartan  50 mg Oral Daily   . sodium chloride 75 mL/hr at 06/19/19 0436    Allergies:  Allergies  Allergen Reactions  . Penicillins     REACTION: Rash  . Sulfamethoxazole-Trimethoprim     rash  . Amoxicillin Rash    Social History   Socioeconomic History  . Marital status: Widowed    Spouse name: Not on file  . Number of children: 2  . Years of education: 57  . Highest education level: High school graduate  Occupational History  . Occupation: retired Transport planner  . Financial resource strain: Not hard at all  . Food insecurity    Worry: Never true    Inability: Never true  . Transportation needs    Medical: No    Non-medical: No  Tobacco Use  . Smoking status: Never Smoker  . Smokeless tobacco: Never Used  Substance and Sexual Activity  . Alcohol use: No  . Drug use: No  . Sexual activity: Never  Lifestyle  . Physical activity    Days per week: 0 days    Minutes per session: 0 min  . Stress: Not at all  Relationships  . Social Herbalist on phone: Not on file    Gets together: Not on file    Attends religious service: Not on file    Active member of club or organization: Not on file    Attends meetings of clubs or organizations: Not on file    Relationship status: Not on file  . Intimate partner violence    Fear of current or ex partner: No    Emotionally abused: No    Physically abused: No    Forced sexual activity: No  Other Topics Concern  . Not on file  Social History Narrative   Lives at home alone.   Right-handed.   One cup coffee per day.     Family History  Problem Relation Age of Onset  . Stroke Mother   . Parkinsonism Mother   . Hypertension Mother   . Alzheimer's disease Mother   . Cancer Father        bladder/prostate  . Breast cancer Neg Hx      Review of Systems: A 12-system  review of systems was performed and is negative except as noted in the HPI.  Labs: Recent Labs    06/18/19 1345 06/19/19 0520  CKTOTAL 656* 544*   Lab Results  Component Value Date   WBC 9.2 06/18/2019   HGB 11.4 (L) 06/18/2019   HCT 36.1 06/18/2019   MCV 81.5 06/18/2019   PLT 319 06/18/2019    Recent Labs  Lab 06/18/19 1345 06/19/19 0520  NA 141 143  K 3.3* 3.9  CL 103 107  CO2  28 27  BUN 17 16  CREATININE 0.84 0.82  CALCIUM 8.8* 8.5*  PROT 6.7  --   BILITOT 1.0  --   ALKPHOS 89  --   ALT 24  --   AST 40  --   GLUCOSE 147* 120*   Lab Results  Component Value Date   CHOL 195 06/05/2019   HDL 41.80 06/05/2019   LDLCALC 130 (H) 06/05/2019   TRIG 118.0 06/05/2019   No results found for: DDIMER  Radiology/Studies:  Dg Chest 2 View  Result Date: 06/18/2019 CLINICAL DATA:  Acute LEFT chest and rib pain following fall. Initial encounter. EXAM: CHEST - 2 VIEW COMPARISON:  None. FINDINGS: Mild cardiomegaly is noted. Mild LEFT basilar atelectasis versus scarring is noted. There is no evidence of focal airspace disease, pulmonary edema, suspicious pulmonary nodule/mass, pleural effusion, or pneumothorax. No acute bony abnormalities are identified. IMPRESSION: 1. Mild LEFT basilar atelectasis/scarring. 2. Mild cardiomegaly. Electronically Signed   By: Margarette Canada M.D.   On: 06/18/2019 14:38   Ct Head Wo Contrast  Result Date: 06/18/2019 CLINICAL DATA:  Status post fall EXAM: CT HEAD WITHOUT CONTRAST TECHNIQUE: Contiguous axial images were obtained from the base of the skull through the vertex without intravenous contrast. COMPARISON:  MRI brain dated 03/16/2019. CT head dated 07/25/2018. FINDINGS: Brain: No evidence of acute infarction, hemorrhage, hydrocephalus, extra-axial collection or mass lesion/mass effect. Subcortical white matter and periventricular small vessel ischemic changes. Right frontal meningioma (series 4/image 13), benign. Vascular: Intracranial  atherosclerosis. Skull: Normal. Negative for fracture or focal lesion. Sinuses/Orbits: The visualized paranasal sinuses are essentially clear. The mastoid air cells are unopacified. Other: None. IMPRESSION: No evidence of acute intracranial abnormality. Small vessel ischemic changes.  Right frontal meningioma, benign. Electronically Signed   By: Julian Hy M.D.   On: 06/18/2019 15:45   Ct Chest Wo Contrast  Result Date: 06/18/2019 CLINICAL DATA:  85 year old female with trauma and concern for rib fracture. Patient is having pain in the left side of the chest. EXAM: CT CHEST WITHOUT CONTRAST TECHNIQUE: Multidetector CT imaging of the chest was performed following the standard protocol without IV contrast. COMPARISON:  Chest radiograph dated 06/18/2019 FINDINGS: Evaluation of this exam is limited in the absence of intravenous contrast. Cardiovascular: Borderline cardiomegaly. No pericardial effusion. Coronary vascular calcifications noted. Mild atherosclerotic calcification of the thoracic aorta. The central pulmonary arteries are grossly unremarkable on this noncontrast CT. Mediastinum/Nodes: No hilar or mediastinal adenopathy. Small hiatal hernia. The esophagus and the thyroid gland are grossly unremarkable. No mediastinal fluid collection. Lungs/Pleura: There is a 4 mm left lower lobe subpleural nodule (series 3 image 91). The lungs are otherwise clear. There is no pleural effusion or pneumothorax. The central airways are patent. Upper Abdomen: Bilateral renal hypodense lesions which are not characterized on this non-contrast CT and new or increased in size since the CT angiogram of 01/22/2012. Further evaluation with ultrasound on a nonemergent basis recommended. There is scattered colonic diverticula. The visualized upper abdomen is otherwise unremarkable. Musculoskeletal: Degenerative changes of the spine. Sclerotic area in the T8 vertebra, indeterminate, likely benign. Correlation with history of  primary no known malignancy recommended. No acute osseous pathology. No displaced rib fractures. IMPRESSION: 1. No acute/traumatic intrathoracic pathology. No displaced rib fractures. 2. Aortic Atherosclerosis (ICD10-I70.0). Electronically Signed   By: Anner Crete M.D.   On: 06/18/2019 15:54   Ct Cervical Spine Wo Contrast  Result Date: 06/18/2019 CLINICAL DATA:  85 year old female with history of trauma from a fall. Evaluate  for cervical spine fracture. EXAM: CT CERVICAL SPINE WITHOUT CONTRAST TECHNIQUE: Multidetector CT imaging of the cervical spine was performed without intravenous contrast. Multiplanar CT image reconstructions were also generated. COMPARISON:  No priors. FINDINGS: Alignment: Reversal of normal cervical lordosis centered at the level of C4, likely chronic and degenerative. Alignment is otherwise anatomic. Skull base and vertebrae: No acute fracture. No primary bone lesion or focal pathologic process. Soft tissues and spinal canal: No prevertebral fluid or swelling. No visible canal hematoma. Disc levels: Multilevel degenerative disc disease, most pronounced at C4-C5, C5-C6 and C6-C7. Moderate multilevel facet arthropathy. Upper chest: Negative. Other: None. IMPRESSION: 1. No evidence of significant acute traumatic injury to the cervical spine. 2. Multilevel degenerative disc disease and cervical spondylosis, as above. Electronically Signed   By: Vinnie Langton M.D.   On: 06/18/2019 14:27    Wt Readings from Last 3 Encounters:  06/18/19 114.8 kg  06/05/19 114.8 kg  05/25/19 114.1 kg    EKG: Sinus rhythm with no ischemia  Physical Exam:  Blood pressure 128/83, pulse 77, temperature 98.1 F (36.7 C), temperature source Oral, resp. rate 20, height 5\' 5"  (1.651 m), weight 114.8 kg, last menstrual period 08/31/1980, SpO2 100 %. Body mass index is 42.12 kg/m. General: Well developed, well nourished, in no acute distress. Head: Normocephalic, atraumatic, sclera non-icteric, no  xanthomas, nares are without discharge.  Neck: Negative for carotid bruits. JVD not elevated. Lungs: Clear bilaterally to auscultation without wheezes, rales, or rhonchi. Breathing is unlabored. Heart: RRR with S1 S2. No murmurs, rubs, or gallops appreciated. Abdomen: Soft, non-tender, non-distended with normoactive bowel sounds. No hepatomegaly. No rebound/guarding. No obvious abdominal masses. Msk:  Strength and tone appear normal for age. Extremities: No clubbing or cyanosis. No edema.  Distal pedal pulses are 2+ and equal bilaterally. Neuro: Alert and oriented X 3. No facial asymmetry. No focal deficit. Moves all extremities spontaneously. Psych:  Responds to questions appropriately with a normal affect.     Assessment and Plan  85 year old female with no cardiac history, history of hypertension who was admitted after 2 apparent syncopal episodes.  Etiology of these are unclear.  She has been relatively hypotensive although not severely so or symptomatically so since presentation.  She ruled out for myocardial infarction.  There are no arrhythmias.  No abnormalities on the head CT chest CT or chest x-ray that would explain this.  She has had no arrhythmias.  Echocardiogram is pending.  She denies chest pain.  Will follow on telemetry.  Will reduce her losartan to 25 mg daily to allow her blood pressure to come up a little bit.  We will follow for evidence of rhythm.  After echo and modification of her medicines consideration for functional study could be raised although this does not appear to be ischemic.  Further recommendations after echo and blood pressure changes completed.  Signed, Teodoro Spray MD 06/19/2019, 7:34 AM Pager: [phone]

## 2019-06-19 NOTE — Telephone Encounter (Signed)
[place] Night - Client Nonclinical Telephone Record AccessNurse Client [facility] Night - Client Client Site [place] Physician Loura Pardon - MD Contact Type Call Who Is Calling Patient / Member / Family / Caregiver Caller Name [place] Phone Number [phone] Patient Name Yaslin Getz Patient DOB [birth year] Call Type Message Only Information Provided Reason for Call Request to [place] Appointment Initial Comment Caller states her mom is supposed to come in for B12 shot but she has admitted to hospital so she will not be at her appt tomorrow. Additional Comment Office hours provided. Call Closed By: Melonie [place] Transaction Date/Time: 06/18/2019 8:20:27 PM (ET)

## 2019-06-19 NOTE — Progress Notes (Signed)
*  PRELIMINARY RESULTS* Echocardiogram 2D Echocardiogram has been performed.  Sherrie Sport 06/19/2019, 9:23 AM

## 2019-06-20 ENCOUNTER — Telehealth: Payer: Self-pay

## 2019-06-20 NOTE — Discharge Summary (Signed)
[name]    MR#:  [id number]  DATE OF BIRTH:  1934-04-20  DATE OF ADMISSION:  06/18/2019 ADMITTING PHYSICIAN: Demetrios Loll, MD  DATE OF DISCHARGE: 06/19/2019  3:11 PM  PRIMARY CARE PHYSICIAN: Tower, Wynelle Fanny, MD    ADMISSION DIAGNOSIS:  Syncope and collapse [R55]  DISCHARGE DIAGNOSIS:  Active Problems:   Syncope   SECONDARY DIAGNOSIS:   Past Medical History:  Diagnosis Date  . Anxiety   . Congenital foot deformity   . Dermatophytosis   . Hallucinations   . HLD (hyperlipidemia)   . HTN (hypertension)   . Hyperglycemia   . Irregular heart rhythm    pt unsure of what type; followed by PCP only  . Ischemic colitis ([place]) 7/13   on colonoscopy  . Memory loss   . Obesity   . Rotator cuff syndrome     HOSPITAL COURSE:   Patient came with 2 syncopal episode to emergency room.  No abnormalities noted on CT chest or x-ray chest.  Troponins were negative.  There is no arrhythmia noted on telemetry.  Her blood pressure was running slightly on the lower side so her blood pressure medications were changed.  She is seen by cardiologist in the hospital and suggested no further changes.  She was feeling comfortable walking.  DISCHARGE CONDITIONS:   Stable  CONSULTS OBTAINED:  Treatment Team:  Teodoro Spray, MD  DRUG ALLERGIES:   Allergies  Allergen Reactions  . Penicillins     REACTION: Rash  . Sulfamethoxazole-Trimethoprim     rash  . Amoxicillin Rash    DISCHARGE MEDICATIONS:   Allergies as of 06/19/2019      Reactions   Penicillins    REACTION: Rash   Sulfamethoxazole-trimethoprim    rash   Amoxicillin Rash      Medication List    STOP taking these medications   furosemide 20 MG tablet Commonly known as: LASIX   hydrochlorothiazide 12.5 MG capsule Commonly known as: MICROZIDE     TAKE these medications   ALPRAZolam 0.5 MG tablet Commonly known as: XANAX Take 1 tablet  (0.5 mg total) by mouth at bedtime as needed for anxiety.   atorvastatin 20 MG tablet Commonly known as: LIPITOR Take 1 tablet (20 mg total) by mouth daily.   cyanocobalamin 1000 MCG/ML injection Commonly known as: (VITAMIN B-12) Inject 1,000 mcg into the muscle every 8 (eight) weeks.   losartan 25 MG tablet Commonly known as: COZAAR Take 1 tablet (25 mg total) by mouth daily. What changed:   medication strength  how much to take   metFORMIN 500 MG tablet Commonly known as: GLUCOPHAGE Take 1 tablet (500 mg total) by mouth 2 (two) times daily with a meal.   QUEtiapine 25 MG tablet Commonly known as: SEROquel Take 2 tablets (50 mg total) by mouth at bedtime. What changed: how much to take        DISCHARGE INSTRUCTIONS:   Stable  If you experience worsening of your admission symptoms, develop shortness of breath, life threatening emergency, suicidal or homicidal thoughts you must seek medical attention immediately by calling 911 or calling your MD immediately  if symptoms less severe.  You Must read complete instructions/literature along with all the possible adverse reactions/side effects for all the Medicines you take and that have been prescribed to you. Take any new Medicines after you have completely understood and accept all the possible adverse reactions/side effects.  Please note  You were cared for by a hospitalist during your hospital stay. If you have any questions about your discharge medications or the care you received while you were in the hospital after you are discharged, you can call the unit and asked to speak with the hospitalist on call if the hospitalist that took care of you is not available. Once you are discharged, your primary care physician will handle any further medical issues. Please note that NO REFILLS for any discharge medications will be authorized once you are discharged, as it is imperative that you return to your primary care physician (or  establish a relationship with a primary care physician if you do not have one) for your aftercare needs so that they can reassess your need for medications and monitor your lab values.    Today   CHIEF COMPLAINT:   Chief Complaint  Patient presents with  . Fall    HISTORY OF PRESENT ILLNESS:  Hannah Duran  is a 85 y.o. female with a known history of hypertension, hyperlipidemia, irregular heart rhythm, memory loss, anxiety, ischemic colitis etc.  The patient presents the ED with above chief complaints.  The patient passed out from chair to the ground for 6 hours today and yesterday.  She denies any symptoms before and after syncope episodes. She said she was started water pill for leg swelling 1 week ago.  She is found mild elevated CK.  CAT scan of the head is unremarkable.  CT angiogram of chest is unremarkable.  Dr. Quentin Cornwall request admission for syncope work-up.   VITAL SIGNS:  Blood pressure (!) 147/71, pulse 72, temperature 98.1 F (36.7 C), temperature source Oral, resp. rate 18, height 5\' 5"  (1.651 m), weight 114.8 kg, last menstrual period 08/31/1980, SpO2 100 %.  I/O:  No intake or output data in the 24 hours ending 06/20/19 1608  PHYSICAL EXAMINATION:  GENERAL:  85 y.o.-year-old patient lying in the bed with no acute distress.  EYES: Pupils equal, round, reactive to light and accommodation. No scleral icterus. Extraocular muscles intact.  HEENT: Head atraumatic, normocephalic. Oropharynx and nasopharynx clear.  NECK:  Supple, no jugular venous distention. No thyroid enlargement, no tenderness.  LUNGS: Normal breath sounds bilaterally, no wheezing, rales,rhonchi or crepitation. No use of accessory muscles of respiration.  CARDIOVASCULAR: S1, S2 normal. No murmurs, rubs, or gallops.  ABDOMEN: Soft, non-tender, non-distended. Bowel sounds present. No organomegaly or mass.  EXTREMITIES: No pedal edema, cyanosis, or clubbing.  NEUROLOGIC: Cranial nerves II through XII are  intact. Muscle strength 5/5 in all extremities. Sensation intact. Gait not checked.  PSYCHIATRIC: The patient is alert and oriented x 3.  SKIN: No obvious rash, lesion, or ulcer.   DATA REVIEW:   CBC Recent Labs  Lab 06/18/19 1345  WBC 9.2  HGB 11.4*  HCT 36.1  PLT 319    Chemistries  Recent Labs  Lab 06/18/19 1345 06/18/19 1719 06/19/19 0520  NA 141  --  143  K 3.3*  --  3.9  CL 103  --  107  CO2 28  --  27  GLUCOSE 147*  --  120*  BUN 17  --  16  CREATININE 0.84  --  0.82  CALCIUM 8.8*  --  8.5*  MG  --  2.2  --   AST 40  --   --   ALT 24  --   --   ALKPHOS 89  --   --   BILITOT 1.0  --   --  Cardiac Enzymes No results for input(s): TROPONINI in the last 168 hours.  Microbiology Results  Results for orders placed or performed during the hospital encounter of 06/18/19  SARS CORONAVIRUS 2 (TAT 6-24 HRS) Nasopharyngeal Nasopharyngeal Swab     Status: None   Collection Time: 06/18/19  5:19 PM   Specimen: Nasopharyngeal Swab  Result Value Ref Range Status   SARS Coronavirus 2 NEGATIVE NEGATIVE Final    Comment: (NOTE) SARS-CoV-2 target nucleic acids are NOT DETECTED. The SARS-CoV-2 RNA is generally detectable in upper and lower respiratory specimens during the acute phase of infection. Negative results do not preclude SARS-CoV-2 infection, do not rule out co-infections with other pathogens, and should not be used as the sole basis for treatment or other patient management decisions. Negative results must be combined with clinical observations, patient history, and epidemiological information. The expected result is Negative. Fact Sheet for Patients: SugarRoll.be Fact Sheet for Healthcare Providers: https://www.woods-mathews.com/ This test is not yet approved or cleared by the [place] FDA and  has been authorized for detection and/or diagnosis of SARS-CoV-2 by FDA under an Emergency Use Authorization (EUA).  This EUA will remain  in effect (meaning this test can be used) for the duration of the COVID-19 declaration under Section 56 4(b)(1) of the Act, 21 U.S.C. section 360bbb-3(b)(1), unless the authorization is terminated or revoked sooner. Performed at [facility], [place] [address], [place], [place] [address]     RADIOLOGY:  No results found.  EKG:   Orders placed or performed during the hospital encounter of 06/18/19  . ED EKG  . ED EKG      Management plans discussed with the patient, family and they are in agreement.  CODE STATUS:  Code Status History    Date Active Date Inactive Code Status Order ID Comments User Context   06/18/2019 2137 06/19/2019 1918 DNR [id number]  Demetrios Loll, MD ED   Advance Care Planning Activity    Questions for Most Recent Historical Code Status (Order [id number])    Question Answer Comment   In the event of cardiac or respiratory ARREST Do not call a "code blue"    In the event of cardiac or respiratory ARREST Do not perform Intubation, CPR, defibrillation or ACLS    In the event of cardiac or respiratory ARREST Use medication by any route, position, wound care, and other measures to relive pain and suffering. May use oxygen, suction and manual treatment of airway obstruction as needed for comfort.         Advance Directive Documentation     Most Recent Value  Type of Advance Directive  Healthcare Power of Attorney, Living will  Pre-existing out of facility DNR order (yellow form or pink MOST form)  -  "MOST" Form in Place?  -      TOTAL TIME TAKING CARE OF THIS PATIENT: 35 minutes.    Vaughan Basta M.D on 06/20/2019 at 4:08 PM  Between 7am to 6pm - Pager - [phone]  After 6pm go to [email or web address] - password [email or web address] [facility]  Office  [phone]  CC: Primary care physician; Tower, Wynelle Fanny, MD   Note: This dictation was prepared with Dragon dictation along with smaller phrase technology.  Any transcriptional errors that result from this process are unintentional.

## 2019-06-20 NOTE — Telephone Encounter (Signed)
Called patient but unable to leave a message to call back.

## 2019-06-20 NOTE — Telephone Encounter (Signed)
Called to complete TCM call. Line keeps ringing busy. Need to try again later.

## 2019-06-20 NOTE — Telephone Encounter (Signed)
Ron (DPR signed) left v/m that when pt was discharged from hospital Ron was not able to go and his sister was with pt when discharged and she did not ask any questions. Ron said they had asked at hospital about urine specimen results and were never given an answer. Ron wants to know if Dr Glori Bickers can find out urinalysis results while pt was in hospital because that was supposed to let them know if UTI was cleared. Ron request cb.

## 2019-06-20 NOTE — Telephone Encounter (Signed)
UA was done- it did not note any bacteria -which is re assuring  Culture was not done (I expect for that reason)

## 2019-06-21 NOTE — Telephone Encounter (Signed)
Transition Care Management Follow-up Telephone Call   Date discharged? 06/19/2019   How have you been since you were released from the hospital? Spoke with patient's son, Ron, ok per DPR on file.  I asked how patient is doing and he said "Patient always says she is fine." she does have some pain under the rib cage probably from the fall she had son thinks. Son has set up evaluation with sitter service-not sure of the name, to help with medication management and making sure patient is not skipping doses and takes everything as instructed, help with been more steady on her feet, son has fixed the bathroom to be safer for the patient. Patient's daughter is there with patient right now.   Do you understand why you were in the hospital? yes   Do you understand the discharge instructions? yes   Where were you discharged to? Home with family   Items Reviewed:  Medications reviewed: yes-has stopped diuretics as instructed.  Allergies reviewed: yes  Dietary changes reviewed: yes  Referrals reviewed: yes- son states that they have Service come out to evaluate patient tomorrow-06/22/2019-to try and set up a sitter for the patient to be there with her during the day. Assessment will be done for patient's needs.   Functional Questionnaire:   Activities of Daily Living (ADLs):   She states they are independent in the following: not at this time. States they require assistance with the following: ambulation, fixing medication, fixing food, bathing, toileting (son installed a raised potty seat for patient), dressing, hygiene.   Any transportation issues/concerns?: Not sure yet, the services that are coming to access patient should be able to provide this. If not then daughter can take patient where needed.   Any patient concerns? Not at this time   Confirmed importance and date/time of follow-up visits scheduled yes  Provider Appointment booked with Dr Glori Bickers on 06/29/2019  Confirmed  with patient if condition begins to worsen call PCP or go to the ER.  Patient was given the office number and encouraged to call back with question or concerns.  : yes

## 2019-06-21 NOTE — Telephone Encounter (Signed)
Ron advised.

## 2019-06-29 ENCOUNTER — Encounter: Payer: Self-pay | Admitting: Family Medicine

## 2019-06-29 ENCOUNTER — Other Ambulatory Visit: Payer: Self-pay

## 2019-06-29 ENCOUNTER — Ambulatory Visit (INDEPENDENT_AMBULATORY_CARE_PROVIDER_SITE_OTHER): Payer: PPO | Admitting: Family Medicine

## 2019-06-29 VITALS — BP 122/70 | HR 85 | Temp 97.3°F | Ht 65.5 in | Wt 250.5 lb

## 2019-06-29 DIAGNOSIS — I10 Essential (primary) hypertension: Secondary | ICD-10-CM

## 2019-06-29 DIAGNOSIS — I872 Venous insufficiency (chronic) (peripheral): Secondary | ICD-10-CM

## 2019-06-29 DIAGNOSIS — R55 Syncope and collapse: Secondary | ICD-10-CM | POA: Diagnosis not present

## 2019-06-29 DIAGNOSIS — R2689 Other abnormalities of gait and mobility: Secondary | ICD-10-CM | POA: Diagnosis not present

## 2019-06-29 NOTE — Assessment & Plan Note (Signed)
With pedal edema  No longer on diuretic Recommend she wear her support stockings

## 2019-06-29 NOTE — Assessment & Plan Note (Signed)
With falls vs syncope at home Recently hospitalized  Reviewed hospital records, lab results and studies in detail  Ref to home PT done

## 2019-06-29 NOTE — Assessment & Plan Note (Signed)
Recently hosp with cardiac w/u -very re assuring  Unsure in retrospect if these events were from syncope or falls  Reviewed hospital records, lab results and studies in detail   Now doing much better  Poor balance / ? Of some occ vertiginous symptoms  May consider return to neurology if this re occurs  Studies/labs reviewed  Agreed to ref to PT for balance and gait training  Has home care during the day now -feels safer Disc with pt and her son

## 2019-06-29 NOTE — Assessment & Plan Note (Signed)
bp is still well controlled after her diuretic was stopped in the hospital  Some pedal edema-disc use of support stockings and leg elevation  Will continue to monitor

## 2019-06-29 NOTE — Patient Instructions (Addendum)
Use your walker at all times  Eat regular meals and drink fluids   Wear you support hose - let us know how it goes When you sit-do elevate feet   Our office will call you about a physical therapy referral  No driving for now

## 2019-06-29 NOTE — Progress Notes (Signed)
Subjective:    Patient ID: Hannah Duran, female    DOB: [birth year], 85 y.o.   MRN: [id number]  HPI  Pt presents for f/u of hosp from 10/18 to 06/19/19 for syncope  One in chair at [facility] a Friday She did not tell anyone -pulled herself up by crawling to the sink  The next day family found her on the floor in the kitchen  Family could not get her up - but she again pulled herself up   Not definitely LOC-could have been falls Pt has some dementia   Did mention feeling like her "inner ear" has been acting up  Meclizine 25 mg tends to help   Pt resented to ER after 2 syncopal episodes Nl cxr and CT chest and tropnin and EKG neg  (did have mild elevated CK) bp was elevated   She was admitted for syncopal w/u  This was negative  Saw cardiologist and no further recommendations or changes  No h/o fainting in the past   Unwitnessed  Thinks she was unconscious for several hours    Dg Chest 2 View  Result Date: 06/18/2019 CLINICAL DATA:  Acute LEFT chest and rib pain following fall. Initial encounter. EXAM: CHEST - 2 VIEW COMPARISON:  None. FINDINGS: Mild cardiomegaly is noted. Mild LEFT basilar atelectasis versus scarring is noted. There is no evidence of focal airspace disease, pulmonary edema, suspicious pulmonary nodule/mass, pleural effusion, or pneumothorax. No acute bony abnormalities are identified. IMPRESSION: 1. Mild LEFT basilar atelectasis/scarring. 2. Mild cardiomegaly. Electronically Signed   By: Margarette Canada M.D.   On: 06/18/2019 14:38   Ct Head Wo Contrast  Result Date: 06/18/2019 CLINICAL DATA:  Status post fall EXAM: CT HEAD WITHOUT CONTRAST TECHNIQUE: Contiguous axial images were obtained from the base of the skull through the vertex without intravenous contrast. COMPARISON:  MRI brain dated 03/16/2019. CT head dated 07/25/2018. FINDINGS: Brain: No evidence of acute infarction, hemorrhage, hydrocephalus, extra-axial collection or mass lesion/mass effect.  Subcortical white matter and periventricular small vessel ischemic changes. Right frontal meningioma (series 4/image 13), benign. Vascular: Intracranial atherosclerosis. Skull: Normal. Negative for fracture or focal lesion. Sinuses/Orbits: The visualized paranasal sinuses are essentially clear. The mastoid air cells are unopacified. Other: None. IMPRESSION: No evidence of acute intracranial abnormality. Small vessel ischemic changes.  Right frontal meningioma, benign. Electronically Signed   By: Julian Hy M.D.   On: 06/18/2019 15:45   Ct Chest Wo Contrast  Result Date: 06/18/2019 CLINICAL DATA:  85 year old female with trauma and concern for rib fracture. Patient is having pain in the left side of the chest. EXAM: CT CHEST WITHOUT CONTRAST TECHNIQUE: Multidetector CT imaging of the chest was performed following the standard protocol without IV contrast. COMPARISON:  Chest radiograph dated 06/18/2019 FINDINGS: Evaluation of this exam is limited in the absence of intravenous contrast. Cardiovascular: Borderline cardiomegaly. No pericardial effusion. Coronary vascular calcifications noted. Mild atherosclerotic calcification of the thoracic aorta. The central pulmonary arteries are grossly unremarkable on this noncontrast CT. Mediastinum/Nodes: No hilar or mediastinal adenopathy. Small hiatal hernia. The esophagus and the thyroid gland are grossly unremarkable. No mediastinal fluid collection. Lungs/Pleura: There is a 4 mm left lower lobe subpleural nodule (series 3 image 91). The lungs are otherwise clear. There is no pleural effusion or pneumothorax. The central airways are patent. Upper Abdomen: Bilateral renal hypodense lesions which are not characterized on this non-contrast CT and new or increased in size since the CT angiogram of 01/22/2012. Further evaluation with ultrasound on  a nonemergent basis recommended. There is scattered colonic diverticula. The visualized upper abdomen is otherwise  unremarkable. Musculoskeletal: Degenerative changes of the spine. Sclerotic area in the T8 vertebra, indeterminate, likely benign. Correlation with history of primary no known malignancy recommended. No acute osseous pathology. No displaced rib fractures. IMPRESSION: 1. No acute/traumatic intrathoracic pathology. No displaced rib fractures. 2. Aortic Atherosclerosis (ICD10-I70.0). Electronically Signed   By: Anner Crete M.D.   On: 06/18/2019 15:54   Ct Cervical Spine Wo Contrast  Result Date: 06/18/2019 CLINICAL DATA:  84 year old female with history of trauma from a fall. Evaluate for cervical spine fracture. EXAM: CT CERVICAL SPINE WITHOUT CONTRAST TECHNIQUE: Multidetector CT imaging of the cervical spine was performed without intravenous contrast. Multiplanar CT image reconstructions were also generated. COMPARISON:  No priors. FINDINGS: Alignment: Reversal of normal cervical lordosis centered at the level of C4, likely chronic and degenerative. Alignment is otherwise anatomic. Skull base and vertebrae: No acute fracture. No primary bone lesion or focal pathologic process. Soft tissues and spinal canal: No prevertebral fluid or swelling. No visible canal hematoma. Disc levels: Multilevel degenerative disc disease, most pronounced at C4-C5, C5-C6 and C6-C7. Moderate multilevel facet arthropathy. Upper chest: Negative. Other: None. IMPRESSION: 1. No evidence of significant acute traumatic injury to the cervical spine. 2. Multilevel degenerative disc disease and cervical spondylosis, as above. Electronically Signed   By: Vinnie Langton M.D.   On: 06/18/2019 14:27    Lab Results  Component Value Date   CREATININE 0.82 06/19/2019   BUN 16 06/19/2019   NA 143 06/19/2019   K 3.9 06/19/2019   CL 107 06/19/2019   CO2 27 06/19/2019   Lab Results  Component Value Date   ALT 24 06/18/2019   AST 40 06/18/2019   ALKPHOS 89 06/18/2019   BILITOT 1.0 06/18/2019   Lab Results  Component Value Date    [id number] (H) 06/19/2019   TROPONINI <0.03 07/24/2018   Neg UA  Echocardiogram w/o acute findings  Her diuretic medicines were stopped    Today: Wt Readings from Last 3 Encounters:  06/29/19 250 lb 8 oz (113.6 kg)  06/18/19 253 lb 1.4 oz (114.8 kg)  06/05/19 253 lb 1 oz (114.8 kg)   41.05 kg/m   BP Readings from Last 3 Encounters:  06/29/19 122/70  06/19/19 (!) 147/71  06/05/19 130/75   Pulse Readings from Last 3 Encounters:  06/29/19 85  06/19/19 72  06/05/19 69   Has taken low dose meclizine prn  Not nearly as woozy but still feels like her inner ear is acting up  At times she feels like her head is spinning  ? If positional Feels a bit unsure getting out of a chair (was previously getting up too quickly- and falling to the R side)   Son worries that meclizine makes her sleepy and could also cause a fall   Has people staying with her in the day time She was staying up late at night -- now doing well  She is in bed before caregivers leave   Patient Active Problem List   Diagnosis Date Noted   Poor balance 06/29/2019   Syncope 06/18/2019   Vitamin B12 deficiency 03/29/2019   Change in mental status 01/05/2019   Visual hallucinations 01/05/2019   History of recurrent UTIs 01/05/2019   Screening mammogram, encounter for 05/31/2018   Encounter for screening mammogram for breast cancer 05/28/2017   Routine general medical examination at a health care facility 05/19/2016   Ankle edema  05/01/2015   Venous (peripheral) insufficiency 05/01/2015   Encounter for Medicare annual wellness exam 06/02/2013   Internal hemorrhoid 06/21/2012   Morbid obesity ([place])    HTN (hypertension)    Hyperlipidemia associated with type 2 diabetes mellitus ([place])    Diabetes type 2, controlled ([place])    Past Medical History:  Diagnosis Date   Anxiety    Congenital foot deformity    Dermatophytosis    Hallucinations    HLD (hyperlipidemia)    HTN  (hypertension)    Hyperglycemia    Irregular heart rhythm    pt unsure of what type; followed by PCP only   Ischemic colitis ([place]) 7/13   on colonoscopy   Memory loss    Obesity    Rotator cuff syndrome    Past Surgical History:  Procedure Laterality Date   CATARACT EXTRACTION W/PHACO Right 09/09/2016   Procedure: CATARACT EXTRACTION PHACO AND INTRAOCULAR LENS PLACEMENT ([place]);  Surgeon: Leandrew Koyanagi, MD;  Location: [facility];  Service: Ophthalmology;  Laterality: Right;  right   CATARACT EXTRACTION W/PHACO Left 12/16/2016   Procedure: CATARACT EXTRACTION PHACO AND INTRAOCULAR LENS PLACEMENT ([place])  Left;  Surgeon: Leandrew Koyanagi, MD;  Location: [facility];  Service: Ophthalmology;  Laterality: Left;   HUMERUS FRACTURE SURGERY  10/1997   TOTAL ABDOMINAL HYSTERECTOMY  1980's   due to menometrorrhagia   Social History   Tobacco Use   Smoking status: Never Smoker   Smokeless tobacco: Never Used  Substance Use Topics   Alcohol use: No   Drug use: No   Family History  Problem Relation Age of Onset   Stroke Mother    Parkinsonism Mother    Hypertension Mother    Alzheimer's disease Mother    Cancer Father        bladder/prostate   Breast cancer Neg Hx    Allergies  Allergen Reactions   Penicillins     REACTION: Rash   Sulfamethoxazole-Trimethoprim     rash   Amoxicillin Rash   Current Outpatient Medications on File Prior to Visit  Medication Sig Dispense Refill   ALPRAZolam (XANAX) 0.5 MG tablet Take 1 tablet (0.5 mg total) by mouth at bedtime as needed for anxiety. 30 tablet 5   atorvastatin (LIPITOR) 20 MG tablet Take 1 tablet (20 mg total) by mouth daily. 90 tablet 3   cyanocobalamin (,VITAMIN B-12,) 1000 MCG/ML injection Inject 1,000 mcg into the muscle every 8 (eight) weeks.      losartan (COZAAR) 25 MG tablet Take 1 tablet (25 mg total) by mouth daily. 30 tablet 0   metFORMIN (GLUCOPHAGE) 500 MG tablet Take 1  tablet (500 mg total) by mouth 2 (two) times daily with a meal. 180 tablet 3   QUEtiapine (SEROQUEL) 25 MG tablet Take 2 tablets (50 mg total) by mouth at bedtime. (Patient taking differently: Take 1-1.5 mg by mouth at bedtime. ) 60 tablet 11   No current facility-administered medications on file prior to visit.     Review of Systems  Constitutional: Negative for activity change, appetite change, fatigue, fever and unexpected weight change.  HENT: Negative for congestion, ear pain, rhinorrhea, sinus pressure and sore throat.   Eyes: Negative for pain, redness and visual disturbance.  Respiratory: Negative for cough, shortness of breath and wheezing.   Cardiovascular: Negative for chest pain and palpitations.  Gastrointestinal: Negative for abdominal pain, blood in stool, constipation and diarrhea.  Endocrine: Negative for polydipsia and polyuria.  Genitourinary: Negative for dysuria, frequency and  urgency.  Musculoskeletal: Negative for arthralgias, back pain and myalgias.  Skin: Negative for pallor and rash.  Allergic/Immunologic: Negative for environmental allergies.  Neurological: Positive for dizziness, syncope and weakness. Negative for tremors, seizures, facial asymmetry, speech difficulty, numbness and headaches.       Generalized weakness-improving Poor balance  Hematological: Negative for adenopathy. Does not bruise/bleed easily.  Psychiatric/Behavioral: Positive for hallucinations. Negative for decreased concentration, dysphoric mood, self-injury and sleep disturbance. The patient is not nervous/anxious.        Baseline hallucinations        Objective:   Physical Exam Constitutional:      General: She is not in acute distress.    Appearance: Normal appearance. She is well-developed. She is obese. She is not ill-appearing or diaphoretic.  HENT:     Head: Normocephalic and atraumatic.     Right Ear: Tympanic membrane and ear canal normal.     Left Ear: Tympanic membrane and  ear canal normal.     Ears:     Comments: Partial cerumen obst bilat    Nose: Nose normal.     Mouth/Throat:     Mouth: Mucous membranes are moist.  Eyes:     General: No scleral icterus.    Extraocular Movements: Extraocular movements intact.     Conjunctiva/sclera: Conjunctivae normal.     Pupils: Pupils are equal, round, and reactive to light.     Comments: No nystagmus  Neck:     Musculoskeletal: Normal range of motion and neck supple. No neck rigidity or muscular tenderness.     Thyroid: No thyromegaly.     Vascular: No carotid bruit or JVD.  Cardiovascular:     Rate and Rhythm: Normal rate and regular rhythm.     Pulses: Normal pulses.     Heart sounds: Normal heart sounds. No gallop.   Pulmonary:     Effort: Pulmonary effort is normal. No respiratory distress.     Breath sounds: Normal breath sounds. No wheezing or rales.  Abdominal:     General: Bowel sounds are normal. There is no distension or abdominal bruit.     Palpations: Abdomen is soft. There is no mass.     Tenderness: There is no abdominal tenderness.  Musculoskeletal:     Right lower leg: Edema present.     Left lower leg: Edema present.     Comments: One plus pitting edema  Lymphadenopathy:     Cervical: No cervical adenopathy.  Skin:    General: Skin is warm and dry.     Findings: No rash.  Neurological:     Mental Status: She is alert. Mental status is at baseline.     Cranial Nerves: No cranial nerve deficit.     Sensory: No sensory deficit.     Deep Tendon Reflexes: Reflexes are normal and symmetric. Reflexes normal.     Comments: No focal weakness Gait is slow but steady with her walker (not shuffling) No cogwheel rigidity or tremor   Psychiatric:        Mood and Affect: Mood normal.     Comments: Pleasant  Not confused Answers questions appropriately           Assessment & Plan:   Problem List Items Addressed This Visit      Cardiovascular and Mediastinum   HTN (hypertension)     bp is still well controlled after her diuretic was stopped in the hospital  Some pedal edema-disc use of support stockings and leg elevation  Will continue to monitor      Venous (peripheral) insufficiency    With pedal edema  No longer on diuretic Recommend she wear her support stockings      Syncope - Primary    Recently hosp with cardiac w/u -very re assuring  Unsure in retrospect if these events were from syncope or falls  Reviewed hospital records, lab results and studies in detail   Now doing much better  Poor balance / ? Of some occ vertiginous symptoms  May consider return to neurology if this re occurs  Studies/labs reviewed  Agreed to ref to PT for balance and gait training  Has home care during the day now -feels safer Disc with pt and her son          Other   Poor balance    With falls vs syncope at home Recently hospitalized  Reviewed hospital records, lab results and studies in detail  Ref to home PT done      Relevant Orders   Ambulatory referral to Physical Therapy

## 2019-07-24 ENCOUNTER — Other Ambulatory Visit: Payer: Self-pay | Admitting: *Deleted

## 2019-07-24 MED ORDER — LOSARTAN POTASSIUM 25 MG PO TABS: 25.0000 mg | ORAL_TABLET | Freq: Every day | ORAL | 3 refills | Status: DC

## 2019-07-24 NOTE — Telephone Encounter (Signed)
Rx was changed to 25 mgs at hospital. Last Rx from [place] was 50 mgs, is it okay to refill, [place]

## 2019-08-08 ENCOUNTER — Telehealth: Payer: Self-pay | Admitting: *Deleted

## 2019-08-08 ENCOUNTER — Other Ambulatory Visit: Payer: Self-pay

## 2019-08-08 ENCOUNTER — Ambulatory Visit (INDEPENDENT_AMBULATORY_CARE_PROVIDER_SITE_OTHER): Payer: PPO | Admitting: *Deleted

## 2019-08-08 DIAGNOSIS — I10 Essential (primary) hypertension: Secondary | ICD-10-CM

## 2019-08-08 DIAGNOSIS — E538 Deficiency of other specified B group vitamins: Secondary | ICD-10-CM | POA: Diagnosis not present

## 2019-08-08 MED ORDER — FUROSEMIDE 20 MG PO TABS: ORAL_TABLET | ORAL | 3 refills | Status: DC

## 2019-08-08 MED ORDER — POTASSIUM CHLORIDE ER 10 MEQ PO TBCR: EXTENDED_RELEASE_TABLET | ORAL | 3 refills | Status: DC

## 2019-08-08 MED ORDER — CYANOCOBALAMIN 1000 MCG/ML IJ SOLN
1000.0000 ug | Freq: Once | INTRAMUSCULAR | Status: AC
  Administered 2019-08-08: 14:00:00 1000 ug via INTRAMUSCULAR

## 2019-08-08 NOTE — Telephone Encounter (Signed)
She was taken off all of her diuretic agents in the hospital after the faint/fall due to low BP and low K.  Do they know if her bp has been high or low (normal at her last visit with me) Is she elevating her feet?  Is she wearing support stockings? Does the swelling hurt or is it just uncomfortable?  Any chest pain or shortness of breath?  Any more syncope/fainting or dizziness ? How many days a week does she/family estimate she may need to take a lasix pill?    Let me know , thanks

## 2019-08-08 NOTE — Progress Notes (Signed)
Per orders of Dr. Tower, injection of Vitamin B12 given by [place], Donna Simpson. Patient tolerated injection well.     

## 2019-08-08 NOTE — Telephone Encounter (Signed)
Sent to [address] in [place] Lab visit scheduled - 09/14/2019 at 7:40am to [employer].   Nothing further needed.

## 2019-08-08 NOTE — Telephone Encounter (Signed)
Hannah Duran came in today for her B12 injection.  While outside doing the nurse visit, they states Hannah Duran is having swelling in her ankles and legs since her fall in October.  They are asking if Dr. Glori Bickers would consider prescribing lasix and potassium to use on occasion for swelling.  I advised I would send Dr. Glori Bickers a phone note to see what she recommends.  She was also asking when she was due for her next B12 injection.  She will be due in 8 weeks which falls on 10/03/2019.  Please also advise her of this when you call about the swelling.

## 2019-08-08 NOTE — Telephone Encounter (Signed)
I wrote for furosemide (lasix) 20 mg to take twice weekly  And also low dose K to take twice weekly on the same days as furosemide   I pended them to send to pharmacy of choice   Continue leg elevation when sitting and support stockings   Please schedule lab in approx 1 mo for bmet so I can check electrolytes (potassium) to make sure it is not too high or low with this dosing

## 2019-08-08 NOTE — Telephone Encounter (Signed)
Pt states that her BP has not been taken since leaving the hospital.  Pt is elevating feet all day - keeping them elevated while sleeping.  Pt is icing her feet as well. Wearing support stockings during the day.  No real pain or discomfort from the swelling.  Denies chest pain or SOB Denies syncope spells, fainting or dizziness.   Pt states that with the way her feet are swelling even with elevating them she feels she may need to take Lasix a couple times a week.

## 2019-09-12 ENCOUNTER — Telehealth: Payer: Self-pay

## 2019-09-12 NOTE — Telephone Encounter (Signed)
LVM w COVID screen and back lab info 1.12.2021 TLJ

## 2019-09-14 ENCOUNTER — Other Ambulatory Visit: Payer: Self-pay

## 2019-09-14 ENCOUNTER — Other Ambulatory Visit: Payer: PPO

## 2019-09-14 ENCOUNTER — Other Ambulatory Visit (INDEPENDENT_AMBULATORY_CARE_PROVIDER_SITE_OTHER): Payer: PPO

## 2019-09-14 DIAGNOSIS — I10 Essential (primary) hypertension: Secondary | ICD-10-CM

## 2019-09-14 LAB — BASIC METABOLIC PANEL
BUN: 15 mg/dL (ref 6–23)
CO2: 31 mEq/L (ref 19–32)
Calcium: 9.1 mg/dL (ref 8.4–10.5)
Chloride: 103 mEq/L (ref 96–112)
Creatinine, Ser: 0.76 mg/dL (ref 0.40–1.20)
GFR: 72.61 mL/min (ref >60.00)
Glucose, Bld: 146 mg/dL — ABNORMAL HIGH (ref 70–99)
Potassium: 3.5 mEq/L (ref 3.5–5.1)
Sodium: 141 mEq/L (ref 135–145)

## 2019-09-15 ENCOUNTER — Encounter: Payer: Self-pay | Admitting: *Deleted

## 2019-09-21 ENCOUNTER — Ambulatory Visit: Payer: PPO | Admitting: Neurology

## 2019-10-26 ENCOUNTER — Encounter: Payer: Self-pay | Admitting: Internal Medicine

## 2019-10-26 ENCOUNTER — Ambulatory Visit (INDEPENDENT_AMBULATORY_CARE_PROVIDER_SITE_OTHER): Payer: PPO | Admitting: Internal Medicine

## 2019-10-26 ENCOUNTER — Other Ambulatory Visit: Payer: Self-pay

## 2019-10-26 ENCOUNTER — Telehealth: Payer: Self-pay

## 2019-10-26 VITALS — BP 124/76 | HR 82 | Temp 98.7°F | Wt 246.0 lb

## 2019-10-26 DIAGNOSIS — R3915 Urgency of urination: Secondary | ICD-10-CM

## 2019-10-26 DIAGNOSIS — N390 Urinary tract infection, site not specified: Secondary | ICD-10-CM | POA: Diagnosis not present

## 2019-10-26 LAB — POC URINALSYSI DIPSTICK (AUTOMATED)
Bilirubin, UA: NEGATIVE
Glucose, UA: NEGATIVE
Nitrite, UA: POSITIVE
Protein, UA: POSITIVE — AB
Spec Grav, UA: 1.025 (ref 1.010–1.025)
Urobilinogen, UA: 0.2 E.U./dL
pH, UA: 6 (ref 5.0–8.0)

## 2019-10-26 MED ORDER — NITROFURANTOIN MONOHYD MACRO 100 MG PO CAPS: 100.0000 mg | ORAL_CAPSULE | Freq: Two times a day (BID) | ORAL | 0 refills | Status: DC

## 2019-10-26 NOTE — Progress Notes (Signed)
HPI  Pt presents to the clinic today with c/o urinary urgency. This started yesterday. She denies frequency, dysuria, blood in her urine. She denies fever, chills, nausea or low back pain. She denies vaginal issues. She has a history of recurrent UTI's, last treated 06/2019. She has not taken anything OTC.   Review of Systems  Past Medical History:  Diagnosis Date  . Anxiety   . Congenital foot deformity   . Dermatophytosis   . Hallucinations   . HLD (hyperlipidemia)   . HTN (hypertension)   . Hyperglycemia   . Irregular heart rhythm    pt unsure of what type; followed by PCP only  . Ischemic colitis ([place]) 7/13   on colonoscopy  . Memory loss   . Obesity   . Rotator cuff syndrome     Family History  Problem Relation Age of Onset  . Stroke Mother   . Parkinsonism Mother   . Hypertension Mother   . Alzheimer's disease Mother   . Cancer Father        bladder/prostate  . Breast cancer Neg Hx     Social History   Socioeconomic History  . Marital status: Widowed    Spouse name: Not on file  . Number of children: 2  . Years of education: 50  . Highest education level: High school graduate  Occupational History  . Occupation: retired Network engineer  Tobacco Use  . Smoking status: Never Smoker  . Smokeless tobacco: Never Used  Substance and Sexual Activity  . Alcohol use: No  . Drug use: No  . Sexual activity: Never  Other Topics Concern  . Not on file  Social History Narrative   Lives at home alone.   Right-handed.   One cup coffee per day.   Social Determinants of Health   Financial Resource Strain: Low Risk   . Difficulty of Paying Living Expenses: Not hard at all  Food Insecurity: No Food Insecurity  . Worried About Charity fundraiser in the Last Year: Never true  . Ran Out of Food in the Last Year: Never true  Transportation Needs: No Transportation Needs  . Lack of Transportation (Medical): No  . Lack of Transportation (Non-Medical): No  Physical  Activity: Inactive  . Days of Exercise per Week: 0 days  . Minutes of Exercise per Session: 0 min  Stress: No Stress Concern Present  . Feeling of Stress : Not at all  Social Connections:   . Frequency of Communication with Friends and Family: Not on file  . Frequency of Social Gatherings with Friends and Family: Not on file  . Attends Religious Services: Not on file  . Active Member of Clubs or Organizations: Not on file  . Attends Archivist Meetings: Not on file  . Marital Status: Not on file  Intimate Partner Violence: Not At Risk  . Fear of Current or Ex-Partner: No  . Emotionally Abused: No  . Physically Abused: No  . Sexually Abused: No    Allergies  Allergen Reactions  . Penicillins     REACTION: Rash  . Sulfamethoxazole-Trimethoprim     rash  . Amoxicillin Rash     Constitutional: Denies fever, malaise, fatigue, headache or abrupt weight changes.   GU: Pt reports urgency. Denies frequency, dysuria, burning sensation, blood in urine, odor or discharge.   No other specific complaints in a complete review of systems (except as listed in HPI above).    Objective:   Physical Exam  BP  124/76   Pulse 82   Temp 98.7 F (37.1 C) (Temporal)   Wt 246 lb (111.6 kg)   LMP 08/31/1980   SpO2 97%   BMI 40.31 kg/m   Wt Readings from Last 3 Encounters:  06/29/19 250 lb 8 oz (113.6 kg)  06/18/19 253 lb 1.4 oz (114.8 kg)  06/05/19 253 lb 1 oz (114.8 kg)    General: Appears her stated age, obese, in NAD. Abdomen: Soft and nontender. Normal bowel sounds. No distention or masses noted.  No CVA tenderness.        Assessment & Plan:   Urgency:   Urinalysis: 1+ leuks, pos nitirites, trace blood Will send urine culture eRx sent if for Macrobid 100 mg BID x 5 days Drink plenty of fluids  RTC as needed or if symptoms persist. Webb Silversmith, NP This visit occurred during the SARS-CoV-2 public health emergency.  Safety protocols were in place, including  screening questions prior to the visit, additional usage of staff PPE, and extensive cleaning of exam room while observing appropriate contact time as indicated for disinfecting solutions.

## 2019-10-26 NOTE — Addendum Note (Signed)
Addended by: Lurlean Nanny on: 10/26/2019 04:40 PM   Modules accepted: Orders

## 2019-10-26 NOTE — Telephone Encounter (Signed)
Pt's son, Chriss Czar, came to the clinic today reporting he thinks pt has a UTI. He reports dark, foul smelling urine and hallucinations. Ron denies any hematuria and he is not sure if there is dysuria or frequency. Advised pt should have an apt. Ron agreed.  Scheduled pt to see Threasa Beards, NP, due to Dr. Marliss Coots schedule being full.

## 2019-10-26 NOTE — Patient Instructions (Signed)
Urinary Tract Infection, Adult A urinary tract infection (UTI) is an infection of any part of the urinary tract. The urinary tract includes:  The kidneys.  The ureters.  The bladder.  The urethra. These organs make, store, and get rid of pee (urine) in the body. What are the causes? This is caused by germs (bacteria) in your genital area. These germs grow and cause swelling (inflammation) of your urinary tract. What increases the risk? You are more likely to develop this condition if:  You have a small, thin tube (catheter) to drain pee.  You cannot control when you pee or poop (incontinence).  You are female, and: ? You use these methods to prevent pregnancy:  A medicine that kills sperm (spermicide).  A device that blocks sperm (diaphragm). ? You have low levels of a female hormone (estrogen). ? You are pregnant.  You have genes that add to your risk.  You are sexually active.  You take antibiotic medicines.  You have trouble peeing because of: ? A prostate that is bigger than normal, if you are female. ? A blockage in the part of your body that drains pee from the bladder (urethra). ? A kidney stone. ? A nerve condition that affects your bladder (neurogenic bladder). ? Not getting enough to drink. ? Not peeing often enough.  You have other conditions, such as: ? Diabetes. ? A weak disease-fighting system (immune system). ? Sickle cell disease. ? Gout. ? Injury of the spine. What are the signs or symptoms? Symptoms of this condition include:  Needing to pee right away (urgently).  Peeing often.  Peeing small amounts often.  Pain or burning when peeing.  Blood in the pee.  Pee that smells bad or not like normal.  Trouble peeing.  Pee that is cloudy.  Fluid coming from the vagina, if you are female.  Pain in the belly or lower back. Other symptoms include:  Throwing up (vomiting).  No urge to eat.  Feeling mixed up (confused).  Being tired  and grouchy (irritable).  A fever.  Watery poop (diarrhea). How is this treated? This condition may be treated with:  Antibiotic medicine.  Other medicines.  Drinking enough water. Follow these instructions at home:  Medicines  Take over-the-counter and prescription medicines only as told by your doctor.  If you were prescribed an antibiotic medicine, take it as told by your doctor. Do not stop taking it even if you start to feel better. General instructions  Make sure you: ? Pee until your bladder is empty. ? Do not hold pee for a long time. ? Empty your bladder after sex. ? Wipe from front to back after pooping if you are a female. Use each tissue one time when you wipe.  Drink enough fluid to keep your pee pale yellow.  Keep all follow-up visits as told by your doctor. This is important. Contact a doctor if:  You do not get better after 1-2 days.  Your symptoms go away and then come back. Get help right away if:  You have very bad back pain.  You have very bad pain in your lower belly.  You have a fever.  You are sick to your stomach (nauseous).  You are throwing up. Summary  A urinary tract infection (UTI) is an infection of any part of the urinary tract.  This condition is caused by germs in your genital area.  There are many risk factors for a UTI. These include having a small, thin   tube to drain pee and not being able to control when you pee or poop.  Treatment includes antibiotic medicines for germs.  Drink enough fluid to keep your pee pale yellow. This information is not intended to replace advice given to you by your health care provider. Make sure you discuss any questions you have with your health care provider. Document Revised: 08/04/2018 Document Reviewed: 02/24/2018 Elsevier Patient Education  2020 [employer]  

## 2019-10-28 LAB — URINE CULTURE
MICRO NUMBER:: 10188713
SPECIMEN QUALITY:: ADEQUATE

## 2019-11-01 ENCOUNTER — Telehealth: Payer: Self-pay

## 2019-11-01 NOTE — Telephone Encounter (Signed)
error 

## 2019-11-16 ENCOUNTER — Ambulatory Visit: Payer: PPO | Attending: Internal Medicine

## 2019-11-16 DIAGNOSIS — Z23 Encounter for immunization: Secondary | ICD-10-CM

## 2019-11-16 NOTE — Progress Notes (Signed)
   Covid-19 Vaccination Clinic  Name:  Hannah Duran    MRN: [id number] DOB: 1934-02-22  11/16/2019  Ms. Baugher was observed post Covid-19 immunization for 15 minutes without incident. She was provided with Vaccine Information Sheet and instruction to access the V-Safe system.   Ms. Benney was instructed to call 911 with any severe reactions post vaccine: Marland Kitchen Difficulty breathing  . Swelling of face and throat  . A fast heartbeat  . A bad rash all over body  . Dizziness and weakness   Immunizations Administered    Name Date Dose VIS Date Route   Pfizer COVID-19 Vaccine 11/16/2019 12:05 AM 0.3 mL 08/11/2019 Intramuscular   Manufacturer: [place]   Lot: [id number]   [place]: [id number]

## 2019-11-30 ENCOUNTER — Ambulatory Visit: Payer: PPO | Admitting: Neurology

## 2019-12-11 ENCOUNTER — Other Ambulatory Visit: Payer: Self-pay

## 2019-12-11 ENCOUNTER — Ambulatory Visit (INDEPENDENT_AMBULATORY_CARE_PROVIDER_SITE_OTHER): Payer: PPO | Admitting: Family Medicine

## 2019-12-11 ENCOUNTER — Encounter: Payer: Self-pay | Admitting: Family Medicine

## 2019-12-11 VITALS — BP 138/90 | HR 67 | Temp 97.4°F | Ht 65.5 in | Wt 243.2 lb

## 2019-12-11 DIAGNOSIS — N898 Other specified noninflammatory disorders of vagina: Secondary | ICD-10-CM | POA: Insufficient documentation

## 2019-12-11 DIAGNOSIS — R82998 Other abnormal findings in urine: Secondary | ICD-10-CM | POA: Diagnosis not present

## 2019-12-11 DIAGNOSIS — R350 Frequency of micturition: Secondary | ICD-10-CM | POA: Diagnosis not present

## 2019-12-11 LAB — POC URINALSYSI DIPSTICK (AUTOMATED)
Bilirubin, UA: NEGATIVE
Blood, UA: NEGATIVE
Glucose, UA: NEGATIVE
Ketones, UA: NEGATIVE
Nitrite, UA: NEGATIVE
Protein, UA: NEGATIVE
Spec Grav, UA: 1.02 (ref 1.010–1.025)
Urobilinogen, UA: 0.2 E.U./dL
pH, UA: 6 (ref 5.0–8.0)

## 2019-12-11 NOTE — Assessment & Plan Note (Signed)
Pt unsure if darker color on pad is from darker urine or from vag d/c  She declines vaginal /pelvic exam today   If no improvement she may need to see gyn

## 2019-12-11 NOTE — Assessment & Plan Note (Signed)
Pt wears a pad due to overactive bladder /incontinence Urine is darker than usual  Sent for cx inst to drink more water and less soda

## 2019-12-11 NOTE — Patient Instructions (Signed)
Your urine has a small amount of white cells - this could indicate infection so I am sending it for a culture  We will call you with a result and treat it if you have a uti   Drink more water, your urine is concentrated (indicating you need more) and that could also look dark on the pad   Watch further for vaginal discharge or other vaginal symptoms like itching or burning   I know you do not want to do a vaginal exam today but if symptoms continue we may want you to see a gynecologist

## 2019-12-11 NOTE — Assessment & Plan Note (Signed)
Encouraged more water intake  This may be reason for color change  Sent for cx (small leukocytes/cloudy urine noted)  Pend that result -will tx if uti  Has hx of frequent utis in past

## 2019-12-11 NOTE — Progress Notes (Signed)
Subjective:    Patient ID: Hannah Duran, female    DOB: August 03, 1934, 86 y.o.   MRN: [id number]  This visit occurred during the SARS-CoV-2 public health emergency.  Safety protocols were in place, including screening questions prior to the visit, additional usage of staff PPE, and extensive cleaning of exam room while observing appropriate contact time as indicated for disinfecting solutions.    HPI Pt presents with urinary symptoms as well as vaginal d/c  Wt Readings from Last 3 Encounters:  12/11/19 243 lb 4 oz (110.3 kg)  10/26/19 246 lb (111.6 kg)  06/29/19 250 lb 8 oz (113.6 kg)   39.86 kg/m   She has noted some dark discoloration on pad   Nothing when she wipes  Does   No pain to urinate  No more frequency than usual  Has urgency  Not tired or nauseated   Does not drink a lot of water  Drinks sun kist diet  Thinks fluids overall  Not more thirsty   No vaginal itching or burning or discomfort  Has had a hysterectomy   Results for orders placed or performed in visit on 12/11/19  POCT Urinalysis Dipstick (Automated)  Result Value Ref Range   Color, UA Dark Yellow    Clarity, UA Cloudy    Glucose, UA Negative Negative   Bilirubin, UA Negative    Ketones, UA Negative    Spec Grav, UA 1.020 1.010 - 1.025   Blood, UA Negative    pH, UA 6.0 5.0 - 8.0   Protein, UA Negative Negative   Urobilinogen, UA 0.2 0.2 or 1.0 E.U./dL   Nitrite, UA Negaitve    Leukocytes, UA Small (1+) (A) Negative    No pelvic pain  No bladder fullness  Has h/o recurrent utis   She declines any sort of vaginal or gyn exam here today  Does not drive and uses walker due to poor balance  No falls recently  Wishes she could drive and stop using walker   Patient Active Problem List   Diagnosis Date Noted  . Dark urine 12/11/2019  . Vaginal discharge 12/11/2019  . Urine frequency 12/11/2019  . Poor balance 06/29/2019  . Syncope 06/18/2019  . Vitamin B12 deficiency 03/29/2019  .  Change in mental status 01/05/2019  . Visual hallucinations 01/05/2019  . History of recurrent UTIs 01/05/2019  . Screening mammogram, encounter for 05/31/2018  . Encounter for screening mammogram for breast cancer 05/28/2017  . Routine general medical examination at a health care facility 05/19/2016  . Ankle edema 05/01/2015  . Venous (peripheral) insufficiency 05/01/2015  . Encounter for Medicare annual wellness exam 06/02/2013  . Internal hemorrhoid 06/21/2012  . Morbid obesity ([place])   . HTN (hypertension)   . Hyperlipidemia associated with type 2 diabetes mellitus ([place])   . Diabetes type 2, controlled ([place])    Past Medical History:  Diagnosis Date  . Anxiety   . Congenital foot deformity   . Dermatophytosis   . Hallucinations   . HLD (hyperlipidemia)   . HTN (hypertension)   . Hyperglycemia   . Irregular heart rhythm    pt unsure of what type; followed by PCP only  . Ischemic colitis ([place]) 7/13   on colonoscopy  . Memory loss   . Obesity   . Rotator cuff syndrome    Past Surgical History:  Procedure Laterality Date  . CATARACT EXTRACTION W/PHACO Right 09/09/2016   Procedure: CATARACT EXTRACTION PHACO AND INTRAOCULAR LENS PLACEMENT (IOC);  Surgeon:  Leandrew Koyanagi, MD;  Location: [place];  Service: Ophthalmology;  Laterality: Right;  right  . CATARACT EXTRACTION W/PHACO Left 12/16/2016   Procedure: CATARACT EXTRACTION PHACO AND INTRAOCULAR LENS PLACEMENT ([place])  Left;  Surgeon: Leandrew Koyanagi, MD;  Location: [place];  Service: Ophthalmology;  Laterality: Left;  . HUMERUS FRACTURE SURGERY  10/1997  . TOTAL ABDOMINAL HYSTERECTOMY  1980's   due to menometrorrhagia   Social History   Tobacco Use  . Smoking status: Never Smoker  . Smokeless tobacco: Never Used  Substance Use Topics  . Alcohol use: No  . Drug use: No   Family History  Problem Relation Age of Onset  . Stroke Mother   . Parkinsonism Mother   . Hypertension Mother   .  Alzheimer's disease Mother   . Cancer Father        bladder/prostate  . Breast cancer Neg Hx    Allergies  Allergen Reactions  . Penicillins     REACTION: Rash  . Sulfamethoxazole-Trimethoprim     rash  . Amoxicillin Rash   Current Outpatient Medications on File Prior to Visit  Medication Sig Dispense Refill  . ALPRAZolam (XANAX) 0.5 MG tablet Take 1 tablet (0.5 mg total) by mouth at bedtime as needed for anxiety. 30 tablet 5  . atorvastatin (LIPITOR) 20 MG tablet Take 1 tablet (20 mg total) by mouth daily. 90 tablet 3  . cyanocobalamin (,VITAMIN B-12,) 1000 MCG/ML injection Inject 1,000 mcg into the muscle every 8 (eight) weeks.     . furosemide (LASIX) 20 MG tablet Take one pill by mouth twice weekly 24 tablet 3  . losartan (COZAAR) 25 MG tablet Take 1 tablet (25 mg total) by mouth daily. 90 tablet 3  . metFORMIN (GLUCOPHAGE) 500 MG tablet Take 1 tablet (500 mg total) by mouth 2 (two) times daily with a meal. 180 tablet 3  . potassium chloride (KLOR-CON) 10 MEQ tablet Take one pill by mouth once weekly when you take your furosemide 24 tablet 3  . QUEtiapine (SEROQUEL) 25 MG tablet Take 2 tablets (50 mg total) by mouth at bedtime. (Patient taking differently: Take 1-1.5 mg by mouth at bedtime. ) 60 tablet 11   No current facility-administered medications on file prior to visit.     Review of Systems  Constitutional: Negative for activity change, appetite change, fatigue, fever and unexpected weight change.  HENT: Negative for congestion, ear pain, rhinorrhea, sinus pressure and sore throat.   Eyes: Negative for pain, redness and visual disturbance.  Respiratory: Negative for cough, shortness of breath and wheezing.   Cardiovascular: Negative for chest pain and palpitations.  Gastrointestinal: Negative for abdominal pain, blood in stool, constipation and diarrhea.  Endocrine: Negative for polydipsia and polyuria.  Genitourinary: Positive for frequency and vaginal discharge.  Negative for difficulty urinating, dysuria, flank pain, hematuria, urgency, vaginal bleeding and vaginal pain.  Musculoskeletal: Negative for arthralgias, back pain and myalgias.  Skin: Negative for pallor and rash.  Allergic/Immunologic: Negative for environmental allergies.  Neurological: Negative for dizziness, syncope and headaches.  Hematological: Negative for adenopathy. Does not bruise/bleed easily.  Psychiatric/Behavioral: Negative for decreased concentration and dysphoric mood. The patient is not nervous/anxious.        Objective:   Physical Exam Constitutional:      General: She is not in acute distress.    Appearance: Normal appearance. She is well-developed. She is obese. She is not ill-appearing.  HENT:     Head: Normocephalic and atraumatic.  Mouth/Throat:     Mouth: Mucous membranes are moist.  Eyes:     General: No scleral icterus.    Conjunctiva/sclera: Conjunctivae normal.     Pupils: Pupils are equal, round, and reactive to light.  Cardiovascular:     Rate and Rhythm: Normal rate and regular rhythm.     Heart sounds: Normal heart sounds.  Pulmonary:     Effort: Pulmonary effort is normal.     Breath sounds: Normal breath sounds.  Abdominal:     General: Bowel sounds are normal. There is no distension.     Palpations: Abdomen is soft.     Tenderness: There is abdominal tenderness. There is no rebound.     Comments: No cva tenderness No suprapubic tenderness or fullness   Genitourinary:    Comments: Pt declined vaginal exam Musculoskeletal:     Cervical back: Normal range of motion and neck supple.  Lymphadenopathy:     Cervical: No cervical adenopathy.  Skin:    Findings: No rash.  Neurological:     Mental Status: She is alert.     Comments: No focal weakness Steady gait with walker   Psychiatric:        Mood and Affect: Mood normal.           Assessment & Plan:   Problem List Items Addressed This Visit      Other   Dark urine     Encouraged more water intake  This may be reason for color change  Sent for cx (small leukocytes/cloudy urine noted)  Pend that result -will tx if uti  Has hx of frequent utis in past      Relevant Orders   Urine Culture   Vaginal discharge - Primary    Pt unsure if darker color on pad is from darker urine or from vag d/c  She declines vaginal /pelvic exam today   If no improvement she may need to see gyn      Relevant Orders   POCT Urinalysis Dipstick (Automated) (Completed)   Urine frequency    Pt wears a pad due to overactive bladder /incontinence Urine is darker than usual  Sent for cx inst to drink more water and less soda      Relevant Orders   Urine Culture

## 2019-12-12 LAB — URINE CULTURE
MICRO NUMBER:: 10352081
SPECIMEN QUALITY:: ADEQUATE

## 2019-12-13 ENCOUNTER — Ambulatory Visit: Payer: PPO | Attending: Internal Medicine

## 2019-12-13 DIAGNOSIS — Z23 Encounter for immunization: Secondary | ICD-10-CM

## 2019-12-13 NOTE — Progress Notes (Signed)
   Covid-19 Vaccination Clinic  Name:  Hannah Duran    MRN: [id number] DOB: 03-05-1934  12/13/2019  [name] was observed post Covid-19 immunization for 15 minutes without incident. She was provided with Vaccine Information Sheet and instruction to access the V-Safe system.   [name] was instructed to call 911 with any severe reactions post vaccine: Marland Kitchen Difficulty breathing  . Swelling of face and throat  . A fast heartbeat  . A bad rash all over body  . Dizziness and weakness   Immunizations Administered    Name Date Dose VIS Date Route   Pfizer COVID-19 Vaccine 12/13/2019 12:16 PM 0.3 mL 08/11/2019 Intramuscular   Manufacturer: [place]   Lot: [id number]   [place]: [id number]

## 2020-01-18 ENCOUNTER — Ambulatory Visit: Payer: PPO | Admitting: Neurology

## 2020-01-18 ENCOUNTER — Other Ambulatory Visit: Payer: Self-pay

## 2020-01-18 ENCOUNTER — Encounter: Payer: Self-pay | Admitting: Neurology

## 2020-01-18 DIAGNOSIS — F0391 Unspecified dementia with behavioral disturbance: Secondary | ICD-10-CM

## 2020-01-18 DIAGNOSIS — F0390 Unspecified dementia without behavioral disturbance: Secondary | ICD-10-CM | POA: Insufficient documentation

## 2020-01-18 MED ORDER — QUETIAPINE FUMARATE 25 MG PO TABS: 25.0000 mg | ORAL_TABLET | Freq: Every day | ORAL | 11 refills | Status: DC

## 2020-01-18 NOTE — Progress Notes (Signed)
PATIENT: Hannah Duran DOB: Oct 11, 1933  REASON FOR VISIT: follow up HISTORY FROM: patient  HISTORY OF PRESENT ILLNESS: Today 01/18/20  HISTORY Hannah Duran is a 86 year old female, seen in request by her primary care physician Dr.Tower for evaluation of visual hallucinations she is accompanied by her son Ron at today's clinic visit on March 08, 2019.   I have reviewed and summarized the referring note from the referring physician.  She had a past medical history of hypertension, hyperlipidemia, diabetes, presented with gradual worsening visual hallucinations since January 2020.  She is a retired Network engineer, she lives alone since her husband passed away in Oct 27, 2014, she has lived in current house for 15 years, she was not able to give me a timeline of the history, she thought she only lived at her current house for 2 years.  Since January 2020, she was noted to have intermittent visual hallucinations, mostly at the nighttime, gradually getting worse, now she has visual hallucinations day and night, most of the hallucinations are focusing on her husband, she thought her disease to her husband came back to the home, was with a group of friends that she does not like him to be with, never changes his T-shirt, this bothers her a lot, sometimes she is very angry, has called her neighbors, 911 few times,  During the conversation, she was able to tell me the date her husband passed away in 27-Oct-2014, but she still think her husband coming back to the house every day,   She also has gradual onset memory loss, making mistakes in her check balance, needing help from her son,  Her mother suffered dementia, maternal grandmother also suffered dementia Personally reviewed CT head without contrast November 2019: No acute abnormality, moderate atrophy, mild small vessel disease. Laboratory evaluation in May 2020: Vitamin B12 138, she is on vitamin B12 supplements now Normal ESR, TSH,  A1c, CMP showed elevated glucose 129, CBC showed hemoglobin of 11.4  UPDATE Sept 24 2020: She came in with her son, Seroquel 25 mg every night has helped her hallucinations, but she has significant side effect with 2 tablets every night, excessive sleepiness next day,    We personally reviewed MRI of the brain in July 2020, mild generalized atrophy, supratentorium small vessel disease no acute abnormality  Update Jan 18, 2020 SS: When last seen found to have mild UTI, was treated with nitrofurantoin.  Here today with her son, has been doing a lot better.  She has caregivers who come daily 9 am-midnight.  They more provide supervision for safety.  She is able to perform most of her ADLs.  Her son manages her medications, sitters remind her to take them.  She has difficulty with time, remembering the day, has to wait until the newspaper comes.  Had a fall 4 months ago, fell out of the kitchen chair.  Has been using a walker.  Is taking Seroquel 25 mg at night, higher doses were too sedating, sleeps well.  Hallucinations are much improved, now that the days are longer, more daylight.  Hallucinations are still present, but not nearly as frequent, mostly about her deceased husband.  She takes Xanax during the day as needed, possibly once every 2-3 weeks.   REVIEW OF SYSTEMS: Out of a complete 14 system review of symptoms, the patient complains only of the following symptoms, and all other reviewed systems are negative.  Memory loss, hallucinations  ALLERGIES: Allergies  Allergen Reactions  . Penicillins  REACTION: Rash  . Sulfamethoxazole-Trimethoprim     rash  . Amoxicillin Rash    HOME MEDICATIONS: Outpatient Medications Prior to Visit  Medication Sig Dispense Refill  . ALPRAZolam (XANAX) 0.5 MG tablet Take 1 tablet (0.5 mg total) by mouth at bedtime as needed for anxiety. 30 tablet 5  . atorvastatin (LIPITOR) 20 MG tablet Take 1 tablet (20 mg total) by mouth daily. 90 tablet 3  .  cyanocobalamin (,VITAMIN B-12,) 1000 MCG/ML injection Inject 1,000 mcg into the muscle every 8 (eight) weeks.     . furosemide (LASIX) 20 MG tablet Take one pill by mouth twice weekly 24 tablet 3  . losartan (COZAAR) 25 MG tablet Take 1 tablet (25 mg total) by mouth daily. 90 tablet 3  . metFORMIN (GLUCOPHAGE) 500 MG tablet Take 1 tablet (500 mg total) by mouth 2 (two) times daily with a meal. 180 tablet 3  . potassium chloride (KLOR-CON) 10 MEQ tablet Take one pill by mouth once weekly when you take your furosemide 24 tablet 3  . QUEtiapine (SEROQUEL) 25 MG tablet Take 2 tablets (50 mg total) by mouth at bedtime. (Patient taking differently: Take 1-1.5 mg by mouth at bedtime. ) 60 tablet 11   No facility-administered medications prior to visit.    PAST MEDICAL HISTORY: Past Medical History:  Diagnosis Date  . Anxiety   . Congenital foot deformity   . Dermatophytosis   . Hallucinations   . HLD (hyperlipidemia)   . HTN (hypertension)   . Hyperglycemia   . Irregular heart rhythm    pt unsure of what type; followed by PCP only  . Ischemic colitis ([place]) 7/13   on colonoscopy  . Memory loss   . Obesity   . Rotator cuff syndrome     PAST SURGICAL HISTORY: Past Surgical History:  Procedure Laterality Date  . CATARACT EXTRACTION W/PHACO Right 09/09/2016   Procedure: CATARACT EXTRACTION PHACO AND INTRAOCULAR LENS PLACEMENT (IOC);  Surgeon: Leandrew Koyanagi, MD;  Location: [facility];  Service: Ophthalmology;  Laterality: Right;  right  . CATARACT EXTRACTION W/PHACO Left 12/16/2016   Procedure: CATARACT EXTRACTION PHACO AND INTRAOCULAR LENS PLACEMENT ([place])  Left;  Surgeon: Leandrew Koyanagi, MD;  Location: [facility];  Service: Ophthalmology;  Laterality: Left;  . HUMERUS FRACTURE SURGERY  10/1997  . TOTAL ABDOMINAL HYSTERECTOMY  1980's   due to menometrorrhagia    FAMILY HISTORY: Family History  Problem Relation Age of Onset  . Stroke Mother   . Parkinsonism  Mother   . Hypertension Mother   . Alzheimer's disease Mother   . Cancer Father        bladder/prostate  . Breast cancer Neg Hx     SOCIAL HISTORY: Social History   Socioeconomic History  . Marital status: Widowed    Spouse name: Not on file  . Number of children: 2  . Years of education: 3  . Highest education level: High school graduate  Occupational History  . Occupation: retired Network engineer  Tobacco Use  . Smoking status: Never Smoker  . Smokeless tobacco: Never Used  Substance and Sexual Activity  . Alcohol use: No  . Drug use: No  . Sexual activity: Never  Other Topics Concern  . Not on file  Social History Narrative   Lives at home alone.   Right-handed.   One cup coffee per day.   Social Determinants of Health   Financial Resource Strain: Low Risk   . Difficulty of Paying Living Expenses: Not hard  at all  Food Insecurity: No Food Insecurity  . Worried About Charity fundraiser in the Last Year: Never true  . Ran Out of Food in the Last Year: Never true  Transportation Needs: No Transportation Needs  . Lack of Transportation (Medical): No  . Lack of Transportation (Non-Medical): No  Physical Activity: Inactive  . Days of Exercise per Week: 0 days  . Minutes of Exercise per Session: 0 min  Stress: No Stress Concern Present  . Feeling of Stress : Not at all  Social Connections:   . Frequency of Communication with Friends and Family:   . Frequency of Social Gatherings with Friends and Family:   . Attends Religious Services:   . Active Member of Clubs or Organizations:   . Attends Archivist Meetings:   Marland Kitchen Marital Status:   Intimate Partner Violence: Not At Risk  . Fear of Current or Ex-Partner: No  . Emotionally Abused: No  . Physically Abused: No  . Sexually Abused: No      PHYSICAL EXAM  Vitals:   01/18/20 1238  BP: (!) 168/73  Pulse: 60  Weight: 243 lb (110.2 kg)  Height: 5' 5.5" (1.664 m)   Body mass index is 39.82  kg/m.  Generalized: Well developed, in no acute distress  MMSE - Mini Mental State Exam 01/18/2020 05/29/2019 03/08/2019  Orientation to time _0 Orientation to Place _1 Registration _2 Attention/ Calculation _3 Recall _4 Recall-comments - - -  Language- name 2 objects 2 - 2  Language- repeat 0 1 1  Language- follow 3 step command 3 - 3  Language- read & follow direction 1 - 1  Write a sentence 1 - 1  Copy design 0 - 1  Total score 24 - 28    Neurological examination  Mentation: Alert oriented to time, place, history provided by patient but is corrected by son. Follows all commands speech and language fluent Cranial nerve II-XII: Pupils were equal round reactive to light. Extraocular movements were full, visual field were full on confrontational test. Facial sensation and strength were normal. Head turning and shoulder shrug  were normal and symmetric. Motor: 4/5 strength throughout Sensory: Sensory testing is intact to soft touch on all 4 extremities. No evidence of extinction is noted.  Coordination: Cerebellar testing reveals good finger-nose-finger and heel-to-shin bilaterally.  Gait and station: Has to push off from seated position to stand, gait is wide-based, uses walker Reflexes: Deep tendon reflexes are symmetric and normal bilaterally.   DIAGNOSTIC DATA (LABS, IMAGING, TESTING) - I reviewed patient records, labs, notes, testing and imaging myself where available.  Lab Results  Component Value Date   WBC 9.2 06/18/2019   HGB 11.4 (L) 06/18/2019   HCT 36.1 06/18/2019   MCV 81.5 06/18/2019   PLT 319 06/18/2019      Component Value Date/Time   NA 141 09/14/2019 1101   NA 138 01/20/2012 1104   K 3.5 09/14/2019 1101   K 3.8 01/20/2012 1104   CL 103 09/14/2019 1101   CL 103 01/20/2012 1104   CO2 31 09/14/2019 1101   CO2 29 01/20/2012 1104   GLUCOSE 146 (H) 09/14/2019 1101   GLUCOSE 116 (H) 01/20/2012 1104   BUN 15 09/14/2019 1101   BUN 12  01/22/2012 1133   CREATININE 0.76 09/14/2019 1101   CREATININE 0.77 01/22/2012 1133   CALCIUM 9.1 09/14/2019 1101   CALCIUM  8.8 01/20/2012 1104   PROT 6.7 06/18/2019 1345   PROT 7.1 01/20/2012 1104   ALBUMIN 3.8 06/18/2019 1345   ALBUMIN 3.5 01/20/2012 1104   AST 40 06/18/2019 1345   AST 30 01/20/2012 1104   ALT 24 06/18/2019 1345   ALT 27 01/20/2012 1104   ALKPHOS 89 06/18/2019 1345   ALKPHOS 84 01/20/2012 1104   BILITOT 1.0 06/18/2019 1345   BILITOT 1.1 (H) 01/20/2012 1104   GFRNONAA >60 06/19/2019 0520   GFRNONAA >60 01/22/2012 1133   GFRAA >60 06/19/2019 0520   GFRAA >60 01/22/2012 1133   Lab Results  Component Value Date   CHOL 195 06/05/2019   HDL 41.80 06/05/2019   LDLCALC 130 (H) 06/05/2019   TRIG 118.0 06/05/2019   CHOLHDL 5 06/05/2019   Lab Results  Component Value Date   HGBA1C 6.7 (H) 06/05/2019   Lab Results  Component Value Date   VITAMINB12 >1500 (H) 06/05/2019   Lab Results  Component Value Date   TSH 4.25 06/05/2019    ASSESSMENT AND PLAN 86 y.o. year old female  has a past medical history of Anxiety, Congenital foot deformity, Dermatophytosis, Hallucinations, HLD (hyperlipidemia), HTN (hypertension), Hyperglycemia, Irregular heart rhythm, Ischemic colitis ([place]) (7/13), Memory loss, Obesity, and Rotator cuff syndrome. here with:  1.  Visual hallucinations 2.  Dementia -MMSE 24/30, overall, things are much improved, less frequent hallucinations -Consistent with central nervous system degenerative disorder -No significant parkinsonian features -MRI of the brain showed mild generalized atrophy, supratentorium small vessel disease -Continue Seroquel 25 mg at bedtime (higher doses were sedating) -Take Xanax 0.5 mg as needed for anxiety (no refills needed per son, takes once every 2-3 weeks) -We talked about Namenda, we discussed to hold off for now since things are doing much better, likely would not offer Aricept due to history of  hallucinations -Follow-up in 6 months or sooner if needed, if patient remains stable, will send back to PCP  2.  B12 deficiency -Continue B12 supplement  I spent 30 minutes of face-to-face and non-face-to-face time with patient.  This included previsit chart review, lab review, study review, order entry, electronic health record documentation, patient education.  Butler Denmark, AGNP-C, DNP 01/18/2020, 12:44 PM [facility] [address], [place],  [address] [phone]

## 2020-01-18 NOTE — Patient Instructions (Signed)
Continue current medications  Stay on Seroquel 25 mg at bedtime See you back in 6 months

## 2020-02-27 ENCOUNTER — Other Ambulatory Visit: Payer: Self-pay

## 2020-02-27 ENCOUNTER — Ambulatory Visit: Payer: PPO | Admitting: Family Medicine

## 2020-02-27 ENCOUNTER — Emergency Department: Payer: PPO

## 2020-02-27 ENCOUNTER — Emergency Department
Admission: EM | Admit: 2020-02-27 | Discharge: 2020-02-28 | Disposition: A | Discharge status: Home / Self Care | Payer: PPO | Attending: Student | Admitting: Student

## 2020-02-27 DIAGNOSIS — E1165 Type 2 diabetes mellitus with hyperglycemia: Secondary | ICD-10-CM | POA: Diagnosis not present

## 2020-02-27 DIAGNOSIS — R062 Wheezing: Secondary | ICD-10-CM | POA: Diagnosis not present

## 2020-02-27 DIAGNOSIS — Z79899 Other long term (current) drug therapy: Secondary | ICD-10-CM | POA: Insufficient documentation

## 2020-02-27 DIAGNOSIS — I10 Essential (primary) hypertension: Secondary | ICD-10-CM | POA: Insufficient documentation

## 2020-02-27 DIAGNOSIS — R531 Weakness: Secondary | ICD-10-CM | POA: Diagnosis not present

## 2020-02-27 DIAGNOSIS — Z880 Allergy status to penicillin: Secondary | ICD-10-CM | POA: Diagnosis not present

## 2020-02-27 DIAGNOSIS — R600 Localized edema: Secondary | ICD-10-CM | POA: Insufficient documentation

## 2020-02-27 DIAGNOSIS — M6281 Muscle weakness (generalized): Secondary | ICD-10-CM | POA: Insufficient documentation

## 2020-02-27 DIAGNOSIS — Z7984 Long term (current) use of oral hypoglycemic drugs: Secondary | ICD-10-CM | POA: Diagnosis not present

## 2020-02-27 DIAGNOSIS — I517 Cardiomegaly: Secondary | ICD-10-CM | POA: Diagnosis not present

## 2020-02-27 LAB — CBC
HCT: 34.4 % — ABNORMAL LOW (ref 36.0–46.0)
Hemoglobin: 11.2 g/dL — ABNORMAL LOW (ref 12.0–15.0)
MCH: 27.8 pg (ref 26.0–34.0)
MCHC: 32.6 g/dL (ref 30.0–36.0)
MCV: 85.4 fL (ref 80.0–100.0)
Platelets: 284 10*3/uL (ref 150–400)
RBC: 4.03 MIL/uL (ref 3.87–5.11)
RDW: 15.2 % (ref 11.5–15.5)
WBC: 9.7 10*3/uL (ref 4.0–10.5)
nRBC: 0 % (ref 0.0–0.2)

## 2020-02-27 LAB — COMPREHENSIVE METABOLIC PANEL
ALT: 15 U/L (ref 0–44)
AST: 19 U/L (ref 15–41)
Albumin: 3.6 g/dL (ref 3.5–5.0)
Alkaline Phosphatase: 86 U/L (ref 38–126)
Anion gap: 10 (ref 5–15)
BUN: 17 mg/dL (ref 8–23)
CO2: 28 mmol/L (ref 22–32)
Calcium: 8.6 mg/dL — ABNORMAL LOW (ref 8.9–10.3)
Chloride: 101 mmol/L (ref 98–111)
Creatinine, Ser: 0.89 mg/dL (ref 0.44–1.00)
GFR calc Af Amer: >60 mL/min (ref >60)
GFR calc non Af Amer: 60 mL/min — ABNORMAL LOW (ref >60)
Glucose, Bld: 104 mg/dL — ABNORMAL HIGH (ref 70–99)
Potassium: 3.4 mmol/L — ABNORMAL LOW (ref 3.5–5.1)
Sodium: 139 mmol/L (ref 135–145)
Total Bilirubin: 0.9 mg/dL (ref 0.3–1.2)
Total Protein: 6.8 g/dL (ref 6.5–8.1)

## 2020-02-27 LAB — URINALYSIS, COMPLETE (UACMP) WITH MICROSCOPIC
Bilirubin Urine: NEGATIVE
Glucose, UA: NEGATIVE mg/dL
Hgb urine dipstick: NEGATIVE
Ketones, ur: NEGATIVE mg/dL
Leukocytes,Ua: NEGATIVE
Nitrite: NEGATIVE
Protein, ur: NEGATIVE mg/dL
Specific Gravity, Urine: 1.011 (ref 1.005–1.030)
pH: 6 (ref 5.0–8.0)

## 2020-02-27 LAB — TROPONIN I (HIGH SENSITIVITY): Troponin I (High Sensitivity): 4 ng/L (ref <18)

## 2020-02-27 LAB — GLUCOSE, CAPILLARY: Glucose-Capillary: 93 mg/dL (ref 70–99)

## 2020-02-27 MED ORDER — POTASSIUM CHLORIDE CRYS ER 20 MEQ PO TBCR
40.0000 meq | EXTENDED_RELEASE_TABLET | Freq: Once | ORAL | Status: AC
  Administered 2020-02-28: 40 meq via ORAL
  Filled 2020-02-27: qty 2

## 2020-02-27 MED ORDER — SODIUM CHLORIDE 0.9% FLUSH: 3.0000 mL | Freq: Once | INTRAVENOUS | Status: DC

## 2020-02-27 MED ORDER — FUROSEMIDE 40 MG PO TABS
40.0000 mg | ORAL_TABLET | Freq: Once | ORAL | Status: AC
  Administered 2020-02-28: 40 mg via ORAL
  Filled 2020-02-27: qty 1

## 2020-02-27 NOTE — ED Triage Notes (Signed)
Pt to the er for weakness. Pt son reports bad days lately. Pt is very confused, speaking with the dead, raspy breathing, frequent urination and back pain.

## 2020-02-27 NOTE — ED Provider Notes (Signed)
[facility] Emergency Department Provider Note  ____________________________________________   First MD Initiated Contact with Patient 02/27/20 2305     (approximate)  I have reviewed the triage vital signs and the nursing notes.  History  Chief Complaint Weakness    HPI Hannah Duran is a 86 y.o. female PMHx as below, who presents to the ER for generalized weakness.  Patient and son at bedside states that the patient has become generally more weak over the last 7 days. Constant since onset. She has had difficulty with her overall mobility and moving around.  She also reports some wheezing and a cough, which is new over the last week as well.  This is largely nonproductive.  No fevers or sick contacts.  Has been fully vaccinated against COVID-19.  No history of asthma, COPD, or tobacco use.  No vomiting or diarrhea.  No dysuria or malodorous urine.  No lateralizing weakness.  No numbness or tingling.  She does report increased fluid in the bilateral lower extremities, symmetric.  She takes Lasix twice a week as prescribed.   Past Medical Hx Past Medical History:  Diagnosis Date   Anxiety    Congenital foot deformity    Dermatophytosis    Hallucinations    HLD (hyperlipidemia)    HTN (hypertension)    Hyperglycemia    Irregular heart rhythm    pt unsure of what type; followed by PCP only   Ischemic colitis ([place]) 7/13   on colonoscopy   Memory loss    Obesity    Rotator cuff syndrome     Problem List Patient Active Problem List   Diagnosis Date Noted   Dementia ([place]) 01/18/2020   Dark urine 12/11/2019   Vaginal discharge 12/11/2019   Urine frequency 12/11/2019   Poor balance 06/29/2019   Syncope 06/18/2019   Vitamin B12 deficiency 03/29/2019   Change in mental status 01/05/2019   Visual hallucinations 01/05/2019   History of recurrent UTIs 01/05/2019   Screening mammogram, encounter for 05/31/2018   Encounter for  screening mammogram for breast cancer 05/28/2017   Routine general medical examination at a health care facility 05/19/2016   Ankle edema 05/01/2015   Venous (peripheral) insufficiency 05/01/2015   Encounter for Medicare annual wellness exam 06/02/2013   Internal hemorrhoid 06/21/2012   Morbid obesity ([place])    HTN (hypertension)    Hyperlipidemia associated with type 2 diabetes mellitus ([place])    Diabetes type 2, controlled ([place])     Past Surgical Hx Past Surgical History:  Procedure Laterality Date   CATARACT EXTRACTION W/PHACO Right 09/09/2016   Procedure: CATARACT EXTRACTION PHACO AND INTRAOCULAR LENS PLACEMENT ([place]);  Surgeon: Leandrew Koyanagi, MD;  Location: [facility];  Service: Ophthalmology;  Laterality: Right;  right   CATARACT EXTRACTION W/PHACO Left 12/16/2016   Procedure: CATARACT EXTRACTION PHACO AND INTRAOCULAR LENS PLACEMENT ([place])  Left;  Surgeon: Leandrew Koyanagi, MD;  Location: [facility];  Service: Ophthalmology;  Laterality: Left;   HUMERUS FRACTURE SURGERY  10/1997   TOTAL ABDOMINAL HYSTERECTOMY  1980's   due to menometrorrhagia    Medications Prior to Admission medications   Medication Sig Start Date End Date Taking? Authorizing Provider  ALPRAZolam Duanne Moron) 0.5 MG tablet Take 1 tablet (0.5 mg total) by mouth at bedtime as needed for anxiety. 05/25/19   Marcial Pacas, MD  atorvastatin (LIPITOR) 20 MG tablet Take 1 tablet (20 mg total) by mouth daily. 06/07/19   Tower, Wynelle Fanny, MD  cyanocobalamin (,VITAMIN B-12,)  1000 MCG/ML injection Inject 1,000 mcg into the muscle every 8 (eight) weeks.     Tower, Wynelle Fanny, MD  furosemide (LASIX) 20 MG tablet Take one pill by mouth twice weekly 08/08/19   Tower, Wynelle Fanny, MD  losartan (COZAAR) 25 MG tablet Take 1 tablet (25 mg total) by mouth daily. 07/24/19   Tower, Wynelle Fanny, MD  metFORMIN (GLUCOPHAGE) 500 MG tablet Take 1 tablet (500 mg total) by mouth 2 (two) times daily with a meal. 06/05/19   Tower,  Wynelle Fanny, MD  potassium chloride (KLOR-CON) 10 MEQ tablet Take one pill by mouth once weekly when you take your furosemide 08/08/19   Tower, Wynelle Fanny, MD  QUEtiapine (SEROQUEL) 25 MG tablet Take 1 tablet (25 mg total) by mouth at bedtime. 01/18/20   Suzzanne Cloud, NP    Allergies Penicillins, Sulfamethoxazole-trimethoprim, and Amoxicillin  Family Hx Family History  Problem Relation Age of Onset   Stroke Mother    Parkinsonism Mother    Hypertension Mother    Alzheimer's disease Mother    Cancer Father        bladder/prostate   Breast cancer Neg Hx     Social Hx Social History   Tobacco Use   Smoking status: Never Smoker   Smokeless tobacco: Never Used  Vaping Use   Vaping Use: Never used  Substance Use Topics   Alcohol use: No   Drug use: No     Review of Systems  Constitutional: Negative for fever. Negative for chills. + generalized weakness Eyes: Negative for visual changes. ENT: Negative for sore throat. Cardiovascular: Negative for chest pain. Respiratory: Negative for shortness of breath. + cough Gastrointestinal: Negative for nausea. Negative for vomiting.  Genitourinary: Negative for dysuria. Musculoskeletal: + BLE edema Skin: Negative for rash. Neurological: Negative for headaches.   Physical Exam  Vital Signs: ED Triage Vitals  Enc Vitals Group     BP 02/27/20 2002 (!) 155/95     Pulse Rate 02/27/20 2002 87     Resp 02/27/20 2002 18     Temp 02/27/20 2002 99 F (37.2 C)     Temp Source 02/27/20 2002 Oral     SpO2 02/27/20 2002 100 %     Weight 02/27/20 1954 242 lb 8.1 oz (110 kg)     Height 02/27/20 1954 5\' 5"  (1.651 m)     Head Circumference --      Peak Flow --      Pain Score 02/27/20 2003 0     Pain Loc --      Pain Edu? --      Excl. in [place]? --     Constitutional: Alert and oriented. Well appearing. NAD.  Head: Normocephalic. Atraumatic. Eyes: Conjunctivae clear. Sclera anicteric. Pupils equal and symmetric. Nose: No masses  or lesions. No congestion or rhinorrhea. Mouth/Throat: Wearing mask.  Neck: No stridor. Trachea midline.  Cardiovascular: Normal rate, regular rhythm. Extremities well perfused. Respiratory: Normal respiratory effort.  Normal work of breathing.  Oxygen 100% on RA.  Fine scattered expiratory wheezing bilaterally. Gastrointestinal: Soft. Non-distended. Non-tender.  Genitourinary: Deferred. Musculoskeletal: 2-3+ symmetric BLE edema.  No erythema or tenderness. No asymmetry.  Neurologic:  Normal speech and language. No gross focal or lateralizing neurologic deficits are appreciated.  Skin: Skin is warm, dry and intact. No rash noted. Psychiatric: Mood and affect are appropriate for situation.  EKG  Personally reviewed and interpreted by myself.   Date: 02/27/2020 Time: 2011 Rate: 82 Rhythm: Sinus Axis: Left  Intervals: Within normal limits PVCs No acute ischemic changes No STEMI    Radiology  Personally reviewed available imaging myself.   CXR - IMPRESSION:  No active cardiopulmonary disease. Mild cardiomegaly.    Procedures  Procedure(s) performed (including critical care):  Procedures   Initial Impression / Assessment and Plan / MDM / ED Course  86 y.o. female who presents to the ED for generalized weakness, as above  Presentation seems most consistent w/ mild volume overload. Suspect wheezing 2/2 to this.  Lower suspicion for infectious etiology given no fever, no white count, but consider possible mild bronchitis contributing as well. Additional Ddx: anemia, HF exacerbation, atypical ACS, electrolyte abnormality, UTI.  Neurological exam is nonfocal, no lateralizing findings on exam, doubt intracranial etiology.  Will plan for labs, imaging, UA, EKG  Labs reveal hemoglobin 11.2, consistent with her priors.  Remainder of labs without actionable derangements.  UA negative for infection.  EKG with PVCs but otherwise no acute arrhythmia or ischemic changes.  Troponin and  delta troponin negative.  BNP within normal limits.  Mild cardiomegaly on XR, but no active disease.  Given negative work-up, feel patient is stable for discharge with outpatient follow-up.  Will have her take her Lasix and potassium supplement daily for the next 5 days, as well as Rx a Z-Pak for possible bronchitis.  Advised outpatient follow-up with her PCP for recheck next week.  Patient and son at bedside voiced understanding and are comfortable with the plan and discharge.  Given return precautions.   _______________________________   As part of my medical decision making I have reviewed available labs, radiology tests, reviewed old records/performed chart review, obtained additional history from son at bedside.    Final Clinical Impression(s) / ED Diagnosis  Generalized weakness BLE edema Wheezing    Note:  This document was prepared using Dragon voice recognition software and may include unintentional dictation errors.   Lilia Pro., MD 02/28/20 [phone]

## 2020-02-28 LAB — TROPONIN I (HIGH SENSITIVITY): Troponin I (High Sensitivity): 3 ng/L (ref <18)

## 2020-02-28 LAB — BRAIN NATRIURETIC PEPTIDE: B Natriuretic Peptide: 94.1 pg/mL (ref 0.0–100.0)

## 2020-02-28 MED ORDER — AZITHROMYCIN 250 MG PO TABS: 250.0000 mg | ORAL_TABLET | Freq: Every day | ORAL | 0 refills | Status: AC

## 2020-02-28 NOTE — Discharge Instructions (Signed)
Thank you for letting us take care of you in the emergency department today.   For the next 5 days, take your Lasix and potassium supplement daily.  We will also send you home with a Z-Pak to help for a possible bronchitis.  Please follow up with: Your primary care doctor to review your ER visit and follow up on your symptoms.   Please return to the ER for any new or worsening symptoms.

## 2020-03-02 LAB — URINE CULTURE: Culture: >=100000 — AB

## 2020-03-04 ENCOUNTER — Other Ambulatory Visit: Payer: Self-pay | Admitting: Family Medicine

## 2020-03-08 ENCOUNTER — Telehealth: Payer: Self-pay | Admitting: Family Medicine

## 2020-03-08 MED ORDER — FUROSEMIDE 20 MG PO TABS: 20.0000 mg | ORAL_TABLET | Freq: Every day | ORAL | 3 refills | Status: DC

## 2020-03-08 MED ORDER — POTASSIUM CHLORIDE ER 10 MEQ PO TBCR: 10.0000 meq | EXTENDED_RELEASE_TABLET | Freq: Every day | ORAL | 3 refills | Status: DC

## 2020-03-08 NOTE — Telephone Encounter (Signed)
Son notified Rxs sent to pharmacy

## 2020-03-08 NOTE — Telephone Encounter (Signed)
I called patient's son, Chriss Czar, to reschedule patient's hospital follow up scheduled on 03/14/20. He rescheduled appointment to 03/21/20 because he's only able to come with patient on Thursdays.  He said that was fine, but a refill request for patient's Lasix and Potassium were denied. When patient was in [facility] for fluid on her lungs the doctor increased patient's Lasix and Potassium.  Ron is requesting a refill.  Patient has 5 Lasix pills left and she's out of Potassium.  Ron is giving her over the counter Potassium until she can get a refill.  Patient uses [facility]

## 2020-03-08 NOTE — Telephone Encounter (Signed)
I changed directions to daily dosing so she would have enough

## 2020-03-14 ENCOUNTER — Inpatient Hospital Stay: Payer: PPO | Admitting: Family Medicine

## 2020-03-21 ENCOUNTER — Other Ambulatory Visit: Payer: Self-pay

## 2020-03-21 ENCOUNTER — Encounter: Payer: Self-pay | Admitting: Family Medicine

## 2020-03-21 ENCOUNTER — Inpatient Hospital Stay: Payer: PPO | Admitting: Family Medicine

## 2020-03-21 ENCOUNTER — Ambulatory Visit (INDEPENDENT_AMBULATORY_CARE_PROVIDER_SITE_OTHER): Payer: PPO | Admitting: Family Medicine

## 2020-03-21 VITALS — BP 130/66 | HR 67 | Temp 96.8°F | Ht 65.0 in | Wt 240.3 lb

## 2020-03-21 DIAGNOSIS — E1169 Type 2 diabetes mellitus with other specified complication: Secondary | ICD-10-CM

## 2020-03-21 DIAGNOSIS — R29898 Other symptoms and signs involving the musculoskeletal system: Secondary | ICD-10-CM | POA: Diagnosis not present

## 2020-03-21 DIAGNOSIS — M25473 Effusion, unspecified ankle: Secondary | ICD-10-CM

## 2020-03-21 DIAGNOSIS — R2689 Other abnormalities of gait and mobility: Secondary | ICD-10-CM | POA: Diagnosis not present

## 2020-03-21 DIAGNOSIS — I10 Essential (primary) hypertension: Secondary | ICD-10-CM | POA: Diagnosis not present

## 2020-03-21 DIAGNOSIS — E876 Hypokalemia: Secondary | ICD-10-CM

## 2020-03-21 DIAGNOSIS — R531 Weakness: Secondary | ICD-10-CM | POA: Insufficient documentation

## 2020-03-21 DIAGNOSIS — R062 Wheezing: Secondary | ICD-10-CM

## 2020-03-21 DIAGNOSIS — E785 Hyperlipidemia, unspecified: Secondary | ICD-10-CM

## 2020-03-21 LAB — BASIC METABOLIC PANEL
BUN: 18 mg/dL (ref 6–23)
CO2: 30 mEq/L (ref 19–32)
Calcium: 9.3 mg/dL (ref 8.4–10.5)
Chloride: 103 mEq/L (ref 96–112)
Creatinine, Ser: 0.78 mg/dL (ref 0.40–1.20)
GFR: 70.37 mL/min (ref >60.00)
Glucose, Bld: 140 mg/dL — ABNORMAL HIGH (ref 70–99)
Potassium: 3.9 mEq/L (ref 3.5–5.1)
Sodium: 140 mEq/L (ref 135–145)

## 2020-03-21 MED ORDER — POTASSIUM CHLORIDE ER 10 MEQ PO TBCR: 10.0000 meq | EXTENDED_RELEASE_TABLET | Freq: Every day | ORAL | 5 refills | Status: DC

## 2020-03-21 MED ORDER — FUROSEMIDE 20 MG PO TABS: 20.0000 mg | ORAL_TABLET | Freq: Every day | ORAL | 5 refills | Status: DC

## 2020-03-21 NOTE — Assessment & Plan Note (Signed)
Pt requires assistance with ambularion and is homebound  Recent bilateral leg weakness and worsening balance- prone to falls with one last night  Ref to home health for PT

## 2020-03-21 NOTE — Assessment & Plan Note (Signed)
Rev recent ER visit for weakness/fatigue and wheezing Reviewed hospital records, lab results and studies in detail   Thought to be due to fluid overload plus bronchitis Nl lung exam today  Did improve quickly with zpack and increased lasix

## 2020-03-21 NOTE — Assessment & Plan Note (Signed)
More falls lately  req assist for ADLs

## 2020-03-21 NOTE — Assessment & Plan Note (Signed)
Taking lasix Ran out of px and taking otc  Lab for bmp today

## 2020-03-21 NOTE — Assessment & Plan Note (Signed)
Generalized weakness worse lately  Leads to falls Home health ref for PT done

## 2020-03-21 NOTE — Patient Instructions (Signed)
I'm glad you are feeling better   I re sent the lasix and potassium px to your pharmacy If any problems please let me know   Labs today   I will work on a home health PT referral as well and the office will call you to set that up

## 2020-03-21 NOTE — Assessment & Plan Note (Signed)
Continues to tolerate atorvastatin

## 2020-03-21 NOTE — Assessment & Plan Note (Addendum)
Wt loss may ultimately help mobility  This is a challenge in light of weakness and cognitive issues

## 2020-03-21 NOTE — Assessment & Plan Note (Signed)
Lasix daily has helped more than her previous intermittent dosage  Enc her to elevate feet Rev the recent ER note with ? Of fluid overload  Refilled lasix and K (has been taking otc K) Labs today for bmp

## 2020-03-21 NOTE — Progress Notes (Signed)
Subjective:    Patient ID: Hannah Duran, female    DOB: 20-Jul-1934, 86 y.o.   MRN: [id number]  This visit occurred during the SARS-CoV-2 public health emergency.  Safety protocols were in place, including screening questions prior to the visit, additional usage of staff PPE, and extensive cleaning of exam room while observing appropriate contact time as indicated for disinfecting solutions.    HPI Pt presents for f/u of ER visit for weakness and wheezing /pedal edema on 6/29  Determined to have mild volume overload and possible bronchitis   ua neg Cbc stable  Neg cardiac enzymes and bnp (mild cardiomegaly on cxr)  She was treated with lasix and K daily for 5 d as well as zpack  Adv outpt f/u   Admission on 02/27/2020, Discharged on 02/28/2020  Component Date Value Ref Range Status  . Sodium 02/27/2020 139  135 - 145 mmol/L Final  . Potassium 02/27/2020 3.4* 3.5 - 5.1 mmol/L Final  . Chloride 02/27/2020 101  98 - 111 mmol/L Final  . CO2 02/27/2020 28  22 - 32 mmol/L Final  . Glucose, Bld 02/27/2020 104* 70 - 99 mg/dL Final   Glucose reference range applies only to samples taken after fasting for at least 8 hours.  . BUN 02/27/2020 17  8 - 23 mg/dL Final  . Creatinine, Ser 02/27/2020 0.89  0.44 - 1.00 mg/dL Final  . Calcium 02/27/2020 8.6* 8.9 - 10.3 mg/dL Final  . Total Protein 02/27/2020 6.8  6.5 - 8.1 g/dL Final  . Albumin 02/27/2020 3.6  3.5 - 5.0 g/dL Final  . AST 02/27/2020 19  15 - 41 U/L Final  . ALT 02/27/2020 15  0 - 44 U/L Final  . Alkaline Phosphatase 02/27/2020 86  38 - 126 U/L Final  . Total Bilirubin 02/27/2020 0.9  0.3 - 1.2 mg/dL Final  . GFR calc non Af Amer 02/27/2020 60* >60 mL/min Final  . GFR calc Af Amer 02/27/2020 >60  >60 mL/min Final  . Anion gap 02/27/2020 10  5 - 15 Final   Performed at [facility], [address] [place], [place] [address]  . WBC 02/27/2020 9.7  4.0 - 10.5 K/uL Final  . RBC 02/27/2020 4.03  3.87 - 5.11 MIL/uL Final    . Hemoglobin 02/27/2020 11.2* 12.0 - 15.0 g/dL Final  . HCT 02/27/2020 34.4* 36 - 46 % Final  . MCV 02/27/2020 85.4  80.0 - 100.0 fL Final  . MCH 02/27/2020 27.8  26.0 - 34.0 pg Final  . MCHC 02/27/2020 32.6  30.0 - 36.0 g/dL Final  . RDW 02/27/2020 15.2  11.5 - 15.5 % Final  . Platelets 02/27/2020 284  150 - 400 K/uL Final  . nRBC 02/27/2020 0.0  0.0 - 0.2 % Final   Performed at [facility], [address] [place], [place] [address]  . Color, Urine 02/27/2020 YELLOW* YELLOW Final  . APPearance 02/27/2020 HAZY* CLEAR Final  . Specific Gravity, Urine 02/27/2020 1.011  1.005 - 1.030 Final  . pH 02/27/2020 6.0  5.0 - 8.0 Final  . Glucose, UA 02/27/2020 NEGATIVE  NEGATIVE mg/dL Final  . Hgb urine dipstick 02/27/2020 NEGATIVE  NEGATIVE Final  . Bilirubin Urine 02/27/2020 NEGATIVE  NEGATIVE Final  . Ketones, ur 02/27/2020 NEGATIVE  NEGATIVE mg/dL Final  . Protein, ur 02/27/2020 NEGATIVE  NEGATIVE mg/dL Final  . Nitrite 02/27/2020 NEGATIVE  NEGATIVE Final  . Chalmers Guest 02/27/2020 NEGATIVE  NEGATIVE Final  . RBC / HPF 02/27/2020  0-5  0 - 5 RBC/hpf Final  . WBC, UA 02/27/2020 0-5  0 - 5 WBC/hpf Final  . Bacteria, UA 02/27/2020 RARE* NONE SEEN Final  . Squamous Epithelial / LPF 02/27/2020 0-5  0 - 5 Final  . Mucus 02/27/2020 PRESENT   Final   Performed at [facility], [address] [place], [place] [address]  . Glucose-Capillary 02/27/2020 93  70 - 99 mg/dL Final   Glucose reference range applies only to samples taken after fasting for at least 8 hours.  . Troponin I (High Sensitivity) 02/27/2020 4  <18 ng/L Final   Comment: (NOTE) Elevated high sensitivity troponin I (hsTnI) values and significant  changes across serial measurements may suggest ACS but many other  chronic and acute conditions are known to elevate hsTnI results.  Refer to the "Links" section for chest pain algorithms and additional  guidance. Performed at [facility], [address] [place], [place] [address]   . Troponin I (High Sensitivity) 02/27/2020 3  <18 ng/L Final   Comment: (NOTE) Elevated high sensitivity troponin I (hsTnI) values and significant  changes across serial measurements may suggest ACS but many other  chronic and acute conditions are known to elevate hsTnI results.  Refer to the "Links" section for chest pain algorithms and additional  guidance. Performed at [facility], [address] [place], [place] [address]   . B Natriuretic Peptide 02/27/2020 94.1  0.0 - 100.0 pg/mL Final   Performed at [facility], [address] [place], [place] [address]  . Specimen Description 02/27/2020    Final                   Value:URINE, RANDOM Performed at [facility], [address] [place], [place] [address]   . Special Requests 02/27/2020    Final                   Value:NONE Performed at [facility], [address] [place], [place] [address]   . Culture 02/27/2020 *  Final                   Value:>=100,000 COLONIES/mL KOCURIA ROSEA Standardized susceptibility testing for this organism is not available. Performed at [facility], [place] [address], [place],  [address]   . Report Status 02/27/2020 03/02/2020 FINAL   Final   DG Chest 2 View  Result Date: 02/27/2020 CLINICAL DATA:  Wheezing EXAM: CHEST - 2 VIEW COMPARISON:  06/18/2019 FINDINGS: Minimal atelectasis or scar at the left base. Mild cardiomegaly. No focal consolidation or effusion. No pneumothorax. IMPRESSION: No active cardiopulmonary disease. Mild cardiomegaly. Electronically Signed   By: Donavan Foil M.D.   On: 02/27/2020 20:37   We refilled her lasix and K as it was helping   She has been vaccinated for covid 19    Wt Readings from Last 3 Encounters:  03/21/20 (!) 240 lb 5 oz (109 kg)  02/27/20 242 lb 8.1 oz (110 kg)  01/18/20 243 lb (110.2 kg)   39.99 kg/m  BP Readings from Last 3 Encounters:  03/21/20 (!) 130/66    02/28/20 (!) 144/79  01/18/20 (!) 168/73   Pulse Readings from Last 3 Encounters:  03/21/20 67  02/28/20 72  01/18/20 60   Pulse ox today is 98%   Per son-took 3 days to feel better   Pharmacy would not refill lasix for daily  She has taken it 4 times  per week  Otc K daily   She has had pedal edema on and off   Thinks there is a combination of mental and physical issue- for instance took her 1 1/2 hours to get out of a chair in a restaurant  In general weaker in legs  More trouble navigating stairs  Could use some more walker training   ? If PT would help  [facility] - is home bound    She is prone to falls  Had one last night  No injuries   Patient Active Problem List   Diagnosis Date Noted  . Leg weakness, bilateral 03/21/2020  . Weakness 03/21/2020  . Hypokalemia 03/21/2020  . Dementia ([place]) 01/18/2020  . Dark urine 12/11/2019  . Vaginal discharge 12/11/2019  . Urine frequency 12/11/2019  . Poor balance 06/29/2019  . Syncope 06/18/2019  . Vitamin B12 deficiency 03/29/2019  . Change in mental status 01/05/2019  . Visual hallucinations 01/05/2019  . History of recurrent UTIs 01/05/2019  . Screening mammogram, encounter for 05/31/2018  . Encounter for screening mammogram for breast cancer 05/28/2017  . Routine general medical examination at a health care facility 05/19/2016  . Ankle edema 05/01/2015  . Venous (peripheral) insufficiency 05/01/2015  . Encounter for Medicare annual wellness exam 06/02/2013  . Internal hemorrhoid 06/21/2012  . Morbid obesity ([place])   . HTN (hypertension)   . Hyperlipidemia associated with type 2 diabetes mellitus ([place])   . Diabetes type 2, controlled ([place])    Past Medical History:  Diagnosis Date  . Anxiety   . Congenital foot deformity   . Dermatophytosis   . Hallucinations   . HLD (hyperlipidemia)   . HTN (hypertension)   . Hyperglycemia   . Irregular heart rhythm    pt unsure of what type; followed by PCP only  . Ischemic  colitis ([place]) 7/13   on colonoscopy  . Memory loss   . Obesity   . Rotator cuff syndrome    Past Surgical History:  Procedure Laterality Date  . CATARACT EXTRACTION W/PHACO Right 09/09/2016   Procedure: CATARACT EXTRACTION PHACO AND INTRAOCULAR LENS PLACEMENT (IOC);  Surgeon: Leandrew Koyanagi, MD;  Location: [facility];  Service: Ophthalmology;  Laterality: Right;  right  . CATARACT EXTRACTION W/PHACO Left 12/16/2016   Procedure: CATARACT EXTRACTION PHACO AND INTRAOCULAR LENS PLACEMENT ([place])  Left;  Surgeon: Leandrew Koyanagi, MD;  Location: [facility];  Service: Ophthalmology;  Laterality: Left;  . HUMERUS FRACTURE SURGERY  10/1997  . TOTAL ABDOMINAL HYSTERECTOMY  1980's   due to menometrorrhagia   Social History   Tobacco Use  . Smoking status: Never Smoker  . Smokeless tobacco: Never Used  Vaping Use  . Vaping Use: Never used  Substance Use Topics  . Alcohol use: No  . Drug use: No   Family History  Problem Relation Age of Onset  . Stroke Mother   . Parkinsonism Mother   . Hypertension Mother   . Alzheimer's disease Mother   . Cancer Father        bladder/prostate  . Breast cancer Neg Hx    Allergies  Allergen Reactions  . Penicillins     REACTION: Rash  . Sulfamethoxazole-Trimethoprim     rash  . Amoxicillin Rash   Current Outpatient Medications on File Prior to Visit  Medication Sig Dispense Refill  . atorvastatin (LIPITOR) 20 MG tablet Take 1 tablet by mouth once daily 90 tablet 0  . losartan (COZAAR) 25 MG tablet Take 1 tablet (25  mg total) by mouth daily. 90 tablet 3  . metFORMIN (GLUCOPHAGE) 500 MG tablet Take 1 tablet (500 mg total) by mouth 2 (two) times daily with a meal. 180 tablet 3  . QUEtiapine (SEROQUEL) 25 MG tablet Take 1 tablet (25 mg total) by mouth at bedtime. 30 tablet 11   No current facility-administered medications on file prior to visit.     Review of Systems  Constitutional: Positive for fatigue. Negative for  activity change, appetite change, fever and unexpected weight change.  HENT: Negative for congestion, ear pain, rhinorrhea, sinus pressure and sore throat.   Eyes: Negative for pain, redness and visual disturbance.  Respiratory: Negative for cough, shortness of breath and wheezing.   Cardiovascular: Negative for chest pain and palpitations.  Gastrointestinal: Negative for abdominal pain, blood in stool, constipation and diarrhea.  Endocrine: Negative for polydipsia and polyuria.  Genitourinary: Negative for dysuria, frequency and urgency.  Musculoskeletal: Positive for arthralgias, back pain and gait problem. Negative for myalgias.       Poor mobility  Falls   Skin: Negative for pallor and rash.  Allergic/Immunologic: Negative for environmental allergies.  Neurological: Positive for weakness. Negative for dizziness, syncope, facial asymmetry and headaches.       Poor balance Weakness and falls  Hematological: Negative for adenopathy. Does not bruise/bleed easily.  Psychiatric/Behavioral: Negative for decreased concentration and dysphoric mood. The patient is not nervous/anxious.        Cognitive impairment         Objective:   Physical Exam Constitutional:      General: She is not in acute distress.    Appearance: Normal appearance. She is obese. She is not ill-appearing.     Comments: Frail appearing   Eyes:     General: No scleral icterus.    Conjunctiva/sclera: Conjunctivae normal.     Pupils: Pupils are equal, round, and reactive to light.  Cardiovascular:     Rate and Rhythm: Normal rate and regular rhythm.     Pulses: Normal pulses.     Heart sounds: Normal heart sounds.  Pulmonary:     Effort: Pulmonary effort is normal. No respiratory distress.     Breath sounds: Normal breath sounds. No stridor. No wheezing, rhonchi or rales.     Comments: No crackles  Musculoskeletal:     Cervical back: Neck supple. No rigidity.     Right lower leg: Edema present.     Left lower  leg: Edema present.     Comments: One plus pedal edema bilaterally  Lymphadenopathy:     Cervical: No cervical adenopathy.  Skin:    Coloration: Skin is not pale.     Findings: No erythema or rash.  Neurological:     Mental Status: She is alert.     Motor: Weakness present.     Coordination: Coordination abnormal.     Gait: Gait abnormal.  Psychiatric:        Mood and Affect: Affect is blunt.        Cognition and Memory: Cognition is impaired.     Comments: Blunted affect today  Answers questions appropriately            Assessment & Plan:   Problem List Items Addressed This Visit      Cardiovascular and Mediastinum   HTN (hypertension)    bp in fair control at this time  BP Readings from Last 1 Encounters:  03/21/20 (!) 130/66   No changes needed Most recent labs reviewed  Disc lifstyle change with low sodium diet and exercise        Relevant Medications   furosemide (LASIX) 20 MG tablet     Endocrine   Hyperlipidemia associated with type 2 diabetes mellitus ([place])    Continues to tolerate atorvastatin         Nervous and Auditory   Leg weakness, bilateral - Primary    Pt requires assistance with ambularion and is homebound  Recent bilateral leg weakness and worsening balance- prone to falls with one last night  Ref to home health for PT        Other   Morbid obesity ([place])    Wt loss may ultimately help mobility  This is a challenge in light of weakness and cognitive issues        Ankle edema    Lasix daily has helped more than her previous intermittent dosage  Enc her to elevate feet Rev the recent ER note with ? Of fluid overload  Refilled lasix and K (has been taking otc K) Labs today for bmp      Poor balance    More falls lately  req assist for ADLs      Weakness    Generalized weakness worse lately  Leads to falls Home health ref for PT done      Hypokalemia    Taking lasix Ran out of px and taking otc  Lab for bmp today       Relevant Orders   Basic metabolic panel (Completed)   Wheezing    Rev recent ER visit for weakness/fatigue and wheezing Reviewed hospital records, lab results and studies in detail   Thought to be due to fluid overload plus bronchitis Nl lung exam today  Did improve quickly with zpack and increased lasix

## 2020-03-21 NOTE — Assessment & Plan Note (Signed)
bp in fair control at this time  BP Readings from Last 1 Encounters:  03/21/20 (!) 130/66   No changes needed Most recent labs reviewed  Disc lifstyle change with low sodium diet and exercise

## 2020-03-28 ENCOUNTER — Other Ambulatory Visit: Payer: Self-pay

## 2020-04-01 DIAGNOSIS — F0280 Dementia in other diseases classified elsewhere without behavioral disturbance: Secondary | ICD-10-CM | POA: Diagnosis not present

## 2020-04-01 DIAGNOSIS — I119 Hypertensive heart disease without heart failure: Secondary | ICD-10-CM | POA: Diagnosis not present

## 2020-04-01 DIAGNOSIS — E1169 Type 2 diabetes mellitus with other specified complication: Secondary | ICD-10-CM | POA: Diagnosis not present

## 2020-04-01 DIAGNOSIS — E785 Hyperlipidemia, unspecified: Secondary | ICD-10-CM | POA: Diagnosis not present

## 2020-04-01 DIAGNOSIS — K648 Other hemorrhoids: Secondary | ICD-10-CM | POA: Diagnosis not present

## 2020-04-01 DIAGNOSIS — Q669 Congenital deformity of feet, unspecified, unspecified foot: Secondary | ICD-10-CM | POA: Diagnosis not present

## 2020-04-01 DIAGNOSIS — B359 Dermatophytosis, unspecified: Secondary | ICD-10-CM | POA: Diagnosis not present

## 2020-04-01 DIAGNOSIS — E538 Deficiency of other specified B group vitamins: Secondary | ICD-10-CM | POA: Diagnosis not present

## 2020-04-01 DIAGNOSIS — Z9181 History of falling: Secondary | ICD-10-CM | POA: Diagnosis not present

## 2020-04-01 DIAGNOSIS — E1151 Type 2 diabetes mellitus with diabetic peripheral angiopathy without gangrene: Secondary | ICD-10-CM | POA: Diagnosis not present

## 2020-04-01 DIAGNOSIS — K559 Vascular disorder of intestine, unspecified: Secondary | ICD-10-CM | POA: Diagnosis not present

## 2020-04-01 DIAGNOSIS — Z6838 Body mass index (BMI) 38.0-38.9, adult: Secondary | ICD-10-CM | POA: Diagnosis not present

## 2020-04-01 DIAGNOSIS — Z87440 Personal history of urinary (tract) infections: Secondary | ICD-10-CM | POA: Diagnosis not present

## 2020-04-01 DIAGNOSIS — H9190 Unspecified hearing loss, unspecified ear: Secondary | ICD-10-CM | POA: Diagnosis not present

## 2020-04-01 DIAGNOSIS — M75100 Unspecified rotator cuff tear or rupture of unspecified shoulder, not specified as traumatic: Secondary | ICD-10-CM | POA: Diagnosis not present

## 2020-04-01 DIAGNOSIS — Z7984 Long term (current) use of oral hypoglycemic drugs: Secondary | ICD-10-CM | POA: Diagnosis not present

## 2020-04-01 DIAGNOSIS — I872 Venous insufficiency (chronic) (peripheral): Secondary | ICD-10-CM | POA: Diagnosis not present

## 2020-04-01 DIAGNOSIS — F419 Anxiety disorder, unspecified: Secondary | ICD-10-CM | POA: Diagnosis not present

## 2020-04-09 DIAGNOSIS — F0280 Dementia in other diseases classified elsewhere without behavioral disturbance: Secondary | ICD-10-CM | POA: Diagnosis not present

## 2020-04-09 DIAGNOSIS — Q669 Congenital deformity of feet, unspecified, unspecified foot: Secondary | ICD-10-CM

## 2020-04-09 DIAGNOSIS — B359 Dermatophytosis, unspecified: Secondary | ICD-10-CM

## 2020-04-09 DIAGNOSIS — M75100 Unspecified rotator cuff tear or rupture of unspecified shoulder, not specified as traumatic: Secondary | ICD-10-CM | POA: Diagnosis not present

## 2020-04-09 DIAGNOSIS — I119 Hypertensive heart disease without heart failure: Secondary | ICD-10-CM | POA: Diagnosis not present

## 2020-04-09 DIAGNOSIS — K559 Vascular disorder of intestine, unspecified: Secondary | ICD-10-CM | POA: Diagnosis not present

## 2020-04-09 DIAGNOSIS — K648 Other hemorrhoids: Secondary | ICD-10-CM | POA: Diagnosis not present

## 2020-04-09 DIAGNOSIS — Z87440 Personal history of urinary (tract) infections: Secondary | ICD-10-CM

## 2020-04-09 DIAGNOSIS — Z9181 History of falling: Secondary | ICD-10-CM

## 2020-04-09 DIAGNOSIS — E538 Deficiency of other specified B group vitamins: Secondary | ICD-10-CM | POA: Diagnosis not present

## 2020-04-09 DIAGNOSIS — Z7984 Long term (current) use of oral hypoglycemic drugs: Secondary | ICD-10-CM

## 2020-04-09 DIAGNOSIS — F419 Anxiety disorder, unspecified: Secondary | ICD-10-CM | POA: Diagnosis not present

## 2020-04-09 DIAGNOSIS — I872 Venous insufficiency (chronic) (peripheral): Secondary | ICD-10-CM | POA: Diagnosis not present

## 2020-04-09 DIAGNOSIS — H9190 Unspecified hearing loss, unspecified ear: Secondary | ICD-10-CM | POA: Diagnosis not present

## 2020-04-09 DIAGNOSIS — E785 Hyperlipidemia, unspecified: Secondary | ICD-10-CM | POA: Diagnosis not present

## 2020-04-09 DIAGNOSIS — Z6838 Body mass index (BMI) 38.0-38.9, adult: Secondary | ICD-10-CM

## 2020-04-09 DIAGNOSIS — E1151 Type 2 diabetes mellitus with diabetic peripheral angiopathy without gangrene: Secondary | ICD-10-CM | POA: Diagnosis not present

## 2020-04-09 DIAGNOSIS — E1169 Type 2 diabetes mellitus with other specified complication: Secondary | ICD-10-CM | POA: Diagnosis not present

## 2020-04-19 DIAGNOSIS — K559 Vascular disorder of intestine, unspecified: Secondary | ICD-10-CM | POA: Diagnosis not present

## 2020-04-19 DIAGNOSIS — F419 Anxiety disorder, unspecified: Secondary | ICD-10-CM | POA: Diagnosis not present

## 2020-04-19 DIAGNOSIS — Z6838 Body mass index (BMI) 38.0-38.9, adult: Secondary | ICD-10-CM | POA: Diagnosis not present

## 2020-04-19 DIAGNOSIS — Z9181 History of falling: Secondary | ICD-10-CM | POA: Diagnosis not present

## 2020-04-19 DIAGNOSIS — Z87440 Personal history of urinary (tract) infections: Secondary | ICD-10-CM | POA: Diagnosis not present

## 2020-04-19 DIAGNOSIS — H9190 Unspecified hearing loss, unspecified ear: Secondary | ICD-10-CM | POA: Diagnosis not present

## 2020-04-19 DIAGNOSIS — F0280 Dementia in other diseases classified elsewhere without behavioral disturbance: Secondary | ICD-10-CM | POA: Diagnosis not present

## 2020-04-19 DIAGNOSIS — I119 Hypertensive heart disease without heart failure: Secondary | ICD-10-CM | POA: Diagnosis not present

## 2020-04-19 DIAGNOSIS — Q669 Congenital deformity of feet, unspecified, unspecified foot: Secondary | ICD-10-CM | POA: Diagnosis not present

## 2020-04-19 DIAGNOSIS — E1151 Type 2 diabetes mellitus with diabetic peripheral angiopathy without gangrene: Secondary | ICD-10-CM | POA: Diagnosis not present

## 2020-04-19 DIAGNOSIS — K648 Other hemorrhoids: Secondary | ICD-10-CM | POA: Diagnosis not present

## 2020-04-19 DIAGNOSIS — B359 Dermatophytosis, unspecified: Secondary | ICD-10-CM | POA: Diagnosis not present

## 2020-04-19 DIAGNOSIS — Z7984 Long term (current) use of oral hypoglycemic drugs: Secondary | ICD-10-CM | POA: Diagnosis not present

## 2020-04-19 DIAGNOSIS — M75100 Unspecified rotator cuff tear or rupture of unspecified shoulder, not specified as traumatic: Secondary | ICD-10-CM | POA: Diagnosis not present

## 2020-04-19 DIAGNOSIS — E785 Hyperlipidemia, unspecified: Secondary | ICD-10-CM | POA: Diagnosis not present

## 2020-04-19 DIAGNOSIS — I872 Venous insufficiency (chronic) (peripheral): Secondary | ICD-10-CM | POA: Diagnosis not present

## 2020-04-19 DIAGNOSIS — E1169 Type 2 diabetes mellitus with other specified complication: Secondary | ICD-10-CM | POA: Diagnosis not present

## 2020-04-19 DIAGNOSIS — E538 Deficiency of other specified B group vitamins: Secondary | ICD-10-CM | POA: Diagnosis not present

## 2020-04-29 ENCOUNTER — Telehealth: Payer: Self-pay | Admitting: Family Medicine

## 2020-04-29 MED ORDER — ALBUTEROL SULFATE HFA 108 (90 BASE) MCG/ACT IN AERS
2.0000 | INHALATION_SPRAY | RESPIRATORY_TRACT | 1 refills | Status: DC | PRN
Start: 2020-04-29 — End: 2022-11-03

## 2020-04-29 NOTE — Telephone Encounter (Signed)
Not on med list so called pt to check into this. She said that she was given the inhaler from [facility]. I can see in [place] she was seen for an acute appt at [facility] and was given Proair on 08/13/2018, pt called to try and get a refill of inhaler and they advise her that provider isn't there anymore and she should f/u with PCP regarding inhaler. Pt said old one expired and she wants on to keep PRN  [place]

## 2020-04-29 NOTE — Telephone Encounter (Signed)
Pt notified Rx was sent to pharmacy. 

## 2020-04-29 NOTE — Telephone Encounter (Signed)
Pt needs refill on albuterol [facility] [address]  Pt is out of meds

## 2020-04-29 NOTE — Telephone Encounter (Signed)
I refilled it to have one on hand

## 2020-05-17 IMAGING — CT CT HEAD W/O CM
3 series · 16 of 47 positions shown, 19 images · non-contrast
Comparison: MRI brain dated 03/16/2019. CT head dated 07/25/2018.

CLINICAL DATA: Status post fall

EXAM:
CT HEAD WITHOUT CONTRAST
TECHNIQUE: Contiguous axial images were obtained from the base of the skull
through the vertex without intravenous contrast.

[Series 2: head wo · axial · 0.47mm/px · z∈[+375,+500]mm · 10 of 30 slices shown, 13 images]
[im 3/30  brain]
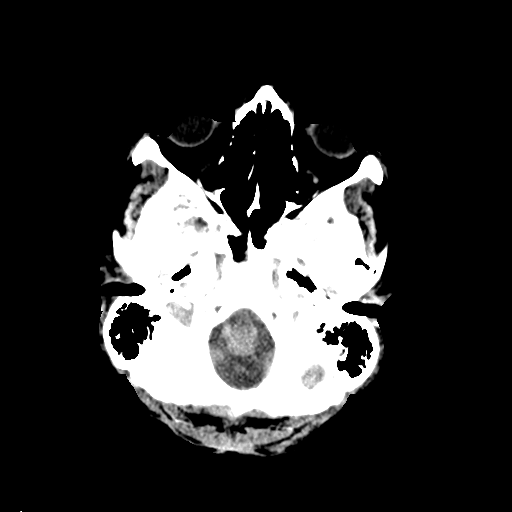
[im 3/30  bone]
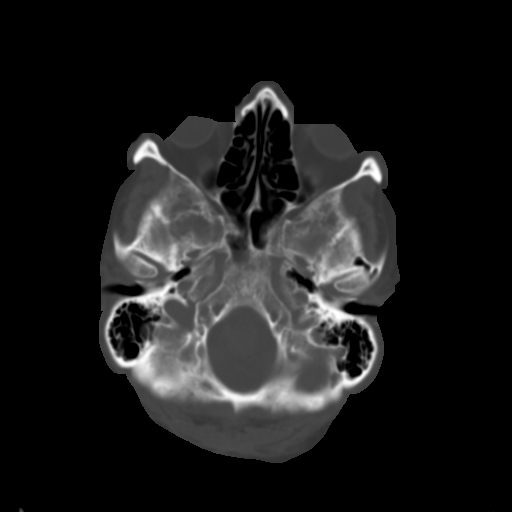
[im 6/30  brain]
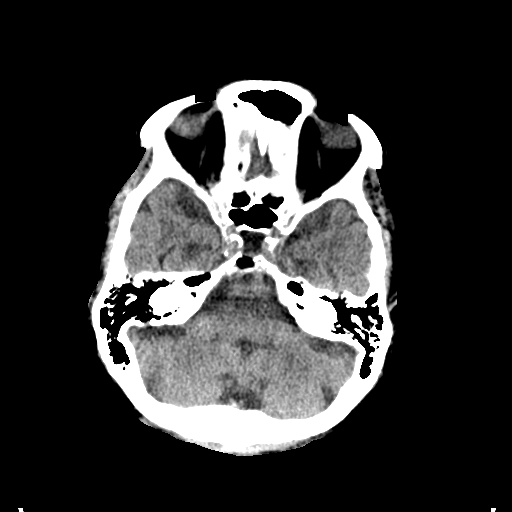
[im 9/30  brain]
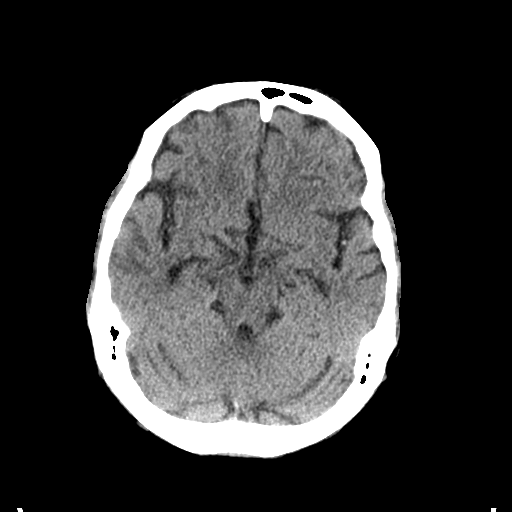
[im 11/30  brain]
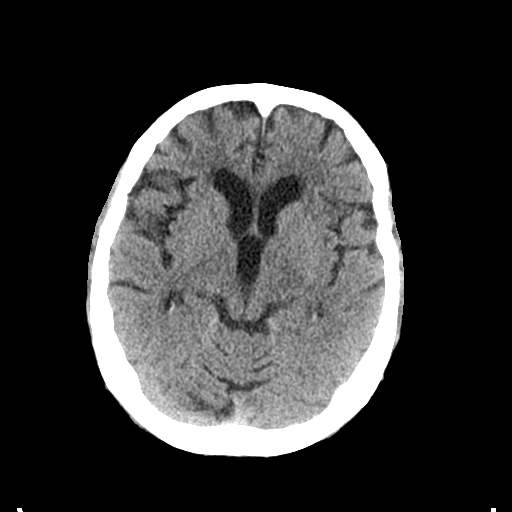
[im 14/30  brain]
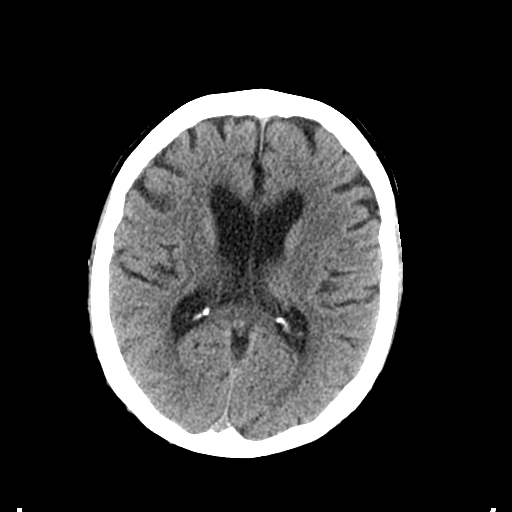
[im 14/30  bone]
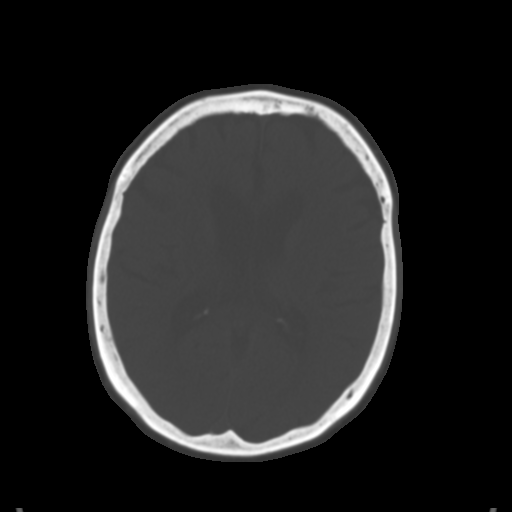
[im 17/30  brain]
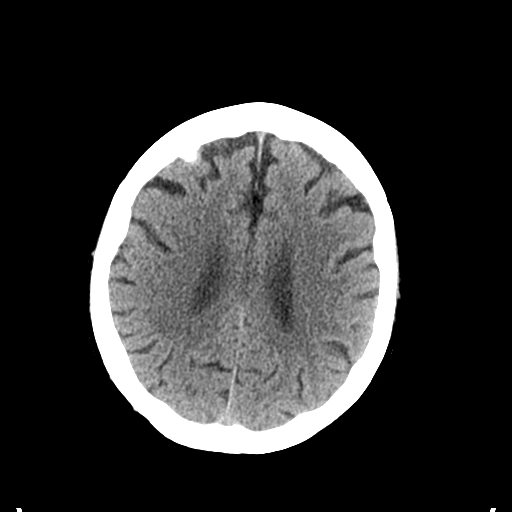
[im 20/30  brain]
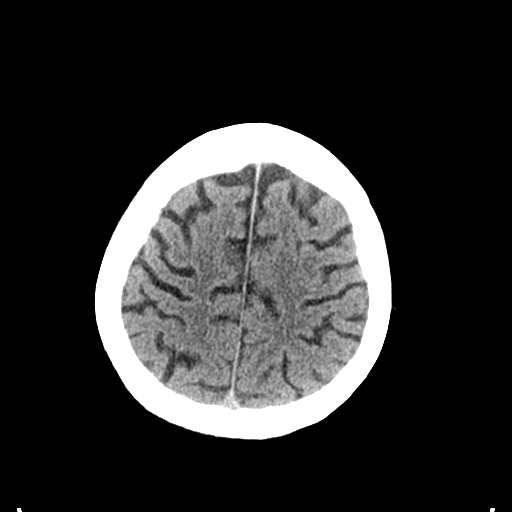
[im 23/30  brain]
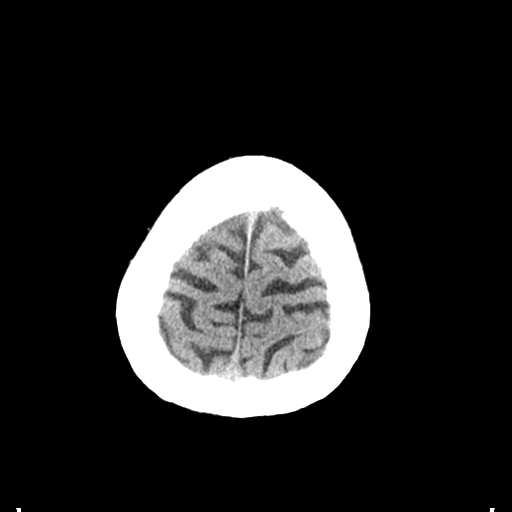
[im 25/30  brain]
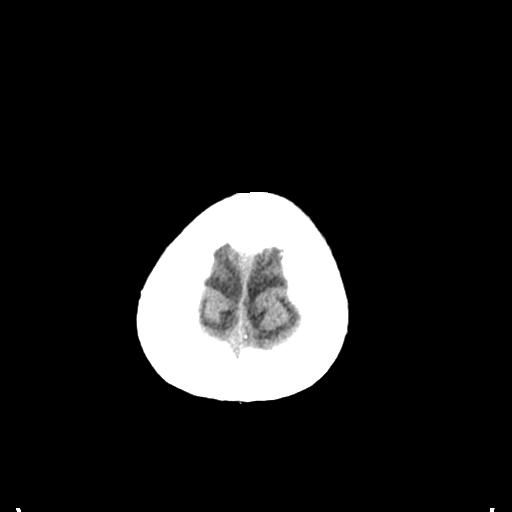
[im 25/30  bone]
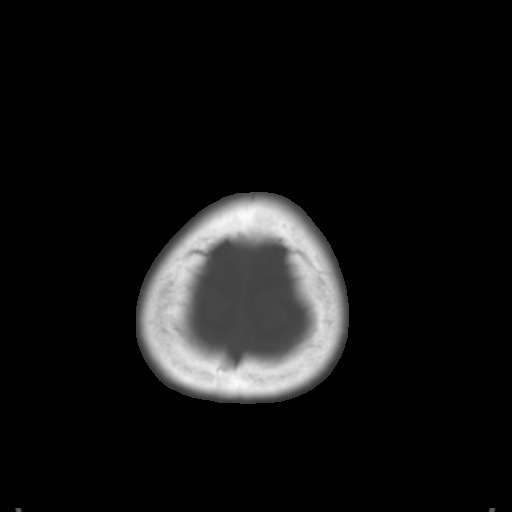
[im 28/30  brain]
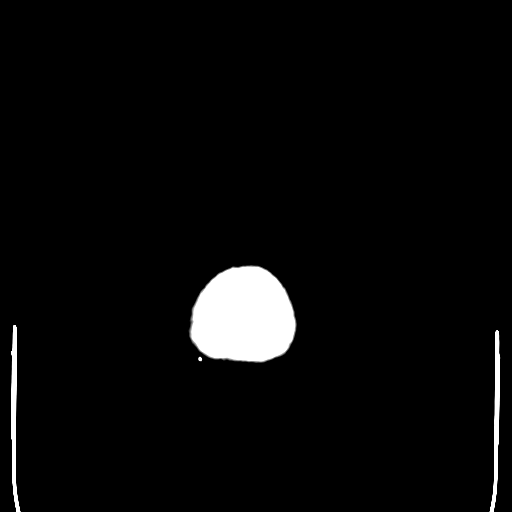

[Series 4: coronal soft tissue · coronal · 0.29mm/px · 3 of 62 slices shown]
[im 21/62  brain]
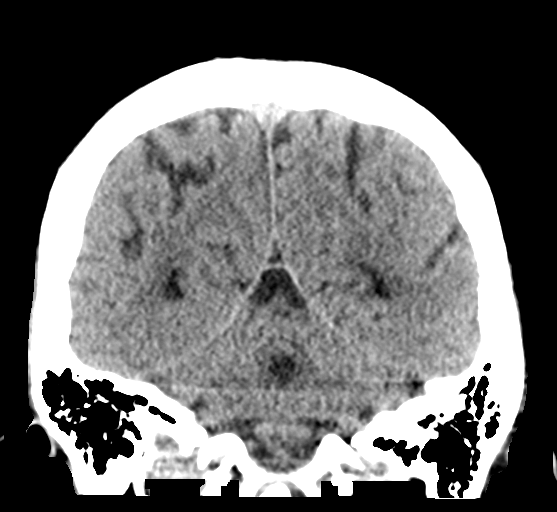
[im 28/62  brain]
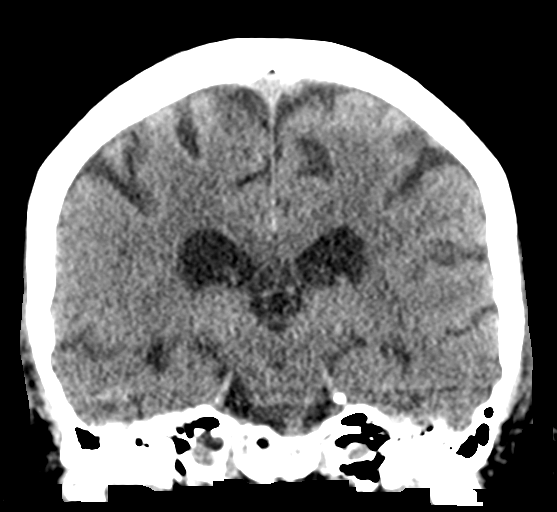
[im 34/62  brain]
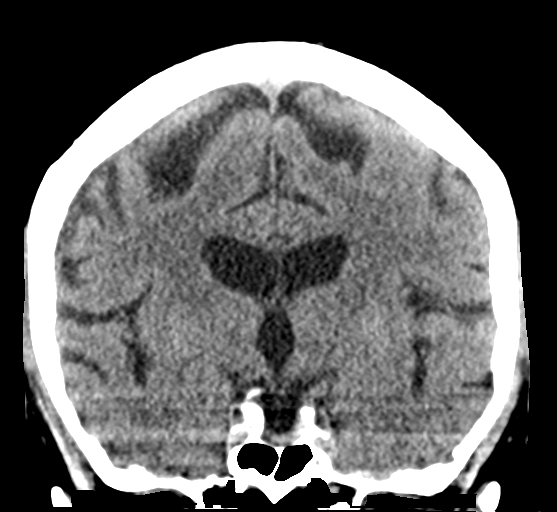

[Series 5: sagittal soft tissue · sagittal · 0.29mm/px · 3 of 55 slices shown]
[im 19/55  brain]
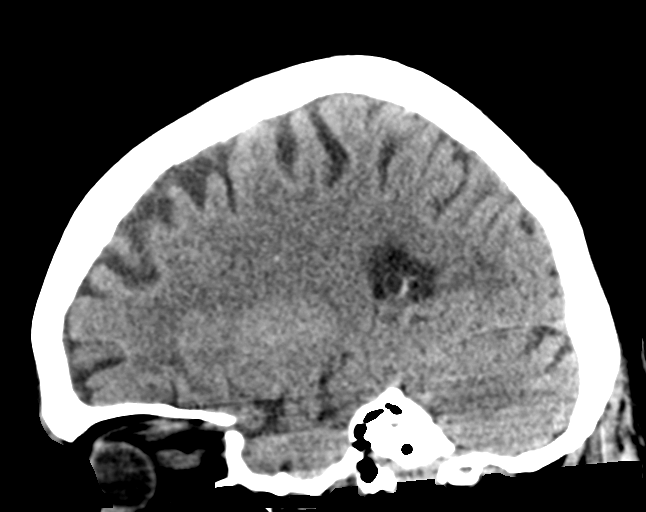
[im 28/55  brain]
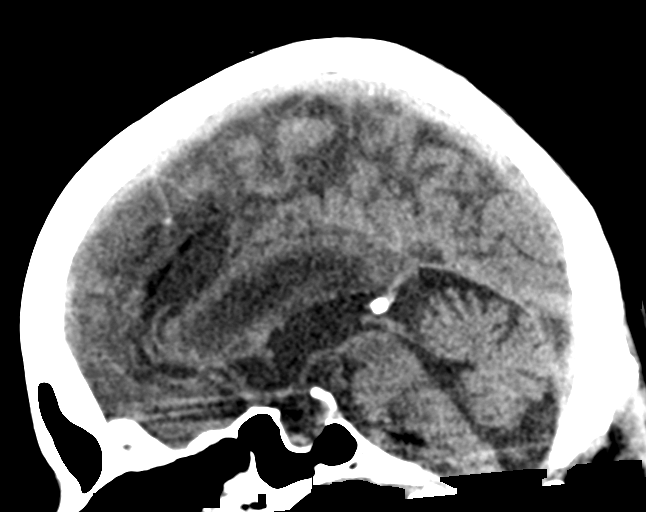
[im 37/55  brain]
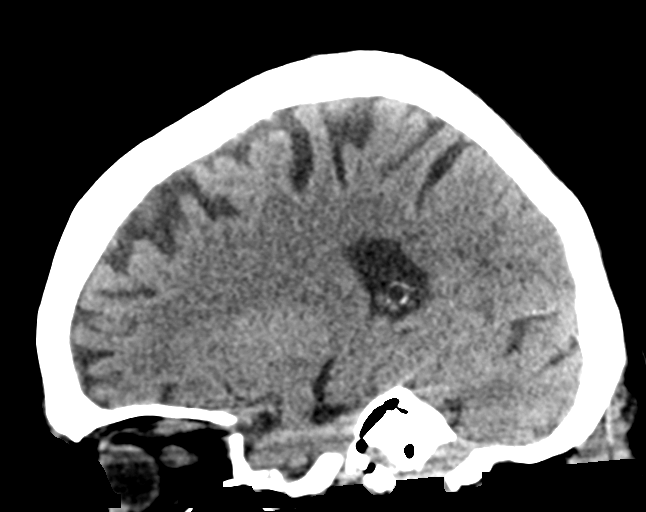

[16 of 47 positions shown; findings below may reference images not displayed]

FINDINGS: Brain: No evidence of acute infarction, hemorrhage, hydrocephalus,
extra-axial collection or mass lesion/mass effect.

Subcortical white matter and periventricular small vessel ischemic
changes. Right frontal meningioma (series 4/image 13), benign.

Vascular: Intracranial atherosclerosis.

Skull: Normal. Negative for fracture or focal lesion.

Sinuses/Orbits: The visualized paranasal sinuses are essentially
clear. The mastoid air cells are unopacified.

Other: None.
IMPRESSION: No evidence of acute intracranial abnormality.

Small vessel ischemic changes.  Right frontal meningioma, benign.

## 2020-05-17 IMAGING — CR DG CHEST 2V
2 series · 2 of 2 positions shown · non-contrast
Comparison: None.

CLINICAL DATA: Acute LEFT chest and rib pain following fall.
Initial encounter.

EXAM:
CHEST - 2 VIEW

[chest pa]
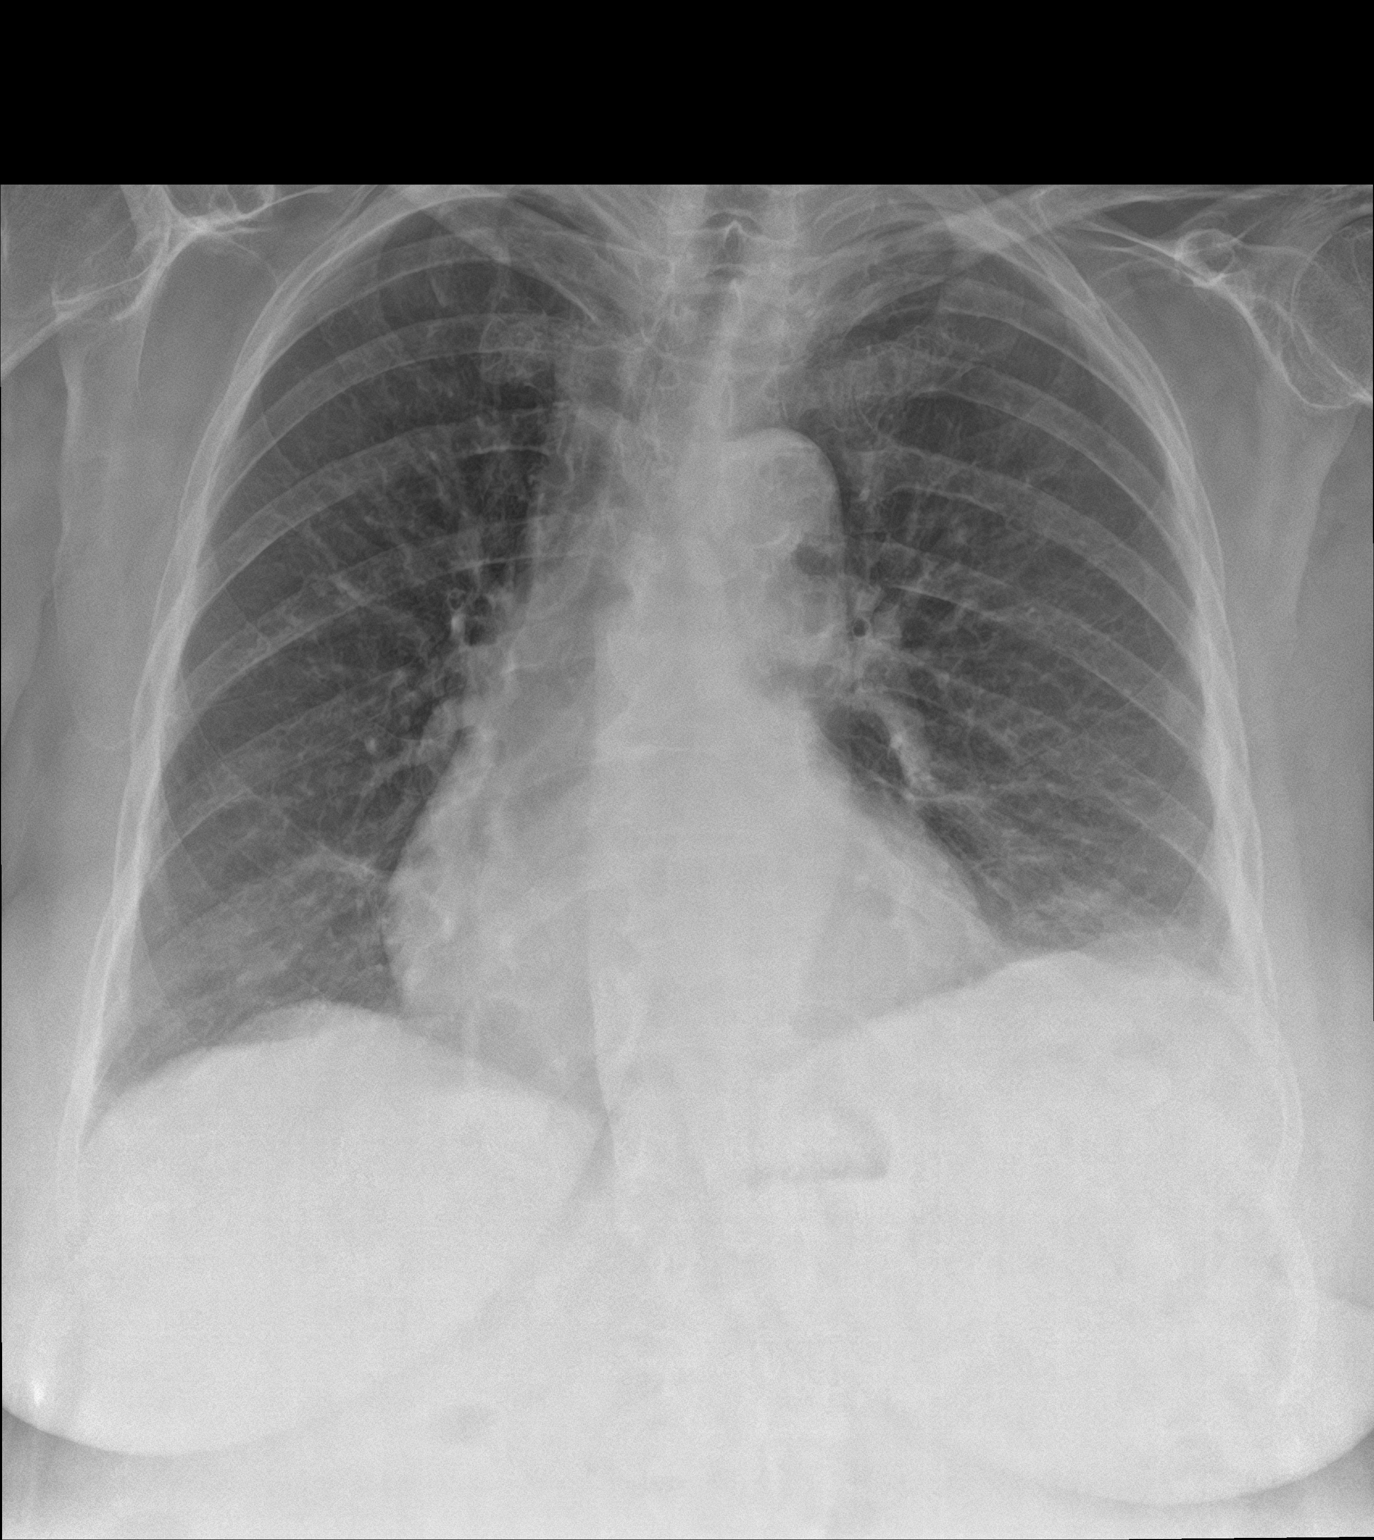

[chest lat]
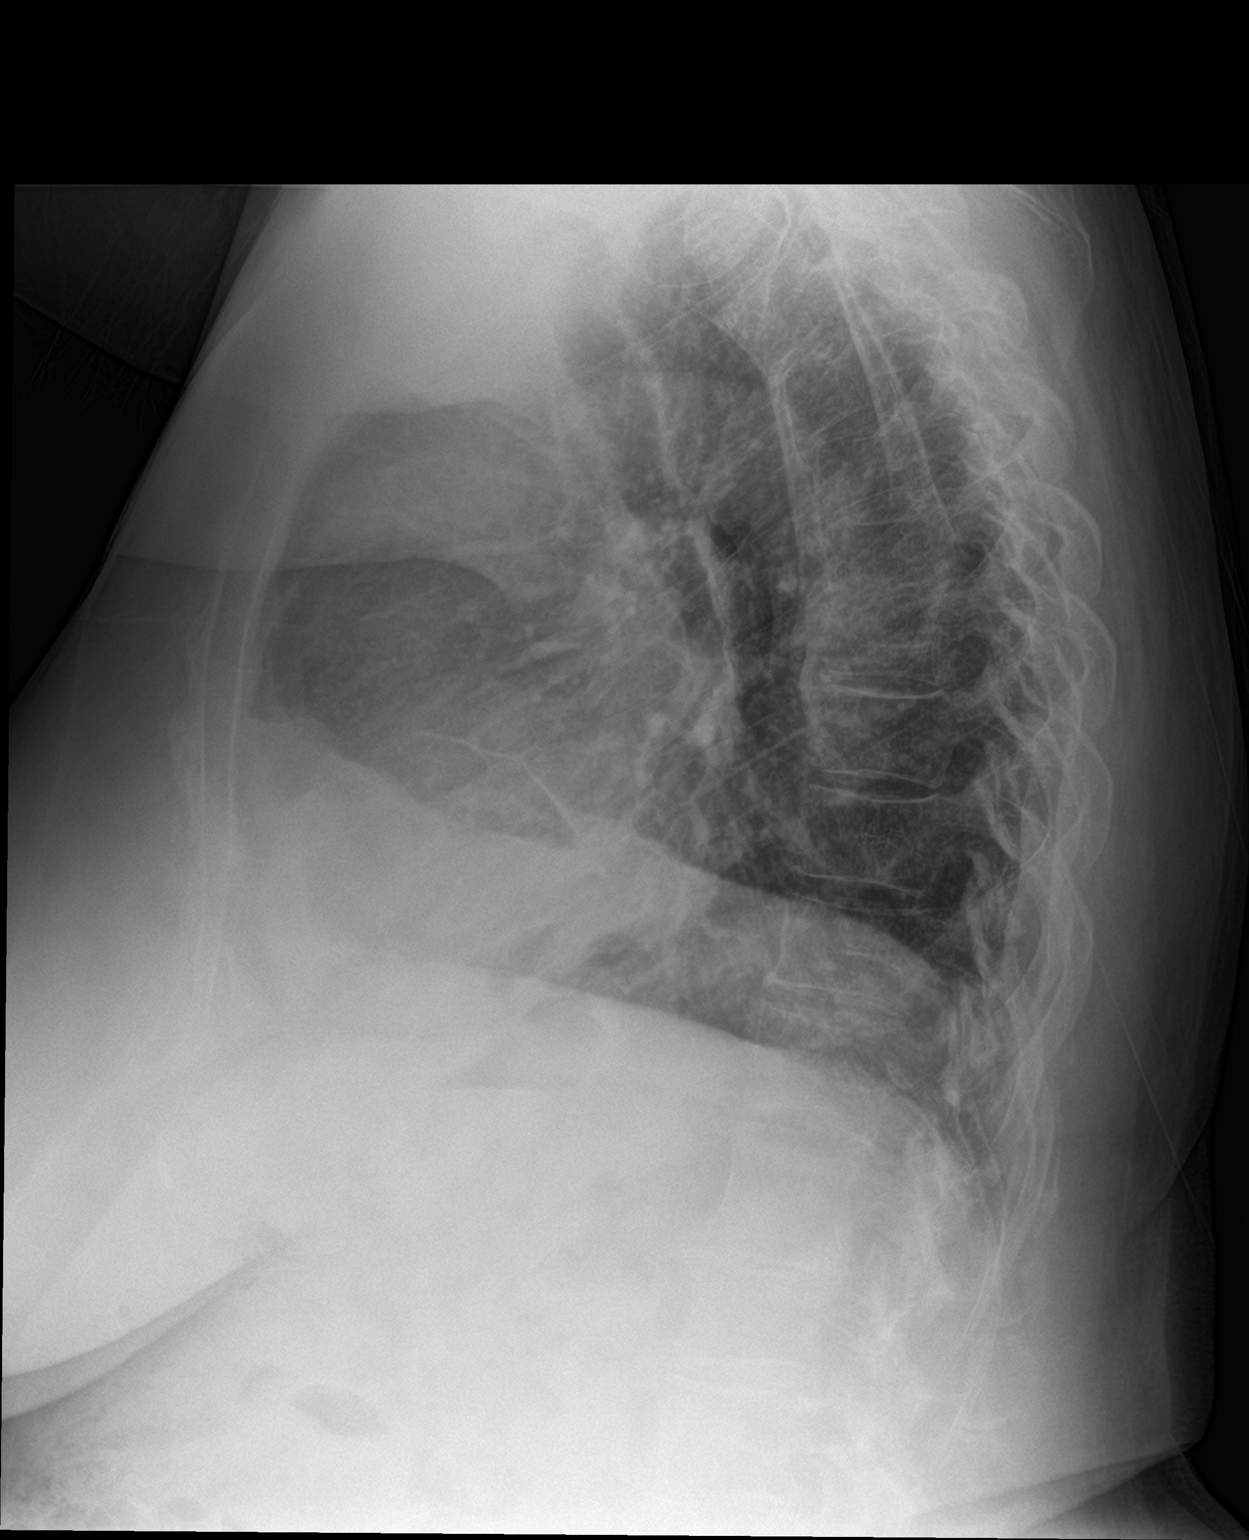

[2 of 2 positions shown; findings below may reference images not displayed]

FINDINGS: Mild cardiomegaly is noted.

Mild LEFT basilar atelectasis versus scarring is noted.

There is no evidence of focal airspace disease, pulmonary edema,
suspicious pulmonary nodule/mass, pleural effusion, or pneumothorax.

No acute bony abnormalities are identified.
IMPRESSION: 1. Mild LEFT basilar atelectasis/scarring.
2. Mild cardiomegaly.

## 2020-06-18 ENCOUNTER — Telehealth: Payer: Self-pay | Admitting: Family Medicine

## 2020-06-18 NOTE — Telephone Encounter (Signed)
Called PT to set up appointment for AWV , no answer left VM

## 2020-06-20 ENCOUNTER — Other Ambulatory Visit: Payer: Self-pay | Admitting: Family Medicine

## 2020-07-18 ENCOUNTER — Other Ambulatory Visit: Payer: Self-pay | Admitting: Family Medicine

## 2020-07-18 ENCOUNTER — Ambulatory Visit: Payer: PPO | Admitting: Neurology

## 2020-09-05 ENCOUNTER — Ambulatory Visit: Payer: PPO | Admitting: Family Medicine

## 2020-09-05 DIAGNOSIS — Z0289 Encounter for other administrative examinations: Secondary | ICD-10-CM

## 2020-09-11 ENCOUNTER — Telehealth: Payer: Self-pay | Admitting: Family Medicine

## 2020-09-11 DIAGNOSIS — I10 Essential (primary) hypertension: Secondary | ICD-10-CM

## 2020-09-11 DIAGNOSIS — E1169 Type 2 diabetes mellitus with other specified complication: Secondary | ICD-10-CM

## 2020-09-11 DIAGNOSIS — E538 Deficiency of other specified B group vitamins: Secondary | ICD-10-CM

## 2020-09-11 DIAGNOSIS — E785 Hyperlipidemia, unspecified: Secondary | ICD-10-CM

## 2020-09-11 DIAGNOSIS — E119 Type 2 diabetes mellitus without complications: Secondary | ICD-10-CM

## 2020-09-11 NOTE — Telephone Encounter (Signed)
-----   Message from Cloyd Stagers, RT sent at 09/09/2020  1:43 PM EST ----- Regarding: Lab Orders for Thursday 1.13.2022 Please place lab orders for Thursday 1.13.2022, office visit for physical on Thursday 1.20.2022 Thank you, Dyke Maes RT(R)

## 2020-09-12 ENCOUNTER — Other Ambulatory Visit (INDEPENDENT_AMBULATORY_CARE_PROVIDER_SITE_OTHER): Payer: PPO

## 2020-09-12 ENCOUNTER — Other Ambulatory Visit: Payer: Self-pay | Admitting: Family Medicine

## 2020-09-12 ENCOUNTER — Other Ambulatory Visit: Payer: Self-pay

## 2020-09-12 DIAGNOSIS — E785 Hyperlipidemia, unspecified: Secondary | ICD-10-CM | POA: Diagnosis not present

## 2020-09-12 DIAGNOSIS — I10 Essential (primary) hypertension: Secondary | ICD-10-CM | POA: Diagnosis not present

## 2020-09-12 DIAGNOSIS — E119 Type 2 diabetes mellitus without complications: Secondary | ICD-10-CM | POA: Diagnosis not present

## 2020-09-12 DIAGNOSIS — E1169 Type 2 diabetes mellitus with other specified complication: Secondary | ICD-10-CM | POA: Diagnosis not present

## 2020-09-12 DIAGNOSIS — E538 Deficiency of other specified B group vitamins: Secondary | ICD-10-CM | POA: Diagnosis not present

## 2020-09-12 LAB — COMPREHENSIVE METABOLIC PANEL
ALT: 10 U/L (ref 0–35)
AST: 14 U/L (ref 0–37)
Albumin: 4.1 g/dL (ref 3.5–5.2)
Alkaline Phosphatase: 89 U/L (ref 39–117)
BUN: 20 mg/dL (ref 6–23)
CO2: 30 mEq/L (ref 19–32)
Calcium: 9.1 mg/dL (ref 8.4–10.5)
Chloride: 105 mEq/L (ref 96–112)
Creatinine, Ser: 0.86 mg/dL (ref 0.40–1.20)
GFR: 62.04 mL/min (ref >60.00)
Glucose, Bld: 106 mg/dL — ABNORMAL HIGH (ref 70–99)
Potassium: 4.1 mEq/L (ref 3.5–5.1)
Sodium: 141 mEq/L (ref 135–145)
Total Bilirubin: 0.7 mg/dL (ref 0.2–1.2)
Total Protein: 6.1 g/dL (ref 6.0–8.3)

## 2020-09-12 LAB — HEMOGLOBIN A1C: Hgb A1c MFr Bld: 6.2 % (ref 4.6–6.5)

## 2020-09-12 LAB — CBC WITH DIFFERENTIAL/PLATELET
Basophils Absolute: 0.1 10*3/uL (ref 0.0–0.1)
Basophils Relative: 1 % (ref 0.0–3.0)
Eosinophils Absolute: 0.5 10*3/uL (ref 0.0–0.7)
Eosinophils Relative: 6.1 % — ABNORMAL HIGH (ref 0.0–5.0)
HCT: 36 % (ref 36.0–46.0)
Hemoglobin: 11.8 g/dL — ABNORMAL LOW (ref 12.0–15.0)
Lymphocytes Relative: 18.5 % (ref 12.0–46.0)
Lymphs Abs: 1.4 10*3/uL (ref 0.7–4.0)
MCHC: 32.8 g/dL (ref 30.0–36.0)
MCV: 88.7 fl (ref 78.0–100.0)
Monocytes Absolute: 0.7 10*3/uL (ref 0.1–1.0)
Monocytes Relative: 9.1 % (ref 3.0–12.0)
Neutro Abs: 5.1 10*3/uL (ref 1.4–7.7)
Neutrophils Relative %: 65.3 % (ref 43.0–77.0)
Platelets: 276 10*3/uL (ref 150.0–400.0)
RBC: 4.06 Mil/uL (ref 3.87–5.11)
RDW: 15 % (ref 11.5–15.5)
WBC: 7.8 10*3/uL (ref 4.0–10.5)

## 2020-09-12 LAB — VITAMIN B12: Vitamin B-12: 146 pg/mL — ABNORMAL LOW (ref 211–911)

## 2020-09-12 LAB — LIPID PANEL
Cholesterol: 132 mg/dL (ref 0–200)
HDL: 40 mg/dL (ref >39.00)
LDL Cholesterol: 71 mg/dL (ref 0–99)
NonHDL: 91.85
Total CHOL/HDL Ratio: 3
Triglycerides: 106 mg/dL (ref 0.0–149.0)
VLDL: 21.2 mg/dL (ref 0.0–40.0)

## 2020-09-12 LAB — TSH: TSH: 3.47 u[IU]/mL (ref 0.35–4.50)

## 2020-09-19 ENCOUNTER — Other Ambulatory Visit: Payer: Self-pay

## 2020-09-19 ENCOUNTER — Encounter: Payer: Self-pay | Admitting: Family Medicine

## 2020-09-19 ENCOUNTER — Ambulatory Visit (INDEPENDENT_AMBULATORY_CARE_PROVIDER_SITE_OTHER): Payer: PPO | Admitting: Family Medicine

## 2020-09-19 VITALS — BP 116/64 | HR 63 | Temp 96.9°F | Ht 64.5 in | Wt 239.2 lb

## 2020-09-19 DIAGNOSIS — I10 Essential (primary) hypertension: Secondary | ICD-10-CM

## 2020-09-19 DIAGNOSIS — H9193 Unspecified hearing loss, bilateral: Secondary | ICD-10-CM

## 2020-09-19 DIAGNOSIS — Z23 Encounter for immunization: Secondary | ICD-10-CM

## 2020-09-19 DIAGNOSIS — E119 Type 2 diabetes mellitus without complications: Secondary | ICD-10-CM

## 2020-09-19 DIAGNOSIS — Z0000 Encounter for general adult medical examination without abnormal findings: Secondary | ICD-10-CM

## 2020-09-19 DIAGNOSIS — E785 Hyperlipidemia, unspecified: Secondary | ICD-10-CM

## 2020-09-19 DIAGNOSIS — R2689 Other abnormalities of gait and mobility: Secondary | ICD-10-CM | POA: Diagnosis not present

## 2020-09-19 DIAGNOSIS — F0391 Unspecified dementia with behavioral disturbance: Secondary | ICD-10-CM | POA: Diagnosis not present

## 2020-09-19 DIAGNOSIS — E1169 Type 2 diabetes mellitus with other specified complication: Secondary | ICD-10-CM | POA: Diagnosis not present

## 2020-09-19 DIAGNOSIS — E538 Deficiency of other specified B group vitamins: Secondary | ICD-10-CM | POA: Diagnosis not present

## 2020-09-19 DIAGNOSIS — H9190 Unspecified hearing loss, unspecified ear: Secondary | ICD-10-CM | POA: Insufficient documentation

## 2020-09-19 DIAGNOSIS — E2839 Other primary ovarian failure: Secondary | ICD-10-CM

## 2020-09-19 MED ORDER — ATORVASTATIN CALCIUM 20 MG PO TABS: 20.0000 mg | ORAL_TABLET | Freq: Every day | ORAL | 3 refills | Status: DC

## 2020-09-19 MED ORDER — FUROSEMIDE 20 MG PO TABS: 20.0000 mg | ORAL_TABLET | Freq: Every day | ORAL | 5 refills | Status: DC

## 2020-09-19 MED ORDER — POTASSIUM CHLORIDE ER 10 MEQ PO TBCR: 10.0000 meq | EXTENDED_RELEASE_TABLET | Freq: Every day | ORAL | 3 refills | Status: DC

## 2020-09-19 MED ORDER — LOSARTAN POTASSIUM 25 MG PO TABS: 25.0000 mg | ORAL_TABLET | Freq: Every day | ORAL | 3 refills | Status: DC

## 2020-09-19 MED ORDER — METFORMIN HCL 500 MG PO TABS: 500.0000 mg | ORAL_TABLET | Freq: Two times a day (BID) | ORAL | 3 refills | Status: DC

## 2020-09-19 MED ORDER — CYANOCOBALAMIN 1000 MCG/ML IJ SOLN
1000.0000 ug | Freq: Once | INTRAMUSCULAR | Status: AC
  Administered 2020-09-19: 1000 ug via INTRAMUSCULAR

## 2020-09-19 NOTE — Assessment & Plan Note (Signed)
Failed hearing screen  Disc safety issues with loss of hearing  Ref for audiology done -would consider hearing aides

## 2020-09-19 NOTE — Assessment & Plan Note (Signed)
Lab Results  Component Value Date   HGBA1C 6.2 09/12/2020   disc imp of low glycemic diet and wt loss to prevent DM2  Plan to continue metformin 500 mg bid  On arb and statin  Sent for last dm eye exam report

## 2020-09-19 NOTE — Assessment & Plan Note (Signed)
Lab Results  Component Value Date   VITAMINB12 146 (L) 09/12/2020   Plan to start B12 shot every 6 wk  First today  Enc B12 supplement daily otc

## 2020-09-19 NOTE — Assessment & Plan Note (Signed)
dexa ordered

## 2020-09-19 NOTE — Assessment & Plan Note (Signed)
Discussed how this problem influences overall health and the risks it imposes  Reviewed plan for weight loss with lower calorie diet (via better food choices and also portion control or program like weight watchers) and exercise building up to or more than 30 minutes 5 days per week including some aerobic activity   Exercise is challenging with poor balance

## 2020-09-19 NOTE — Assessment & Plan Note (Signed)
Disc goals for lipids and reasons to control them Rev last labs with pt Rev low sat fat diet in detail  Good control with atorvastatin 20 mg daily  LDL 71   Diet not optimal, she eats sausage biscuits and hamburger

## 2020-09-19 NOTE — Assessment & Plan Note (Signed)
Continues to use walker  Would like shower chair but they are too low to get up from  Disc fall prevention

## 2020-09-19 NOTE — Assessment & Plan Note (Signed)
Reviewed health habits including diet and exercise and skin cancer prevention Reviewed appropriate screening tests for age  Also reviewed health mt list, fam hx and immunization status , as well as social and family history   See HPI Labs reviewed  Unsure if shingrix would be covered  covid vaccinated-needs booster (plans on that)  dexa ordered  Pt will schedule mammogram also  Disc fall prevention  Adv ca and D for bone health and exercise as tolerated Adv directive is utd Sees neuro for dementia / cog fxn discussed (has home care for safety) Poor hearing screen -audiology ref done  Sent for DM eye exam report , denies vision problems after cataract surgery

## 2020-09-19 NOTE — Progress Notes (Signed)
Subjective:    Patient ID: Hannah Duran, female    DOB: 1934/01/16, 87 y.o.   MRN: [id number]  This visit occurred during the SARS-CoV-2 public health emergency.  Safety protocols were in place, including screening questions prior to the visit, additional usage of staff PPE, and extensive cleaning of exam room while observing appropriate contact time as indicated for disinfecting solutions.    HPI Pt presents for amw and health mt visit  I have personally reviewed the Medicare Annual Wellness questionnaire and have noted 1. The patient's medical and social history 2. Their use of alcohol, tobacco or illicit drugs 3. Their current medications and supplements 4. The patient's functional ability including ADL's, fall risks, home safety risks and hearing or visual             impairment. 5. Diet and physical activities 6. Evidence for depression or mood disorders  The patients weight, height, BMI have been recorded in the chart and visual acuity is per eye clinic.  I have made referrals, counseling and provided education to the patient based review of the above and I have provided the pt with a written personalized care plan for preventive services. Reviewed and updated provider list, see scanned forms.  See scanned forms.  Routine anticipatory guidance given to patient.  See health maintenance. Colon cancer screening-out aged  Breast cancer screening  Mammogram 10/19  Self breast exam Flu vaccine- today  Tetanus vaccine  Tdap 4/12 Pneumovax up to date  Zoster vaccine-? covered covid vaccine p[employer] march/april Dexa 10/14 Falls- had a fall in the shower last week  (she has help at home)  3 falls this year (has slid out of a chair)  Getting out of a low chair is hard  Fractures -none  Supplements-none  Exercise - uses a walker , getting around the house  Likes to get out if a warm day  Advance directive-up to date  Cognitive function addressed- see scanned forms- and if  abnormal then additional documentation follows.   Has a history of dementia /takes seroquel  Cannot remember the day  Worse with age/progressive (worse in darker months)  Per pt mood ok   PMH and SH reviewed  Meds, vitals, and allergies reviewed.   ROS: See HPI.  Otherwise negative.    Weight : Wt Readings from Last 3 Encounters:  09/19/20 239 lb 4 oz (108.5 kg)  03/21/20 (!) 240 lb 5 oz (109 kg)  02/27/20 242 lb 8.1 oz (110 kg)   40.43 kg/m She tries to put more vegetables in her diet    Hearing/vision:  Hearing Screening   125Hz  250Hz  500Hz  1000Hz  2000Hz  3000Hz  4000Hz  6000Hz  8000Hz   Right ear:   0 0 0  0    Left ear:   0 0 0  0     Her hearing is "not too good"  Per pt utd vision/eye care  [facility]  No big changes in vision   Had cataract surgery   Care team [facility] Schiele-pcp [place]- derm  [facility] NP and Krista Blue - neuro [facility]re[facility]- podiatry   HTN bp is stable today  No cp or palpitations or headaches or edema  No side effects to medicines  BP Readings from Last 3 Encounters:  09/19/20 116/64  03/21/20 (!) 130/66  02/28/20 (!) 144/79    Takes losartan 25 mg daily  lasix 20 mg daily (also K)  Pulse Readings from Last 3 Encounters:  09/19/20 63  03/21/20 67  02/28/20 72  Lab Results  Component Value Date   CREATININE 0.86 09/12/2020   BUN 20 09/12/2020   NA 141 09/12/2020   K 4.1 09/12/2020   CL 105 09/12/2020   CO2 30 09/12/2020    DM2 Lab Results  Component Value Date   HGBA1C 6.2 09/12/2020  metformin 500 mg bid -tolerates  Improved On arb and statin  Eye exam -thinks utd   Hyperlipidemia Lab Results  Component Value Date   CHOL 132 09/12/2020   CHOL 195 06/05/2019   CHOL 196 01/05/2019   Lab Results  Component Value Date   HDL 40.00 09/12/2020   HDL 41.80 06/05/2019   HDL 39.20 01/05/2019   Lab Results  Component Value Date   LDLCALC 71 09/12/2020   LDLCALC 130 (H) 06/05/2019   LDLCALC 125 (H) 01/05/2019   Lab Results   Component Value Date   TRIG 106.0 09/12/2020   TRIG 118.0 06/05/2019   TRIG 161.0 (H) 01/05/2019   Lab Results  Component Value Date   CHOLHDL 3 09/12/2020   CHOLHDL 5 06/05/2019   CHOLHDL 5 01/05/2019   No results found for: LDLDIRECT Atorvastatin 20 mg daily  Has eaten a lot of hamburger   Vit B12 def Lab Results  Component Value Date   VITAMINB12 146 (L) 09/12/2020   Baseline anemia Lab Results  Component Value Date   WBC 7.8 09/12/2020   HGB 11.8 (L) 09/12/2020   HCT 36.0 09/12/2020   MCV 88.7 09/12/2020   PLT 276.0 09/12/2020   Patient Active Problem List   Diagnosis Date Noted  . Estrogen deficiency 09/19/2020  . Hearing loss 09/19/2020  . Leg weakness, bilateral 03/21/2020  . Hypokalemia 03/21/2020  . Dementia ([place]) 01/18/2020  . Vaginal discharge 12/11/2019  . Poor balance 06/29/2019  . Vitamin B12 deficiency 03/29/2019  . Visual hallucinations 01/05/2019  . History of recurrent UTIs 01/05/2019  . Encounter for screening mammogram for breast cancer 05/28/2017  . Routine general medical examination at a health care facility 05/19/2016  . Ankle edema 05/01/2015  . Venous (peripheral) insufficiency 05/01/2015  . Encounter for Medicare annual wellness exam 06/02/2013  . Internal hemorrhoid 06/21/2012  . Morbid obesity ([place])   . HTN (hypertension)   . Hyperlipidemia associated with type 2 diabetes mellitus ([place])   . Diabetes type 2, controlled ([place])    Past Medical History:  Diagnosis Date  . Anxiety   . Congenital foot deformity   . Dermatophytosis   . Hallucinations   . HLD (hyperlipidemia)   . HTN (hypertension)   . Hyperglycemia   . Irregular heart rhythm    pt unsure of what type; followed by PCP only  . Ischemic colitis ([place]) 7/13   on colonoscopy  . Memory loss   . Obesity   . Rotator cuff syndrome    Past Surgical History:  Procedure Laterality Date  . CATARACT EXTRACTION W/PHACO Right 09/09/2016   Procedure: CATARACT EXTRACTION  PHACO AND INTRAOCULAR LENS PLACEMENT (IOC);  Surgeon: Leandrew Koyanagi, MD;  Location: [facility];  Service: Ophthalmology;  Laterality: Right;  right  . CATARACT EXTRACTION W/PHACO Left 12/16/2016   Procedure: CATARACT EXTRACTION PHACO AND INTRAOCULAR LENS PLACEMENT ([place])  Left;  Surgeon: Leandrew Koyanagi, MD;  Location: [facility];  Service: Ophthalmology;  Laterality: Left;  . HUMERUS FRACTURE SURGERY  10/1997  . TOTAL ABDOMINAL HYSTERECTOMY  1980's   due to menometrorrhagia   Social History   Tobacco Use  . Smoking status: Never Smoker  . Smokeless  tobacco: Never Used  Vaping Use  . Vaping Use: Never used  Substance Use Topics  . Alcohol use: No  . Drug use: No   Family History  Problem Relation Age of Onset  . Stroke Mother   . Parkinsonism Mother   . Hypertension Mother   . Alzheimer's disease Mother   . Cancer Father        bladder/prostate  . Breast cancer Neg Hx    Allergies  Allergen Reactions  . Penicillins     REACTION: Rash  . Sulfamethoxazole-Trimethoprim     rash  . Amoxicillin Rash   Current Outpatient Medications on File Prior to Visit  Medication Sig Dispense Refill  . albuterol (VENTOLIN HFA) 108 (90 Base) MCG/ACT inhaler Inhale 2 puffs into the lungs every 4 (four) hours as needed for wheezing. 1 each 1  . QUEtiapine (SEROQUEL) 25 MG tablet Take 1 tablet (25 mg total) by mouth at bedtime. 30 tablet 11   No current facility-administered medications on file prior to visit.    Review of Systems  Constitutional: Negative for activity change, appetite change, fatigue, fever and unexpected weight change.  HENT: Negative for congestion, ear pain, rhinorrhea, sinus pressure and sore throat.   Eyes: Negative for pain, redness and visual disturbance.  Respiratory: Negative for cough, shortness of breath and wheezing.   Cardiovascular: Negative for chest pain and palpitations.  Gastrointestinal: Negative for abdominal pain, blood in  stool, constipation and diarrhea.  Endocrine: Negative for polydipsia and polyuria.  Genitourinary: Negative for dysuria, frequency and urgency.  Musculoskeletal: Positive for arthralgias and back pain. Negative for myalgias.  Skin: Negative for pallor and rash.  Allergic/Immunologic: Negative for environmental allergies.  Neurological: Negative for dizziness, syncope and headaches.       Poor balance  Hematological: Negative for adenopathy. Does not bruise/bleed easily.  Psychiatric/Behavioral: Positive for confusion and hallucinations. Negative for decreased concentration and dysphoric mood. The patient is not nervous/anxious.        Dementia         Objective:   Physical Exam Constitutional:      General: She is not in acute distress.    Appearance: Normal appearance. She is well-developed. She is not ill-appearing or diaphoretic.  HENT:     Head: Normocephalic and atraumatic.     Right Ear: Tympanic membrane, ear canal and external ear normal.     Left Ear: Tympanic membrane, ear canal and external ear normal.     Nose: Nose normal. No congestion.     Mouth/Throat:     Mouth: Mucous membranes are moist.     Pharynx: Oropharynx is clear. No posterior oropharyngeal erythema.  Eyes:     General: No scleral icterus.    Extraocular Movements: Extraocular movements intact.     Conjunctiva/sclera: Conjunctivae normal.     Pupils: Pupils are equal, round, and reactive to light.  Neck:     Thyroid: No thyromegaly.     Vascular: No carotid bruit or JVD.  Cardiovascular:     Rate and Rhythm: Normal rate and regular rhythm.     Pulses: Normal pulses.     Heart sounds: Normal heart sounds. No gallop.   Pulmonary:     Effort: Pulmonary effort is normal. No respiratory distress.     Breath sounds: Normal breath sounds. No wheezing.     Comments: Good air exch Chest:     Chest wall: No tenderness.  Abdominal:     General: Bowel sounds are normal. There  is no distension or abdominal  bruit.     Palpations: Abdomen is soft. There is no mass.     Tenderness: There is no abdominal tenderness.     Hernia: No hernia is present.  Musculoskeletal:        General: No tenderness. Normal range of motion.     Cervical back: Normal range of motion and neck supple. No rigidity. No muscular tenderness.     Right lower leg: No edema.     Left lower leg: No edema.     Comments: No kyphosis   Lymphadenopathy:     Cervical: No cervical adenopathy.  Skin:    General: Skin is warm and dry.     Coloration: Skin is not pale.     Findings: No erythema or rash.     Comments: Solar lentigines diffusely Some sks   Neurological:     Mental Status: She is alert. Mental status is at baseline.     Cranial Nerves: No cranial nerve deficit.     Motor: No abnormal muscle tone.     Coordination: Coordination normal.     Gait: Gait normal.     Deep Tendon Reflexes: Reflexes are normal and symmetric. Reflexes normal.  Psychiatric:        Mood and Affect: Mood normal.        Behavior: Behavior normal.     Comments: Pleasant  Impaired short term memory  Some confabulation            Assessment & Plan:   Problem List Items Addressed This Visit      Cardiovascular and Mediastinum   HTN (hypertension)    bp in fair control at this time  BP Readings from Last 1 Encounters:  09/19/20 116/64   No changes needed Most recent labs reviewed  Disc lifstyle change with low sodium diet and exercise  Plan to continue losartan 25 mg daily and lasix 20 mg daily       Relevant Medications   atorvastatin (LIPITOR) 20 MG tablet   furosemide (LASIX) 20 MG tablet   losartan (COZAAR) 25 MG tablet     Endocrine   Hyperlipidemia associated with type 2 diabetes mellitus (HCC)    Disc goals for lipids and reasons to control them Rev last labs with pt Rev low sat fat diet in detail  Good control with atorvastatin 20 mg daily  LDL 71   Diet not optimal, she eats sausage biscuits and hamburger        Relevant Medications   atorvastatin (LIPITOR) 20 MG tablet   losartan (COZAAR) 25 MG tablet   metFORMIN (GLUCOPHAGE) 500 MG tablet   Diabetes type 2, controlled (HCC)    Lab Results  Component Value Date   HGBA1C 6.2 09/12/2020   disc imp of low glycemic diet and wt loss to prevent DM2  Plan to continue metformin 500 mg bid  On arb and statin  Sent for last dm eye exam report        Relevant Medications   atorvastatin (LIPITOR) 20 MG tablet   losartan (COZAAR) 25 MG tablet   metFORMIN (GLUCOPHAGE) 500 MG tablet     Nervous and Auditory   Dementia (HCC)    With hallucinations (those have improved)  No parkinsons features  Sees neurology Taking seroquel  Memory loss is progressive (discussed namenda with neurology but decided to hold off for now)  Has home care        Hearing loss  Failed hearing screen  Disc safety issues with loss of hearing  Ref for audiology done -would consider hearing aides       Relevant Orders   Ambulatory referral to Audiology     Other   Morbid obesity [facility])    Discussed how this problem influences overall health and the risks it imposes  Reviewed plan for weight loss with lower calorie diet (via better food choices and also portion control or program like weight watchers) and exercise building up to or more than 30 minutes 5 days per week including some aerobic activity   Exercise is challenging with poor balance      Relevant Medications   metFORMIN (GLUCOPHAGE) 500 MG tablet   Encounter for Medicare annual wellness exam - Primary    Reviewed health habits including diet and exercise and skin cancer prevention Reviewed appropriate screening tests for age  Also reviewed health mt list, fam hx and immunization status , as well as social and family history   See HPI Labs reviewed  Unsure if shingrix would be covered  covid vaccinated-needs booster (plans on that)  dexa ordered  Pt will schedule mammogram also  Disc fall  prevention  Adv ca and D for bone health and exercise as tolerated Adv directive is utd Sees neuro for dementia / cog fxn discussed (has home care for safety) Poor hearing screen -audiology ref done  Sent for DM eye exam report , denies vision problems after cataract surgery       Routine general medical examination at a health care facility    Reviewed health habits including diet and exercise and skin cancer prevention Reviewed appropriate screening tests for age  Also reviewed health mt list, fam hx and immunization status , as well as social and family history   See HPI Labs reviewed  Unsure if shingrix would be covered  covid vaccinated-needs booster (plans on that)  dexa ordered  Pt will schedule mammogram also  Disc fall prevention  Adv ca and D for bone health and exercise as tolerated Adv directive is utd Sees neuro for dementia / cog fxn discussed (has home care for safety) Poor hearing screen -audiology ref done  Sent for DM eye exam report , denies vision problems after cataract surgery        Vitamin B12 deficiency    Lab Results  Component Value Date   VITAMINB12 146 (L) 09/12/2020   Plan to start B12 shot every 6 wk  First today  Enc B12 supplement daily otc       Poor balance    Continues to use walker  Would like shower chair but they are too low to get up from  Disc fall prevention       Estrogen deficiency    dexa ordered       Relevant Orders   DG Bone Density    Other Visit Diagnoses    Need for influenza vaccination       Relevant Orders   Flu Vaccine QUAD High Dose(Fluad) (Completed)

## 2020-09-19 NOTE — Assessment & Plan Note (Addendum)
With hallucinations (those have improved)  No parkinsons features  Sees neurology Taking seroquel  Memory loss is progressive (discussed namenda with neurology but decided to hold off for now)  Has home care

## 2020-09-19 NOTE — Assessment & Plan Note (Signed)
Reviewed health habits including diet and exercise and skin cancer prevention Reviewed appropriate screening tests for age  Also reviewed health mt list, fam hx and immunization status , as well as social and family history   See HPI Labs reviewed  Unsure if shingrix would be covered  covid vaccinated-needs booster (plans on that)  dexa ordered  Pt will schedule mammogram also  Disc fall prevention  Adv ca and D for bone health and exercise as tolerated Adv directive is utd Sees neuro for dementia / cog fxn discussed (has home care for safety) Poor hearing screen -audiology ref done  Sent for DM eye exam report , denies vision problems after cataract surgery   

## 2020-09-19 NOTE — Patient Instructions (Addendum)
Flu shot today  Get your covid booster whenever you can   Schedule your mammogram and bone density at [facility]   Try to get 1200-1500 mg of calcium per day with at least 1000 iu of vitamin D - for bone health   For cholesterol Avoid red meat/ fried foods/ egg yolks/ fatty breakfast meats/ butter, cheese and high fat dairy/ and shellfish    B12 shot today  Let's get you on a schedule for one B12 shot every 6 weeks  You can also take over the counter B complex vitamin every day   The office will call you to set up an audiology referral for hearing

## 2020-09-19 NOTE — Assessment & Plan Note (Signed)
bp in fair control at this time  BP Readings from Last 1 Encounters:  09/19/20 116/64   No changes needed Most recent labs reviewed  Disc lifstyle change with low sodium diet and exercise  Plan to continue losartan 25 mg daily and lasix 20 mg daily

## 2020-09-20 ENCOUNTER — Telehealth: Payer: Self-pay

## 2020-09-20 NOTE — Telephone Encounter (Signed)
Called patient to ask if she has preference on location for Audiology referral. No answer and mailbox is not set up

## 2020-09-27 NOTE — Telephone Encounter (Signed)
Spoke with patient. See referral notes. 

## 2020-10-17 ENCOUNTER — Other Ambulatory Visit: Payer: Self-pay | Admitting: Family Medicine

## 2020-10-24 ENCOUNTER — Other Ambulatory Visit: Payer: Self-pay | Admitting: Family Medicine

## 2020-10-24 NOTE — Telephone Encounter (Signed)
?   The directions on the lasix, Rx was sent in on 09/19/20 at [facility] but directions say take one tab daily. Take one tab twice a week, please clarify

## 2020-10-25 ENCOUNTER — Telehealth: Payer: Self-pay

## 2020-10-25 NOTE — Telephone Encounter (Signed)
Reviewed and it's not just a FL2 there are multiple forms, will put in PCP's inbox for review

## 2020-10-25 NOTE — Telephone Encounter (Signed)
Patients daughter dropped off FL2 form to be filled out and would like it mailed in when done.    Please advise

## 2020-10-26 ENCOUNTER — Other Ambulatory Visit: Payer: Self-pay | Admitting: Family Medicine

## 2020-10-27 NOTE — Telephone Encounter (Signed)
Will need to fill in current level and new level of care and ask about continence and ambulatory status  I will put form in IN box Thanks

## 2020-10-29 NOTE — Telephone Encounter (Signed)
Spoke with son, he answered questions and forms faxed to [facility], copy sent for scanning

## 2020-10-31 ENCOUNTER — Ambulatory Visit: Payer: PPO

## 2020-10-31 ENCOUNTER — Other Ambulatory Visit: Payer: Self-pay

## 2020-11-23 DIAGNOSIS — R600 Localized edema: Secondary | ICD-10-CM | POA: Diagnosis not present

## 2020-11-23 DIAGNOSIS — L03119 Cellulitis of unspecified part of limb: Secondary | ICD-10-CM | POA: Diagnosis not present

## 2020-11-28 DIAGNOSIS — F0390 Unspecified dementia without behavioral disturbance: Secondary | ICD-10-CM | POA: Diagnosis not present

## 2020-11-28 DIAGNOSIS — I10 Essential (primary) hypertension: Secondary | ICD-10-CM | POA: Diagnosis not present

## 2020-11-28 DIAGNOSIS — E119 Type 2 diabetes mellitus without complications: Secondary | ICD-10-CM | POA: Diagnosis not present

## 2020-11-28 DIAGNOSIS — R5381 Other malaise: Secondary | ICD-10-CM | POA: Diagnosis not present

## 2020-11-28 DIAGNOSIS — L0390 Cellulitis, unspecified: Secondary | ICD-10-CM | POA: Diagnosis not present

## 2020-11-28 DIAGNOSIS — R601 Generalized edema: Secondary | ICD-10-CM | POA: Diagnosis not present

## 2020-11-28 DIAGNOSIS — R600 Localized edema: Secondary | ICD-10-CM | POA: Diagnosis not present

## 2020-11-28 DIAGNOSIS — E785 Hyperlipidemia, unspecified: Secondary | ICD-10-CM | POA: Diagnosis not present

## 2020-12-02 DIAGNOSIS — E1169 Type 2 diabetes mellitus with other specified complication: Secondary | ICD-10-CM | POA: Diagnosis not present

## 2020-12-02 DIAGNOSIS — R600 Localized edema: Secondary | ICD-10-CM | POA: Diagnosis not present

## 2020-12-02 DIAGNOSIS — F0390 Unspecified dementia without behavioral disturbance: Secondary | ICD-10-CM | POA: Diagnosis not present

## 2020-12-02 DIAGNOSIS — Z7984 Long term (current) use of oral hypoglycemic drugs: Secondary | ICD-10-CM | POA: Diagnosis not present

## 2020-12-02 DIAGNOSIS — I872 Venous insufficiency (chronic) (peripheral): Secondary | ICD-10-CM | POA: Diagnosis not present

## 2020-12-02 DIAGNOSIS — L03116 Cellulitis of left lower limb: Secondary | ICD-10-CM | POA: Diagnosis not present

## 2020-12-02 DIAGNOSIS — L03115 Cellulitis of right lower limb: Secondary | ICD-10-CM | POA: Diagnosis not present

## 2020-12-02 DIAGNOSIS — M6281 Muscle weakness (generalized): Secondary | ICD-10-CM | POA: Diagnosis not present

## 2020-12-02 DIAGNOSIS — E785 Hyperlipidemia, unspecified: Secondary | ICD-10-CM | POA: Diagnosis not present

## 2020-12-02 DIAGNOSIS — I10 Essential (primary) hypertension: Secondary | ICD-10-CM | POA: Diagnosis not present

## 2020-12-03 DIAGNOSIS — E119 Type 2 diabetes mellitus without complications: Secondary | ICD-10-CM | POA: Diagnosis not present

## 2020-12-03 DIAGNOSIS — E559 Vitamin D deficiency, unspecified: Secondary | ICD-10-CM | POA: Diagnosis not present

## 2020-12-03 DIAGNOSIS — D518 Other vitamin B12 deficiency anemias: Secondary | ICD-10-CM | POA: Diagnosis not present

## 2020-12-03 DIAGNOSIS — E7849 Other hyperlipidemia: Secondary | ICD-10-CM | POA: Diagnosis not present

## 2020-12-03 DIAGNOSIS — E038 Other specified hypothyroidism: Secondary | ICD-10-CM | POA: Diagnosis not present

## 2020-12-03 DIAGNOSIS — Z79899 Other long term (current) drug therapy: Secondary | ICD-10-CM | POA: Diagnosis not present

## 2020-12-04 ENCOUNTER — Telehealth: Payer: Self-pay | Admitting: Family Medicine

## 2020-12-04 NOTE — Chronic Care Management (AMB) (Signed)
  Chronic Care Management   Outreach Note  12/04/2020 Name: Hannah Duran MRN: [id number] DOB: 02/13/1934  Referred by: Tower, Wynelle Fanny, MD Reason for referral : No chief complaint on file.   An unsuccessful telephone outreach was attempted today. The patient was referred to the pharmacist for assistance with care management and care coordination.   Follow Up Plan:    Lauretta Grill Up[facility] Scheduler

## 2020-12-10 DIAGNOSIS — I872 Venous insufficiency (chronic) (peripheral): Secondary | ICD-10-CM | POA: Diagnosis not present

## 2020-12-10 DIAGNOSIS — R600 Localized edema: Secondary | ICD-10-CM | POA: Diagnosis not present

## 2020-12-10 DIAGNOSIS — L03115 Cellulitis of right lower limb: Secondary | ICD-10-CM | POA: Diagnosis not present

## 2020-12-10 DIAGNOSIS — F0390 Unspecified dementia without behavioral disturbance: Secondary | ICD-10-CM | POA: Diagnosis not present

## 2020-12-10 DIAGNOSIS — L03116 Cellulitis of left lower limb: Secondary | ICD-10-CM | POA: Diagnosis not present

## 2020-12-10 DIAGNOSIS — M6281 Muscle weakness (generalized): Secondary | ICD-10-CM | POA: Diagnosis not present

## 2020-12-18 ENCOUNTER — Telehealth: Payer: Self-pay | Admitting: Family Medicine

## 2020-12-18 NOTE — Progress Notes (Signed)
  Chronic Care Management   Outreach Note  12/18/2020 Name: Hannah Duran MRN: [id number] DOB: 04-22-1934  Referred by: Tower, Wynelle Fanny, MD Reason for referral : No chief complaint on file.   An unsuccessful telephone outreach was attempted today. The patient was referred to the pharmacist for assistance with care management and care coordination.   Follow Up Plan:   Lauretta Grill Upstream Scheduler

## 2020-12-26 DIAGNOSIS — E785 Hyperlipidemia, unspecified: Secondary | ICD-10-CM | POA: Diagnosis not present

## 2020-12-26 DIAGNOSIS — R600 Localized edema: Secondary | ICD-10-CM | POA: Diagnosis not present

## 2020-12-26 DIAGNOSIS — R5381 Other malaise: Secondary | ICD-10-CM | POA: Diagnosis not present

## 2020-12-26 DIAGNOSIS — E119 Type 2 diabetes mellitus without complications: Secondary | ICD-10-CM | POA: Diagnosis not present

## 2020-12-26 DIAGNOSIS — F0390 Unspecified dementia without behavioral disturbance: Secondary | ICD-10-CM | POA: Diagnosis not present

## 2020-12-26 DIAGNOSIS — I10 Essential (primary) hypertension: Secondary | ICD-10-CM | POA: Diagnosis not present

## 2020-12-26 DIAGNOSIS — B372 Candidiasis of skin and nail: Secondary | ICD-10-CM | POA: Diagnosis not present

## 2020-12-31 DIAGNOSIS — E1169 Type 2 diabetes mellitus with other specified complication: Secondary | ICD-10-CM | POA: Diagnosis not present

## 2020-12-31 DIAGNOSIS — L03116 Cellulitis of left lower limb: Secondary | ICD-10-CM | POA: Diagnosis not present

## 2020-12-31 DIAGNOSIS — E785 Hyperlipidemia, unspecified: Secondary | ICD-10-CM | POA: Diagnosis not present

## 2020-12-31 DIAGNOSIS — R600 Localized edema: Secondary | ICD-10-CM | POA: Diagnosis not present

## 2020-12-31 DIAGNOSIS — I10 Essential (primary) hypertension: Secondary | ICD-10-CM | POA: Diagnosis not present

## 2020-12-31 DIAGNOSIS — L03115 Cellulitis of right lower limb: Secondary | ICD-10-CM | POA: Diagnosis not present

## 2020-12-31 DIAGNOSIS — M6281 Muscle weakness (generalized): Secondary | ICD-10-CM | POA: Diagnosis not present

## 2020-12-31 DIAGNOSIS — I872 Venous insufficiency (chronic) (peripheral): Secondary | ICD-10-CM | POA: Diagnosis not present

## 2020-12-31 DIAGNOSIS — F0390 Unspecified dementia without behavioral disturbance: Secondary | ICD-10-CM | POA: Diagnosis not present

## 2020-12-31 DIAGNOSIS — Z7984 Long term (current) use of oral hypoglycemic drugs: Secondary | ICD-10-CM | POA: Diagnosis not present

## 2021-01-02 DIAGNOSIS — R600 Localized edema: Secondary | ICD-10-CM | POA: Diagnosis not present

## 2021-01-02 DIAGNOSIS — I10 Essential (primary) hypertension: Secondary | ICD-10-CM | POA: Diagnosis not present

## 2021-01-02 DIAGNOSIS — F0390 Unspecified dementia without behavioral disturbance: Secondary | ICD-10-CM | POA: Diagnosis not present

## 2021-01-02 DIAGNOSIS — B372 Candidiasis of skin and nail: Secondary | ICD-10-CM | POA: Diagnosis not present

## 2021-01-02 DIAGNOSIS — E785 Hyperlipidemia, unspecified: Secondary | ICD-10-CM | POA: Diagnosis not present

## 2021-01-02 DIAGNOSIS — E119 Type 2 diabetes mellitus without complications: Secondary | ICD-10-CM | POA: Diagnosis not present

## 2021-01-02 DIAGNOSIS — R5381 Other malaise: Secondary | ICD-10-CM | POA: Diagnosis not present

## 2021-01-07 DIAGNOSIS — E7849 Other hyperlipidemia: Secondary | ICD-10-CM | POA: Diagnosis not present

## 2021-01-07 DIAGNOSIS — D518 Other vitamin B12 deficiency anemias: Secondary | ICD-10-CM | POA: Diagnosis not present

## 2021-01-07 DIAGNOSIS — E119 Type 2 diabetes mellitus without complications: Secondary | ICD-10-CM | POA: Diagnosis not present

## 2021-01-07 DIAGNOSIS — Z79899 Other long term (current) drug therapy: Secondary | ICD-10-CM | POA: Diagnosis not present

## 2021-01-09 ENCOUNTER — Telehealth: Payer: Self-pay | Admitting: Family Medicine

## 2021-01-09 NOTE — Chronic Care Management (AMB) (Signed)
  Chronic Care Management   Outreach Note  01/09/2021 Name: Hannah Duran MRN: [id number] DOB: [birth year]  Referred by: Tower, Wynelle Fanny, MD Reason for referral : No chief complaint on file.   Third unsuccessful telephone outreach was attempted today. The patient was referred to the pharmacist for assistance with care management and care coordination.   Follow Up Plan:   Lauretta Grill Upstream Scheduler

## 2021-01-30 DIAGNOSIS — M6281 Muscle weakness (generalized): Secondary | ICD-10-CM | POA: Diagnosis not present

## 2021-01-30 DIAGNOSIS — I10 Essential (primary) hypertension: Secondary | ICD-10-CM | POA: Diagnosis not present

## 2021-01-30 DIAGNOSIS — Z7984 Long term (current) use of oral hypoglycemic drugs: Secondary | ICD-10-CM | POA: Diagnosis not present

## 2021-01-30 DIAGNOSIS — E1169 Type 2 diabetes mellitus with other specified complication: Secondary | ICD-10-CM | POA: Diagnosis not present

## 2021-01-30 DIAGNOSIS — E785 Hyperlipidemia, unspecified: Secondary | ICD-10-CM | POA: Diagnosis not present

## 2021-01-30 DIAGNOSIS — E538 Deficiency of other specified B group vitamins: Secondary | ICD-10-CM | POA: Diagnosis not present

## 2021-01-30 DIAGNOSIS — I872 Venous insufficiency (chronic) (peripheral): Secondary | ICD-10-CM | POA: Diagnosis not present

## 2021-01-30 DIAGNOSIS — F0390 Unspecified dementia without behavioral disturbance: Secondary | ICD-10-CM | POA: Diagnosis not present

## 2021-01-30 DIAGNOSIS — R600 Localized edema: Secondary | ICD-10-CM | POA: Diagnosis not present

## 2021-02-06 DIAGNOSIS — I10 Essential (primary) hypertension: Secondary | ICD-10-CM | POA: Diagnosis not present

## 2021-02-06 DIAGNOSIS — R600 Localized edema: Secondary | ICD-10-CM | POA: Diagnosis not present

## 2021-02-06 DIAGNOSIS — R5381 Other malaise: Secondary | ICD-10-CM | POA: Diagnosis not present

## 2021-02-06 DIAGNOSIS — B372 Candidiasis of skin and nail: Secondary | ICD-10-CM | POA: Diagnosis not present

## 2021-02-06 DIAGNOSIS — E785 Hyperlipidemia, unspecified: Secondary | ICD-10-CM | POA: Diagnosis not present

## 2021-02-06 DIAGNOSIS — E119 Type 2 diabetes mellitus without complications: Secondary | ICD-10-CM | POA: Diagnosis not present

## 2021-02-11 DIAGNOSIS — Z79899 Other long term (current) drug therapy: Secondary | ICD-10-CM | POA: Diagnosis not present

## 2021-02-11 DIAGNOSIS — E119 Type 2 diabetes mellitus without complications: Secondary | ICD-10-CM | POA: Diagnosis not present

## 2021-02-11 DIAGNOSIS — E7849 Other hyperlipidemia: Secondary | ICD-10-CM | POA: Diagnosis not present

## 2021-02-11 DIAGNOSIS — D518 Other vitamin B12 deficiency anemias: Secondary | ICD-10-CM | POA: Diagnosis not present

## 2021-02-13 DIAGNOSIS — E119 Type 2 diabetes mellitus without complications: Secondary | ICD-10-CM | POA: Diagnosis not present

## 2021-02-13 DIAGNOSIS — B372 Candidiasis of skin and nail: Secondary | ICD-10-CM | POA: Diagnosis not present

## 2021-02-13 DIAGNOSIS — E785 Hyperlipidemia, unspecified: Secondary | ICD-10-CM | POA: Diagnosis not present

## 2021-02-13 DIAGNOSIS — I10 Essential (primary) hypertension: Secondary | ICD-10-CM | POA: Diagnosis not present

## 2021-02-13 DIAGNOSIS — R600 Localized edema: Secondary | ICD-10-CM | POA: Diagnosis not present

## 2021-02-13 DIAGNOSIS — F0390 Unspecified dementia without behavioral disturbance: Secondary | ICD-10-CM | POA: Diagnosis not present

## 2021-02-25 DIAGNOSIS — E1169 Type 2 diabetes mellitus with other specified complication: Secondary | ICD-10-CM | POA: Diagnosis not present

## 2021-02-25 DIAGNOSIS — F0390 Unspecified dementia without behavioral disturbance: Secondary | ICD-10-CM | POA: Diagnosis not present

## 2021-02-25 DIAGNOSIS — M6281 Muscle weakness (generalized): Secondary | ICD-10-CM | POA: Diagnosis not present

## 2021-02-25 DIAGNOSIS — E538 Deficiency of other specified B group vitamins: Secondary | ICD-10-CM | POA: Diagnosis not present

## 2021-02-25 DIAGNOSIS — I872 Venous insufficiency (chronic) (peripheral): Secondary | ICD-10-CM | POA: Diagnosis not present

## 2021-02-25 DIAGNOSIS — R600 Localized edema: Secondary | ICD-10-CM | POA: Diagnosis not present

## 2021-02-26 DIAGNOSIS — E119 Type 2 diabetes mellitus without complications: Secondary | ICD-10-CM | POA: Diagnosis not present

## 2021-02-26 DIAGNOSIS — D518 Other vitamin B12 deficiency anemias: Secondary | ICD-10-CM | POA: Diagnosis not present

## 2021-02-26 DIAGNOSIS — I10 Essential (primary) hypertension: Secondary | ICD-10-CM | POA: Diagnosis not present

## 2021-02-26 DIAGNOSIS — E785 Hyperlipidemia, unspecified: Secondary | ICD-10-CM | POA: Diagnosis not present

## 2021-02-26 DIAGNOSIS — F0390 Unspecified dementia without behavioral disturbance: Secondary | ICD-10-CM | POA: Diagnosis not present

## 2021-02-26 DIAGNOSIS — E559 Vitamin D deficiency, unspecified: Secondary | ICD-10-CM | POA: Diagnosis not present

## 2021-02-26 DIAGNOSIS — E038 Other specified hypothyroidism: Secondary | ICD-10-CM | POA: Diagnosis not present

## 2021-02-27 DIAGNOSIS — E119 Type 2 diabetes mellitus without complications: Secondary | ICD-10-CM | POA: Diagnosis not present

## 2021-02-27 DIAGNOSIS — I10 Essential (primary) hypertension: Secondary | ICD-10-CM | POA: Diagnosis not present

## 2021-02-27 DIAGNOSIS — R600 Localized edema: Secondary | ICD-10-CM | POA: Diagnosis not present

## 2021-02-27 DIAGNOSIS — L0390 Cellulitis, unspecified: Secondary | ICD-10-CM | POA: Diagnosis not present

## 2021-02-27 DIAGNOSIS — E559 Vitamin D deficiency, unspecified: Secondary | ICD-10-CM | POA: Diagnosis not present

## 2021-02-27 DIAGNOSIS — E785 Hyperlipidemia, unspecified: Secondary | ICD-10-CM | POA: Diagnosis not present

## 2021-02-27 DIAGNOSIS — B372 Candidiasis of skin and nail: Secondary | ICD-10-CM | POA: Diagnosis not present

## 2021-02-27 DIAGNOSIS — F0390 Unspecified dementia without behavioral disturbance: Secondary | ICD-10-CM | POA: Diagnosis not present

## 2021-02-28 DIAGNOSIS — R600 Localized edema: Secondary | ICD-10-CM | POA: Diagnosis not present

## 2021-02-28 DIAGNOSIS — E785 Hyperlipidemia, unspecified: Secondary | ICD-10-CM | POA: Diagnosis not present

## 2021-02-28 DIAGNOSIS — E1169 Type 2 diabetes mellitus with other specified complication: Secondary | ICD-10-CM | POA: Diagnosis not present

## 2021-02-28 DIAGNOSIS — I872 Venous insufficiency (chronic) (peripheral): Secondary | ICD-10-CM | POA: Diagnosis not present

## 2021-02-28 DIAGNOSIS — F0390 Unspecified dementia without behavioral disturbance: Secondary | ICD-10-CM | POA: Diagnosis not present

## 2021-02-28 DIAGNOSIS — E538 Deficiency of other specified B group vitamins: Secondary | ICD-10-CM | POA: Diagnosis not present

## 2021-02-28 DIAGNOSIS — Z7984 Long term (current) use of oral hypoglycemic drugs: Secondary | ICD-10-CM | POA: Diagnosis not present

## 2021-02-28 DIAGNOSIS — I10 Essential (primary) hypertension: Secondary | ICD-10-CM | POA: Diagnosis not present

## 2021-02-28 DIAGNOSIS — M6281 Muscle weakness (generalized): Secondary | ICD-10-CM | POA: Diagnosis not present

## 2021-03-03 DIAGNOSIS — I10 Essential (primary) hypertension: Secondary | ICD-10-CM | POA: Diagnosis not present

## 2021-03-06 DIAGNOSIS — Z79899 Other long term (current) drug therapy: Secondary | ICD-10-CM | POA: Diagnosis not present

## 2021-03-06 DIAGNOSIS — E119 Type 2 diabetes mellitus without complications: Secondary | ICD-10-CM | POA: Diagnosis not present

## 2021-03-06 DIAGNOSIS — E7849 Other hyperlipidemia: Secondary | ICD-10-CM | POA: Diagnosis not present

## 2021-03-06 DIAGNOSIS — D518 Other vitamin B12 deficiency anemias: Secondary | ICD-10-CM | POA: Diagnosis not present

## 2021-03-07 DIAGNOSIS — E559 Vitamin D deficiency, unspecified: Secondary | ICD-10-CM | POA: Diagnosis not present

## 2021-03-07 DIAGNOSIS — F0390 Unspecified dementia without behavioral disturbance: Secondary | ICD-10-CM | POA: Diagnosis not present

## 2021-03-07 DIAGNOSIS — E119 Type 2 diabetes mellitus without complications: Secondary | ICD-10-CM | POA: Diagnosis not present

## 2021-03-07 DIAGNOSIS — E038 Other specified hypothyroidism: Secondary | ICD-10-CM | POA: Diagnosis not present

## 2021-03-07 DIAGNOSIS — D518 Other vitamin B12 deficiency anemias: Secondary | ICD-10-CM | POA: Diagnosis not present

## 2021-03-07 DIAGNOSIS — E785 Hyperlipidemia, unspecified: Secondary | ICD-10-CM | POA: Diagnosis not present

## 2021-03-07 DIAGNOSIS — I10 Essential (primary) hypertension: Secondary | ICD-10-CM | POA: Diagnosis not present

## 2021-03-13 DIAGNOSIS — I10 Essential (primary) hypertension: Secondary | ICD-10-CM | POA: Diagnosis not present

## 2021-03-13 DIAGNOSIS — E559 Vitamin D deficiency, unspecified: Secondary | ICD-10-CM | POA: Diagnosis not present

## 2021-03-13 DIAGNOSIS — F0390 Unspecified dementia without behavioral disturbance: Secondary | ICD-10-CM | POA: Diagnosis not present

## 2021-03-13 DIAGNOSIS — E119 Type 2 diabetes mellitus without complications: Secondary | ICD-10-CM | POA: Diagnosis not present

## 2021-03-13 DIAGNOSIS — B372 Candidiasis of skin and nail: Secondary | ICD-10-CM | POA: Diagnosis not present

## 2021-03-13 DIAGNOSIS — E785 Hyperlipidemia, unspecified: Secondary | ICD-10-CM | POA: Diagnosis not present

## 2021-03-13 DIAGNOSIS — R600 Localized edema: Secondary | ICD-10-CM | POA: Diagnosis not present

## 2021-03-13 DIAGNOSIS — L0390 Cellulitis, unspecified: Secondary | ICD-10-CM | POA: Diagnosis not present

## 2021-03-18 DIAGNOSIS — E7849 Other hyperlipidemia: Secondary | ICD-10-CM | POA: Diagnosis not present

## 2021-03-18 DIAGNOSIS — Z79899 Other long term (current) drug therapy: Secondary | ICD-10-CM | POA: Diagnosis not present

## 2021-03-18 DIAGNOSIS — D518 Other vitamin B12 deficiency anemias: Secondary | ICD-10-CM | POA: Diagnosis not present

## 2021-03-18 DIAGNOSIS — E119 Type 2 diabetes mellitus without complications: Secondary | ICD-10-CM | POA: Diagnosis not present

## 2021-03-20 DIAGNOSIS — E559 Vitamin D deficiency, unspecified: Secondary | ICD-10-CM | POA: Diagnosis not present

## 2021-03-20 DIAGNOSIS — R600 Localized edema: Secondary | ICD-10-CM | POA: Diagnosis not present

## 2021-03-20 DIAGNOSIS — E119 Type 2 diabetes mellitus without complications: Secondary | ICD-10-CM | POA: Diagnosis not present

## 2021-03-20 DIAGNOSIS — B372 Candidiasis of skin and nail: Secondary | ICD-10-CM | POA: Diagnosis not present

## 2021-03-20 DIAGNOSIS — E785 Hyperlipidemia, unspecified: Secondary | ICD-10-CM | POA: Diagnosis not present

## 2021-03-20 DIAGNOSIS — I10 Essential (primary) hypertension: Secondary | ICD-10-CM | POA: Diagnosis not present

## 2021-03-25 DIAGNOSIS — D518 Other vitamin B12 deficiency anemias: Secondary | ICD-10-CM | POA: Diagnosis not present

## 2021-03-25 DIAGNOSIS — E7849 Other hyperlipidemia: Secondary | ICD-10-CM | POA: Diagnosis not present

## 2021-03-25 DIAGNOSIS — Z79899 Other long term (current) drug therapy: Secondary | ICD-10-CM | POA: Diagnosis not present

## 2021-03-25 DIAGNOSIS — E119 Type 2 diabetes mellitus without complications: Secondary | ICD-10-CM | POA: Diagnosis not present

## 2021-03-28 ENCOUNTER — Emergency Department
Admission: EM | Admit: 2021-03-28 | Discharge: 2021-03-28 | Disposition: A | Discharge status: Home / Self Care | Payer: PPO | Attending: Emergency Medicine | Admitting: Emergency Medicine

## 2021-03-28 ENCOUNTER — Encounter: Payer: Self-pay | Admitting: Radiology

## 2021-03-28 ENCOUNTER — Other Ambulatory Visit: Payer: Self-pay

## 2021-03-28 ENCOUNTER — Emergency Department: Payer: PPO

## 2021-03-28 DIAGNOSIS — F0390 Unspecified dementia without behavioral disturbance: Secondary | ICD-10-CM | POA: Insufficient documentation

## 2021-03-28 DIAGNOSIS — N3000 Acute cystitis without hematuria: Secondary | ICD-10-CM

## 2021-03-28 DIAGNOSIS — I10 Essential (primary) hypertension: Secondary | ICD-10-CM | POA: Insufficient documentation

## 2021-03-28 DIAGNOSIS — Y92002 Bathroom of unspecified non-institutional (private) residence single-family (private) house as the place of occurrence of the external cause: Secondary | ICD-10-CM | POA: Insufficient documentation

## 2021-03-28 DIAGNOSIS — W010XXA Fall on same level from slipping, tripping and stumbling without subsequent striking against object, initial encounter: Secondary | ICD-10-CM | POA: Insufficient documentation

## 2021-03-28 DIAGNOSIS — Z79899 Other long term (current) drug therapy: Secondary | ICD-10-CM | POA: Insufficient documentation

## 2021-03-28 DIAGNOSIS — E119 Type 2 diabetes mellitus without complications: Secondary | ICD-10-CM | POA: Insufficient documentation

## 2021-03-28 DIAGNOSIS — I517 Cardiomegaly: Secondary | ICD-10-CM | POA: Diagnosis not present

## 2021-03-28 DIAGNOSIS — Z20822 Contact with and (suspected) exposure to covid-19: Secondary | ICD-10-CM | POA: Insufficient documentation

## 2021-03-28 DIAGNOSIS — M47812 Spondylosis without myelopathy or radiculopathy, cervical region: Secondary | ICD-10-CM | POA: Diagnosis not present

## 2021-03-28 DIAGNOSIS — R404 Transient alteration of awareness: Secondary | ICD-10-CM | POA: Diagnosis not present

## 2021-03-28 DIAGNOSIS — S0990XA Unspecified injury of head, initial encounter: Secondary | ICD-10-CM | POA: Diagnosis present

## 2021-03-28 DIAGNOSIS — M5030 Other cervical disc degeneration, unspecified cervical region: Secondary | ICD-10-CM | POA: Diagnosis not present

## 2021-03-28 DIAGNOSIS — R52 Pain, unspecified: Secondary | ICD-10-CM | POA: Diagnosis not present

## 2021-03-28 DIAGNOSIS — S0081XA Abrasion of other part of head, initial encounter: Secondary | ICD-10-CM | POA: Insufficient documentation

## 2021-03-28 DIAGNOSIS — Z7984 Long term (current) use of oral hypoglycemic drugs: Secondary | ICD-10-CM | POA: Diagnosis not present

## 2021-03-28 DIAGNOSIS — W19XXXA Unspecified fall, initial encounter: Secondary | ICD-10-CM

## 2021-03-28 DIAGNOSIS — M4312 Spondylolisthesis, cervical region: Secondary | ICD-10-CM | POA: Diagnosis not present

## 2021-03-28 DIAGNOSIS — M4802 Spinal stenosis, cervical region: Secondary | ICD-10-CM | POA: Diagnosis not present

## 2021-03-28 DIAGNOSIS — Z043 Encounter for examination and observation following other accident: Secondary | ICD-10-CM | POA: Diagnosis not present

## 2021-03-28 DIAGNOSIS — M25552 Pain in left hip: Secondary | ICD-10-CM | POA: Diagnosis not present

## 2021-03-28 DIAGNOSIS — Z7401 Bed confinement status: Secondary | ICD-10-CM | POA: Diagnosis not present

## 2021-03-28 LAB — URINALYSIS, COMPLETE (UACMP) WITH MICROSCOPIC
Bilirubin Urine: NEGATIVE
Glucose, UA: NEGATIVE mg/dL
Hgb urine dipstick: NEGATIVE
Ketones, ur: NEGATIVE mg/dL
Nitrite: POSITIVE — AB
Protein, ur: NEGATIVE mg/dL
Specific Gravity, Urine: 1.014 (ref 1.005–1.030)
pH: 9 — ABNORMAL HIGH (ref 5.0–8.0)

## 2021-03-28 LAB — RESP PANEL BY RT-PCR (FLU A&B, COVID) ARPGX2
Influenza A by PCR: NEGATIVE
Influenza B by PCR: NEGATIVE
SARS Coronavirus 2 by RT PCR: NEGATIVE

## 2021-03-28 MED ORDER — FOSFOMYCIN TROMETHAMINE 3 G PO PACK: 3.0000 g | PACK | Freq: Once | ORAL | 0 refills | Status: AC

## 2021-03-28 MED ORDER — FOSFOMYCIN TROMETHAMINE 3 G PO PACK
3.0000 g | PACK | Freq: Once | ORAL | Status: AC
  Administered 2021-03-28: 3 g via ORAL
  Filled 2021-03-28: qty 3

## 2021-03-28 MED ORDER — NITROFURANTOIN MONOHYD MACRO 100 MG PO CAPS: 100.0000 mg | ORAL_CAPSULE | Freq: Once | ORAL | Status: DC

## 2021-03-28 NOTE — ED Notes (Signed)
Pt being transported back to [facility] via [employer].

## 2021-03-28 NOTE — ED Notes (Signed)
When asked what the patients birthday is she states that its [birth year]. This is incorrect. It has been verified by her son that her correct birthday is what we have on her chart 2034/05/14. CT and Xray notified also so there was no confusion when asking her.

## 2021-03-28 NOTE — ED Notes (Signed)
Called [facility] for transport to [place]

## 2021-03-28 NOTE — Discharge Instructions (Addendum)
Take the second fosfomycin dose on 03/31/21

## 2021-03-28 NOTE — ED Provider Notes (Signed)
[facility] Emergency Department Provider Note  ____________________________________________   Event Date/Time   First MD Initiated Contact with Patient 03/28/21 0813     (approximate)  I have reviewed the triage vital signs and the nursing notes.   HISTORY  Chief Complaint No chief complaint on file.    HPI Hannah Duran is a 87 y.o. female  with h/o HTN, HLD, ischemic colitis, here with fall. History limited 2/2 dementia. Per report, pt slipped while in her room today. There was no obvious trauma at the scene. Pt denies any complaints on my assessment. Was able to ambulate to the stretcher per EMS.  Level 5 caveat invoked as remainder of history, ROS, and physical exam limited due to patient's dementia  Past Medical History:  Diagnosis Date   Anxiety    Congenital foot deformity    Dermatophytosis    Hallucinations    HLD (hyperlipidemia)    HTN (hypertension)    Hyperglycemia    Irregular heart rhythm    pt unsure of what type; followed by PCP only   Ischemic colitis ([place]) 7/13   on colonoscopy   Memory loss    Obesity    Rotator cuff syndrome     Patient Active Problem List   Diagnosis Date Noted   Estrogen deficiency 09/19/2020   Hearing loss 09/19/2020   Leg weakness, bilateral 03/21/2020   Hypokalemia 03/21/2020   Dementia ([place]) 01/18/2020   Vaginal discharge 12/11/2019   Poor balance 06/29/2019   Vitamin B12 deficiency 03/29/2019   Visual hallucinations 01/05/2019   History of recurrent UTIs 01/05/2019   Encounter for screening mammogram for breast cancer 05/28/2017   Routine general medical examination at a health care facility 05/19/2016   Ankle edema 05/01/2015   Venous (peripheral) insufficiency 05/01/2015   Encounter for Medicare annual wellness exam 06/02/2013   Internal hemorrhoid 06/21/2012   Morbid obesity ([place])    HTN (hypertension)    Hyperlipidemia associated with type 2 diabetes mellitus ([place])    Diabetes  type 2, controlled ([place])     Past Surgical History:  Procedure Laterality Date   CATARACT EXTRACTION W/PHACO Right 09/09/2016   Procedure: CATARACT EXTRACTION PHACO AND INTRAOCULAR LENS PLACEMENT ([place]);  Surgeon: Leandrew Koyanagi, MD;  Location: [place];  Service: Ophthalmology;  Laterality: Right;  right   CATARACT EXTRACTION W/PHACO Left 12/16/2016   Procedure: CATARACT EXTRACTION PHACO AND INTRAOCULAR LENS PLACEMENT ([place])  Left;  Surgeon: Leandrew Koyanagi, MD;  Location: [place];  Service: Ophthalmology;  Laterality: Left;   HUMERUS FRACTURE SURGERY  10/1997   TOTAL ABDOMINAL HYSTERECTOMY  1980's   due to menometrorrhagia    Prior to Admission medications   Medication Sig Start Date End Date Taking? Authorizing Provider  fosfomycin (MONUROL) 3 g PACK Take 3 g by mouth once for 1 dose. 03/31/21 03/31/21 Yes Duffy Bruce, MD  albuterol (VENTOLIN HFA) 108 (90 Base) MCG/ACT inhaler Inhale 2 puffs into the lungs every 4 (four) hours as needed for wheezing. 04/29/20   Tower, Wynelle Fanny, MD  atorvastatin (LIPITOR) 20 MG tablet Take 1 tablet (20 mg total) by mouth daily. 09/19/20   Tower, Wynelle Fanny, MD  furosemide (LASIX) 20 MG tablet Take 1 tablet (20 mg total) by mouth daily. 10/24/20   Tower, Wynelle Fanny, MD  losartan (COZAAR) 25 MG tablet Take 1 tablet (25 mg total) by mouth daily. 09/19/20   Tower, Wynelle Fanny, MD  metFORMIN (GLUCOPHAGE) 500 MG tablet Take 1 tablet (500  mg total) by mouth 2 (two) times daily with a meal. 09/19/20   Tower, Wynelle Fanny, MD  potassium chloride (KLOR-CON) 10 MEQ tablet Take 1 tablet (10 mEq total) by mouth daily. Take one pill by mouth once weekly when you take your furosemide 09/19/20   Tower, Wynelle Fanny, MD  QUEtiapine (SEROQUEL) 25 MG tablet Take 1 tablet (25 mg total) by mouth at bedtime. 01/18/20   Suzzanne Cloud, NP    Allergies Penicillins, Sulfamethoxazole-trimethoprim, and Amoxicillin  Family History  Problem Relation Age of Onset   Stroke Mother     Parkinsonism Mother    Hypertension Mother    Alzheimer's disease Mother    Cancer Father        bladder/prostate   Breast cancer Neg Hx     Social History Social History   Tobacco Use   Smoking status: Never   Smokeless tobacco: Never  Vaping Use   Vaping Use: Never used  Substance Use Topics   Alcohol use: No   Drug use: No    Review of Systems  Review of Systems  Unable to perform ROS: Dementia    ____________________________________________  PHYSICAL EXAM:      VITAL SIGNS: ED Triage Vitals  Enc Vitals Group     BP      Pulse      Resp      Temp      Temp src      SpO2      Weight      Height      Head Circumference      Peak Flow      Pain Score      Pain Loc      Pain Edu?      Excl. in [place]?      Physical Exam Vitals and nursing note reviewed.  Constitutional:      General: She is not in acute distress.    Appearance: She is well-developed.  HENT:     Head: Normocephalic and atraumatic.     Comments: Superficial abrasion L forehead. No open wounds. Eyes:     Conjunctiva/sclera: Conjunctivae normal.  Cardiovascular:     Rate and Rhythm: Normal rate and regular rhythm.     Heart sounds: Normal heart sounds.  Pulmonary:     Effort: Pulmonary effort is normal. No respiratory distress.     Breath sounds: No wheezing.  Abdominal:     General: There is no distension.  Musculoskeletal:     Cervical back: Neck supple.  Skin:    General: Skin is warm.     Capillary Refill: Capillary refill takes less than 2 seconds.     Findings: No rash.  Neurological:     Mental Status: She is alert. Mental status is at baseline. She is disoriented.     Motor: No abnormal muscle tone.      ____________________________________________   LABS (all labs ordered are listed, but only abnormal results are displayed)  Labs Reviewed  URINALYSIS, COMPLETE (UACMP) WITH MICROSCOPIC - Abnormal; Notable for the following components:      Result Value   Color,  Urine YELLOW (*)    APPearance HAZY (*)    pH 9.0 (*)    Nitrite POSITIVE (*)    Leukocytes,Ua LARGE (*)    Bacteria, UA FEW (*)    All other components within normal limits  RESP PANEL BY RT-PCR (FLU A&B, COVID) [id number]  URINE CULTURE    ____________________________________________  EKG:  ________________________________________  RADIOLOGY All imaging, including plain films, CT scans, and ultrasounds, independently reviewed by me, and interpretations confirmed via formal radiology reads.  ED MD interpretation:   CT Head: NAICA CT C Spine: Negative CXR: Neg PXR: Neg  Official radiology report(s): DG Chest 2 View  Result Date: 03/28/2021 CLINICAL DATA:  87 year old female status post fall in bathroom this morning. EXAM: CHEST - 2 VIEW COMPARISON:  Chest radiograph 02/27/2020 and earlier. FINDINGS: Seated AP and lateral views of the chest. Lung volumes and mediastinal contours are stable since 2020. Borderline cardiomegaly. Visualized tracheal air column is within normal limits. No pneumothorax, pulmonary edema, pleural effusion or confluent pulmonary opacity. Stable visualized osseous structures. Negative visible bowel gas pattern. IMPRESSION: No acute cardiopulmonary abnormality or acute traumatic injury identified. Electronically Signed   By: Genevie Ann M.D.   On: 03/28/2021 09:41   DG Pelvis 1-2 Views  Result Date: 03/28/2021 CLINICAL DATA:  87 year old female status post fall in bathroom this morning. EXAM: PELVIS - 1-2 VIEW COMPARISON:  CT Abdomen and Pelvis 01/22/2012. FINDINGS: Chronic degeneration at the pubic symphysis. The pelvis appears stable and intact. Bone mineralization is within normal limits for age. Femoral heads remain normally located. Grossly intact proximal femurs. SI joints appear stable and symmetric. Partially visible lumbar spine degeneration. Negative lower abdominal and pelvic visceral contours. IMPRESSION: No acute fracture or dislocation identified about the  pelvis. Electronically Signed   By: Genevie Ann M.D.   On: 03/28/2021 09:42   CT Head Wo Contrast  Result Date: 03/28/2021 CLINICAL DATA:  87 year old female status post fall in bathroom this morning. EXAM: CT HEAD WITHOUT CONTRAST TECHNIQUE: Contiguous axial images were obtained from the base of the skull through the vertex without intravenous contrast. COMPARISON:  Brain MRI 03/16/2019.  Head CT 06/18/2019 FINDINGS: Brain: Small right frontal convexity partially calcified meningioma measures about 11 mm (series 2, image 19) and has not significantly changed since the 2020 MRI. No regional mass effect or edema. Stable cerebral volume since that time. No midline shift, ventriculomegaly, mass effect, intracranial hemorrhage or evidence of cortically based acute infarction. Gray-white matter differentiation within normal limits for age throughout the brain. Vascular: Calcified atherosclerosis at the skull base. No suspicious intracranial vascular hyperdensity. Skull: Stable, intact. Sinuses/Orbits: Visualized paranasal sinuses and mastoids are stable and well aerated. Other: No orbit or scalp soft tissue injury identified. IMPRESSION: 1. No acute traumatic injury identified. 2. Stable and largely negative for age noncontrast CT appearance of the brain. Small chronic small right frontal meningioma. Electronically Signed   By: Genevie Ann M.D.   On: 03/28/2021 09:13   CT Cervical Spine Wo Contrast  Result Date: 03/28/2021 CLINICAL DATA:  87 year old female status post fall in bathroom this morning. EXAM: CT CERVICAL SPINE WITHOUT CONTRAST TECHNIQUE: Multidetector CT imaging of the cervical spine was performed without intravenous contrast. Multiplanar CT image reconstructions were also generated. COMPARISON:  Head CT today.  Cervical spine CT 06/18/2019. FINDINGS: Alignment: Stable. Straightening of cervical lordosis with mild chronic anterolisthesis at C3-C4 and C7-T1. Bilateral posterior element alignment is within  normal limits. Skull base and vertebrae: Osteopenia. Visualized skull base is intact. No atlanto-occipital dissociation. C1 and C2 appear intact and aligned. No acute osseous abnormality identified. Soft tissues and spinal canal: No prevertebral fluid or swelling. No visible canal hematoma. Stable and negative noncontrast visible neck soft tissues with a partially retropharyngeal course of the carotids. Disc levels: Advanced chronic cervical spine degeneration appears stable since 2020. Up to mild associated  degenerative cervical spinal stenosis. Upper chest: Visible upper thoracic levels appears stable and intact. Clear lung apices. IMPRESSION: 1. No acute traumatic injury identified in the cervical spine. 2. Osteopenia. Stable chronic cervical spine degeneration since 2020. Electronically Signed   By: Genevie Ann M.D.   On: 03/28/2021 09:16    ____________________________________________  PROCEDURES   Procedure(s) performed (including Critical Care):  Procedures  ____________________________________________  INITIAL IMPRESSION / MDM / [place] / ED COURSE  As part of my medical decision making, I reviewed the following data within the [place] notes reviewed and incorporated, Old chart reviewed, Notes from prior ED visits, and [place] Controlled Substance Database       *Shandrea Ahlman was evaluated in Emergency Department on 03/28/2021 for the symptoms described in the history of present illness. She was evaluated in the context of the global COVID-19 pandemic, which necessitated consideration that the patient might be at risk for infection with the SARS-CoV-2 virus that causes COVID-19. Institutional protocols and algorithms that pertain to the evaluation of patients at risk for COVID-19 are in a state of rapid change based on information released by regulatory bodies including the CDC and federal and state organizations. These policies and algorithms were  followed during the patient's care in the ED.  Some ED evaluations and interventions may be delayed as a result of limited staffing during the pandemic.*     Medical Decision Making:  87 yo F here with fall. Likely mechanical. Discussed w/ son. CT imaging is negative. Pt is otherwise HDS and at her mental baseline. She does have h/o UTIs so UA sent. Will give fosfomycin x 2 for cUTI due to allergies, d/c with outt follow-up.  ____________________________________________  FINAL CLINICAL IMPRESSION(S) / ED DIAGNOSES  Final diagnoses:  Fall, initial encounter  Acute cystitis without hematuria     MEDICATIONS GIVEN DURING THIS VISIT:  Medications  fosfomycin (MONUROL) packet 3 g (3 g Oral Given 03/28/21 1055)     ED Discharge Orders          Ordered    fosfomycin (MONUROL) 3 g PACK   Once        03/28/21 1157             Note:  This document was prepared using Dragon voice recognition software and may include unintentional dictation errors.   Duffy Bruce, MD 03/28/21 1204

## 2021-03-28 NOTE — ED Notes (Signed)
Informed [place] at [facility] that pt was returning shortly.

## 2021-03-28 NOTE — ED Notes (Signed)
Pt in via EMS with complaints of a mechanical fall this morning. Pt states that she went to the bathroom with her rolling walker and slipped in the bathroom floor. EMS states that the floor was very slippery and pt is wearing TED hose. Pt with complaint with EMS of left hip and leg pain but currently has no complaints. Pt is alert to self, place, and situation but unable to recall month date or year. Son has been contacted.

## 2021-03-31 DIAGNOSIS — Z7984 Long term (current) use of oral hypoglycemic drugs: Secondary | ICD-10-CM | POA: Diagnosis not present

## 2021-03-31 DIAGNOSIS — R600 Localized edema: Secondary | ICD-10-CM | POA: Diagnosis not present

## 2021-03-31 DIAGNOSIS — I872 Venous insufficiency (chronic) (peripheral): Secondary | ICD-10-CM | POA: Diagnosis not present

## 2021-03-31 DIAGNOSIS — E1169 Type 2 diabetes mellitus with other specified complication: Secondary | ICD-10-CM | POA: Diagnosis not present

## 2021-03-31 DIAGNOSIS — I10 Essential (primary) hypertension: Secondary | ICD-10-CM | POA: Diagnosis not present

## 2021-03-31 DIAGNOSIS — F0390 Unspecified dementia without behavioral disturbance: Secondary | ICD-10-CM | POA: Diagnosis not present

## 2021-03-31 DIAGNOSIS — E785 Hyperlipidemia, unspecified: Secondary | ICD-10-CM | POA: Diagnosis not present

## 2021-03-31 DIAGNOSIS — R2689 Other abnormalities of gait and mobility: Secondary | ICD-10-CM | POA: Diagnosis not present

## 2021-03-31 DIAGNOSIS — E538 Deficiency of other specified B group vitamins: Secondary | ICD-10-CM | POA: Diagnosis not present

## 2021-03-31 DIAGNOSIS — M6281 Muscle weakness (generalized): Secondary | ICD-10-CM | POA: Diagnosis not present

## 2021-03-31 LAB — URINE CULTURE: Culture: >=100000 — AB

## 2021-04-01 DIAGNOSIS — D518 Other vitamin B12 deficiency anemias: Secondary | ICD-10-CM | POA: Diagnosis not present

## 2021-04-01 DIAGNOSIS — E7849 Other hyperlipidemia: Secondary | ICD-10-CM | POA: Diagnosis not present

## 2021-04-01 DIAGNOSIS — Z79899 Other long term (current) drug therapy: Secondary | ICD-10-CM | POA: Diagnosis not present

## 2021-04-01 DIAGNOSIS — E559 Vitamin D deficiency, unspecified: Secondary | ICD-10-CM | POA: Diagnosis not present

## 2021-04-01 DIAGNOSIS — E038 Other specified hypothyroidism: Secondary | ICD-10-CM | POA: Diagnosis not present

## 2021-04-01 DIAGNOSIS — E119 Type 2 diabetes mellitus without complications: Secondary | ICD-10-CM | POA: Diagnosis not present

## 2021-04-03 DIAGNOSIS — B372 Candidiasis of skin and nail: Secondary | ICD-10-CM | POA: Diagnosis not present

## 2021-04-03 DIAGNOSIS — E038 Other specified hypothyroidism: Secondary | ICD-10-CM | POA: Diagnosis not present

## 2021-04-03 DIAGNOSIS — E785 Hyperlipidemia, unspecified: Secondary | ICD-10-CM | POA: Diagnosis not present

## 2021-04-03 DIAGNOSIS — K5900 Constipation, unspecified: Secondary | ICD-10-CM | POA: Diagnosis not present

## 2021-04-03 DIAGNOSIS — I10 Essential (primary) hypertension: Secondary | ICD-10-CM | POA: Diagnosis not present

## 2021-04-03 DIAGNOSIS — R600 Localized edema: Secondary | ICD-10-CM | POA: Diagnosis not present

## 2021-04-03 DIAGNOSIS — E119 Type 2 diabetes mellitus without complications: Secondary | ICD-10-CM | POA: Diagnosis not present

## 2021-04-03 DIAGNOSIS — I872 Venous insufficiency (chronic) (peripheral): Secondary | ICD-10-CM | POA: Diagnosis not present

## 2021-04-03 DIAGNOSIS — N390 Urinary tract infection, site not specified: Secondary | ICD-10-CM | POA: Diagnosis not present

## 2021-04-14 DIAGNOSIS — R600 Localized edema: Secondary | ICD-10-CM | POA: Diagnosis not present

## 2021-04-14 DIAGNOSIS — F0390 Unspecified dementia without behavioral disturbance: Secondary | ICD-10-CM | POA: Diagnosis not present

## 2021-04-14 DIAGNOSIS — Z7984 Long term (current) use of oral hypoglycemic drugs: Secondary | ICD-10-CM | POA: Diagnosis not present

## 2021-04-14 DIAGNOSIS — M6281 Muscle weakness (generalized): Secondary | ICD-10-CM | POA: Diagnosis not present

## 2021-04-14 DIAGNOSIS — I872 Venous insufficiency (chronic) (peripheral): Secondary | ICD-10-CM | POA: Diagnosis not present

## 2021-04-14 DIAGNOSIS — E1169 Type 2 diabetes mellitus with other specified complication: Secondary | ICD-10-CM | POA: Diagnosis not present

## 2021-04-14 DIAGNOSIS — E538 Deficiency of other specified B group vitamins: Secondary | ICD-10-CM | POA: Diagnosis not present

## 2021-04-28 DIAGNOSIS — E038 Other specified hypothyroidism: Secondary | ICD-10-CM | POA: Diagnosis not present

## 2021-04-28 DIAGNOSIS — I10 Essential (primary) hypertension: Secondary | ICD-10-CM | POA: Diagnosis not present

## 2021-04-28 DIAGNOSIS — E785 Hyperlipidemia, unspecified: Secondary | ICD-10-CM | POA: Diagnosis not present

## 2021-04-28 DIAGNOSIS — E559 Vitamin D deficiency, unspecified: Secondary | ICD-10-CM | POA: Diagnosis not present

## 2021-04-28 DIAGNOSIS — F0390 Unspecified dementia without behavioral disturbance: Secondary | ICD-10-CM | POA: Diagnosis not present

## 2021-04-28 DIAGNOSIS — D518 Other vitamin B12 deficiency anemias: Secondary | ICD-10-CM | POA: Diagnosis not present

## 2021-04-28 DIAGNOSIS — E119 Type 2 diabetes mellitus without complications: Secondary | ICD-10-CM | POA: Diagnosis not present

## 2021-05-01 DIAGNOSIS — B372 Candidiasis of skin and nail: Secondary | ICD-10-CM | POA: Diagnosis not present

## 2021-05-01 DIAGNOSIS — E559 Vitamin D deficiency, unspecified: Secondary | ICD-10-CM | POA: Diagnosis not present

## 2021-05-01 DIAGNOSIS — E038 Other specified hypothyroidism: Secondary | ICD-10-CM | POA: Diagnosis not present

## 2021-05-01 DIAGNOSIS — I10 Essential (primary) hypertension: Secondary | ICD-10-CM | POA: Diagnosis not present

## 2021-05-01 DIAGNOSIS — E119 Type 2 diabetes mellitus without complications: Secondary | ICD-10-CM | POA: Diagnosis not present

## 2021-05-01 DIAGNOSIS — E785 Hyperlipidemia, unspecified: Secondary | ICD-10-CM | POA: Diagnosis not present

## 2021-05-01 DIAGNOSIS — R600 Localized edema: Secondary | ICD-10-CM | POA: Diagnosis not present

## 2021-05-01 DIAGNOSIS — K5900 Constipation, unspecified: Secondary | ICD-10-CM | POA: Diagnosis not present

## 2021-05-02 DIAGNOSIS — E119 Type 2 diabetes mellitus without complications: Secondary | ICD-10-CM | POA: Diagnosis not present

## 2021-05-02 DIAGNOSIS — F0390 Unspecified dementia without behavioral disturbance: Secondary | ICD-10-CM | POA: Diagnosis not present

## 2021-05-02 DIAGNOSIS — I10 Essential (primary) hypertension: Secondary | ICD-10-CM | POA: Diagnosis not present

## 2021-05-02 DIAGNOSIS — E785 Hyperlipidemia, unspecified: Secondary | ICD-10-CM | POA: Diagnosis not present

## 2021-05-02 DIAGNOSIS — E559 Vitamin D deficiency, unspecified: Secondary | ICD-10-CM | POA: Diagnosis not present

## 2021-05-02 DIAGNOSIS — E038 Other specified hypothyroidism: Secondary | ICD-10-CM | POA: Diagnosis not present

## 2021-05-06 DIAGNOSIS — I10 Essential (primary) hypertension: Secondary | ICD-10-CM | POA: Diagnosis not present

## 2021-05-06 DIAGNOSIS — E1169 Type 2 diabetes mellitus with other specified complication: Secondary | ICD-10-CM | POA: Diagnosis not present

## 2021-05-06 DIAGNOSIS — E538 Deficiency of other specified B group vitamins: Secondary | ICD-10-CM | POA: Diagnosis not present

## 2021-05-06 DIAGNOSIS — R600 Localized edema: Secondary | ICD-10-CM | POA: Diagnosis not present

## 2021-05-06 DIAGNOSIS — Z7984 Long term (current) use of oral hypoglycemic drugs: Secondary | ICD-10-CM | POA: Diagnosis not present

## 2021-05-06 DIAGNOSIS — M6281 Muscle weakness (generalized): Secondary | ICD-10-CM | POA: Diagnosis not present

## 2021-05-06 DIAGNOSIS — F0390 Unspecified dementia without behavioral disturbance: Secondary | ICD-10-CM | POA: Diagnosis not present

## 2021-05-06 DIAGNOSIS — R2689 Other abnormalities of gait and mobility: Secondary | ICD-10-CM | POA: Diagnosis not present

## 2021-05-06 DIAGNOSIS — I872 Venous insufficiency (chronic) (peripheral): Secondary | ICD-10-CM | POA: Diagnosis not present

## 2021-05-06 DIAGNOSIS — E785 Hyperlipidemia, unspecified: Secondary | ICD-10-CM | POA: Diagnosis not present

## 2021-05-12 DIAGNOSIS — Z79899 Other long term (current) drug therapy: Secondary | ICD-10-CM | POA: Diagnosis not present

## 2021-05-12 DIAGNOSIS — E038 Other specified hypothyroidism: Secondary | ICD-10-CM | POA: Diagnosis not present

## 2021-05-29 DIAGNOSIS — I872 Venous insufficiency (chronic) (peripheral): Secondary | ICD-10-CM | POA: Diagnosis not present

## 2021-05-29 DIAGNOSIS — E559 Vitamin D deficiency, unspecified: Secondary | ICD-10-CM | POA: Diagnosis not present

## 2021-05-29 DIAGNOSIS — E785 Hyperlipidemia, unspecified: Secondary | ICD-10-CM | POA: Diagnosis not present

## 2021-05-29 DIAGNOSIS — R600 Localized edema: Secondary | ICD-10-CM | POA: Diagnosis not present

## 2021-05-29 DIAGNOSIS — I10 Essential (primary) hypertension: Secondary | ICD-10-CM | POA: Diagnosis not present

## 2021-05-29 DIAGNOSIS — E119 Type 2 diabetes mellitus without complications: Secondary | ICD-10-CM | POA: Diagnosis not present

## 2021-05-29 DIAGNOSIS — K5900 Constipation, unspecified: Secondary | ICD-10-CM | POA: Diagnosis not present

## 2021-05-29 DIAGNOSIS — E038 Other specified hypothyroidism: Secondary | ICD-10-CM | POA: Diagnosis not present

## 2021-05-29 DIAGNOSIS — B372 Candidiasis of skin and nail: Secondary | ICD-10-CM | POA: Diagnosis not present

## 2021-06-02 DIAGNOSIS — E785 Hyperlipidemia, unspecified: Secondary | ICD-10-CM | POA: Diagnosis not present

## 2021-06-02 DIAGNOSIS — E7849 Other hyperlipidemia: Secondary | ICD-10-CM | POA: Diagnosis not present

## 2021-06-02 DIAGNOSIS — I10 Essential (primary) hypertension: Secondary | ICD-10-CM | POA: Diagnosis not present

## 2021-06-02 DIAGNOSIS — D518 Other vitamin B12 deficiency anemias: Secondary | ICD-10-CM | POA: Diagnosis not present

## 2021-06-02 DIAGNOSIS — Z79899 Other long term (current) drug therapy: Secondary | ICD-10-CM | POA: Diagnosis not present

## 2021-06-02 DIAGNOSIS — E119 Type 2 diabetes mellitus without complications: Secondary | ICD-10-CM | POA: Diagnosis not present

## 2021-06-02 DIAGNOSIS — E038 Other specified hypothyroidism: Secondary | ICD-10-CM | POA: Diagnosis not present

## 2021-06-02 DIAGNOSIS — E559 Vitamin D deficiency, unspecified: Secondary | ICD-10-CM | POA: Diagnosis not present

## 2021-06-03 DIAGNOSIS — F0390 Unspecified dementia without behavioral disturbance: Secondary | ICD-10-CM | POA: Diagnosis not present

## 2021-06-03 DIAGNOSIS — E559 Vitamin D deficiency, unspecified: Secondary | ICD-10-CM | POA: Diagnosis not present

## 2021-06-03 DIAGNOSIS — E119 Type 2 diabetes mellitus without complications: Secondary | ICD-10-CM | POA: Diagnosis not present

## 2021-06-03 DIAGNOSIS — I10 Essential (primary) hypertension: Secondary | ICD-10-CM | POA: Diagnosis not present

## 2021-06-03 DIAGNOSIS — E038 Other specified hypothyroidism: Secondary | ICD-10-CM | POA: Diagnosis not present

## 2021-06-03 DIAGNOSIS — E785 Hyperlipidemia, unspecified: Secondary | ICD-10-CM | POA: Diagnosis not present

## 2021-06-04 DIAGNOSIS — E1159 Type 2 diabetes mellitus with other circulatory complications: Secondary | ICD-10-CM | POA: Diagnosis not present

## 2021-06-04 DIAGNOSIS — R600 Localized edema: Secondary | ICD-10-CM | POA: Diagnosis not present

## 2021-06-04 DIAGNOSIS — L603 Nail dystrophy: Secondary | ICD-10-CM | POA: Diagnosis not present

## 2021-06-25 DIAGNOSIS — I10 Essential (primary) hypertension: Secondary | ICD-10-CM | POA: Diagnosis not present

## 2021-06-26 DIAGNOSIS — I10 Essential (primary) hypertension: Secondary | ICD-10-CM | POA: Diagnosis not present

## 2021-06-26 DIAGNOSIS — B372 Candidiasis of skin and nail: Secondary | ICD-10-CM | POA: Diagnosis not present

## 2021-06-26 DIAGNOSIS — I872 Venous insufficiency (chronic) (peripheral): Secondary | ICD-10-CM | POA: Diagnosis not present

## 2021-06-26 DIAGNOSIS — E038 Other specified hypothyroidism: Secondary | ICD-10-CM | POA: Diagnosis not present

## 2021-06-26 DIAGNOSIS — E785 Hyperlipidemia, unspecified: Secondary | ICD-10-CM | POA: Diagnosis not present

## 2021-06-26 DIAGNOSIS — R600 Localized edema: Secondary | ICD-10-CM | POA: Diagnosis not present

## 2021-06-26 DIAGNOSIS — K5900 Constipation, unspecified: Secondary | ICD-10-CM | POA: Diagnosis not present

## 2021-06-26 DIAGNOSIS — E119 Type 2 diabetes mellitus without complications: Secondary | ICD-10-CM | POA: Diagnosis not present

## 2021-06-26 DIAGNOSIS — E559 Vitamin D deficiency, unspecified: Secondary | ICD-10-CM | POA: Diagnosis not present

## 2021-07-02 DIAGNOSIS — E559 Vitamin D deficiency, unspecified: Secondary | ICD-10-CM | POA: Diagnosis not present

## 2021-07-02 DIAGNOSIS — I10 Essential (primary) hypertension: Secondary | ICD-10-CM | POA: Diagnosis not present

## 2021-07-02 DIAGNOSIS — E119 Type 2 diabetes mellitus without complications: Secondary | ICD-10-CM | POA: Diagnosis not present

## 2021-07-02 DIAGNOSIS — E038 Other specified hypothyroidism: Secondary | ICD-10-CM | POA: Diagnosis not present

## 2021-07-02 DIAGNOSIS — F0390 Unspecified dementia without behavioral disturbance: Secondary | ICD-10-CM | POA: Diagnosis not present

## 2021-07-02 DIAGNOSIS — E785 Hyperlipidemia, unspecified: Secondary | ICD-10-CM | POA: Diagnosis not present

## 2021-07-16 DIAGNOSIS — I890 Lymphedema, not elsewhere classified: Secondary | ICD-10-CM | POA: Insufficient documentation

## 2021-07-16 NOTE — Progress Notes (Signed)
MRN : [id number]  Hannah Duran is a 87 y.o. ([birth year]) female who presents with chief complaint of leg swelling.  History of Present Illness:   Patient is seen for evaluation of leg swelling. The patient first noticed the swelling remotely but is now concerned because of a significant increase in the overall edema. The swelling is associated with pain and discoloration. The patient notes that in the morning the legs are significantly improved but they steadily worsened throughout the course of the day. Elevation makes the legs better, dependency makes them much worse.   There is no history of ulcerations associated with the swelling.   The patient denies any recent changes in their medications.  The patient has not been wearing graduated compression.  The patient has no had any past angiography, interventions or vascular surgery.  The patient denies a history of DVT or PE. There is no prior history of phlebitis. There is no history of primary lymphedema.  There is no history of radiation treatment to the groin or pelvis No history of malignancies. No history of trauma or groin or pelvic surgery. No history of foreign travel or parasitic infections area    No outpatient medications have been marked as taking for the 07/17/21 encounter (Appointment) with Delana Meyer, Dolores Lory, MD.    Past Medical History:  Diagnosis Date   Anxiety    Congenital foot deformity    Dermatophytosis    Hallucinations    HLD (hyperlipidemia)    HTN (hypertension)    Hyperglycemia    Irregular heart rhythm    pt unsure of what type; followed by PCP only   Ischemic colitis ([place]) 7/13   on colonoscopy   Memory loss    Obesity    Rotator cuff syndrome     Past Surgical History:  Procedure Laterality Date   CATARACT EXTRACTION W/PHACO Right 09/09/2016   Procedure: CATARACT EXTRACTION PHACO AND INTRAOCULAR LENS PLACEMENT ([place]);  Surgeon: Leandrew Koyanagi, MD;  Location: [place];   Service: Ophthalmology;  Laterality: Right;  right   CATARACT EXTRACTION W/PHACO Left 12/16/2016   Procedure: CATARACT EXTRACTION PHACO AND INTRAOCULAR LENS PLACEMENT ([place])  Left;  Surgeon: Leandrew Koyanagi, MD;  Location: [place];  Service: Ophthalmology;  Laterality: Left;   HUMERUS FRACTURE SURGERY  10/1997   TOTAL ABDOMINAL HYSTERECTOMY  1980's   due to menometrorrhagia    Social History Social History   Tobacco Use   Smoking status: Never   Smokeless tobacco: Never  Vaping Use   Vaping Use: Never used  Substance Use Topics   Alcohol use: No   Drug use: No    Family History Family History  Problem Relation Age of Onset   Stroke Mother    Parkinsonism Mother    Hypertension Mother    Alzheimer's disease Mother    Cancer Father        bladder/prostate   Breast cancer Neg Hx     Allergies  Allergen Reactions   Penicillins     REACTION: Rash   Sulfamethoxazole-Trimethoprim     rash   Amoxicillin Rash     REVIEW OF SYSTEMS (Negative unless checked)  Constitutional: [] Weight loss  [] Fever  [] Chills Cardiac: [] Chest pain   [] Chest pressure   [] Palpitations   [] Shortness of breath when laying flat   [] Shortness of breath with exertion. Vascular:  [] Pain in legs with walking   [x] Pain in legs at rest  [] History of DVT   [] Phlebitis   [x] Swelling  in legs   [] Varicose veins   [] Non-healing ulcers Pulmonary:   [] Uses home oxygen   [] Productive cough   [] Hemoptysis   [] Wheeze  [] COPD   [] Asthma Neurologic:  [] Dizziness   [] Seizures   [] History of stroke   [] History of TIA  [] Aphasia   [] Vissual changes   [] Weakness or numbness in arm   [] Weakness or numbness in leg Musculoskeletal:   [] Joint swelling   [] Joint pain   [] Low back pain Hematologic:  [] Easy bruising  [] Easy bleeding   [] Hypercoagulable state   [] Anemic Gastrointestinal:  [] Diarrhea   [] Vomiting  [] Gastroesophageal reflux/heartburn   [] Difficulty swallowing. Genitourinary:  [] Chronic kidney  disease   [] Difficult urination  [] Frequent urination   [] Blood in urine Skin:  [] Rashes   [] Ulcers  Psychological:  [] History of anxiety   []  History of major depression.  Physical Examination  There were no vitals filed for this visit. There is no height or weight on file to calculate BMI. Gen: WD/WN, NAD Head: [place]/AT, No temporalis wasting.  Ear/Nose/Throat: Hearing grossly intact, nares w/o erythema or drainage, pinna without lesions Eyes: PER, EOMI, sclera nonicteric.  Neck: Supple, no gross masses.  No JVD.  Pulmonary:  Good air movement, no audible wheezing, no use of accessory muscles.  Cardiac: RRR, precordium not hyperdynamic. Vascular:  scattered varicosities present bilaterally.  Moderate venous stasis changes to the legs bilaterally.  3-4+ soft pitting edema  Vessel Right Left  Radial Palpable Palpable  Gastrointestinal: soft, non-distended. No guarding/no peritoneal signs.  Musculoskeletal: M/S 5/5 throughout.  No deformity.  Neurologic: CN 2-12 intact. Pain and light touch intact in extremities.  Symmetrical.  Speech is fluent. Motor exam as listed above. Psychiatric: Judgment intact, Mood & affect appropriate for pt's clinical situation. Dermatologic: Venous rashes no ulcers noted.  No changes consistent with cellulitis. Lymph : No lichenification or skin changes of chronic lymphedema.  CBC Lab Results  Component Value Date   WBC 7.8 09/12/2020   HGB 11.8 (L) 09/12/2020   HCT 36.0 09/12/2020   MCV 88.7 09/12/2020   PLT 276.0 09/12/2020    BMET    Component Value Date/Time   NA 141 09/12/2020 1052   NA 138 01/20/2012 1104   K 4.1 09/12/2020 1052   K 3.8 01/20/2012 1104   CL 105 09/12/2020 1052   CL 103 01/20/2012 1104   CO2 30 09/12/2020 1052   CO2 29 01/20/2012 1104   GLUCOSE 106 (H) 09/12/2020 1052   GLUCOSE 116 (H) 01/20/2012 1104   BUN 20 09/12/2020 1052   BUN 12 01/22/2012 1133   CREATININE 0.86 09/12/2020 1052   CREATININE 0.77 01/22/2012 1133    CALCIUM 9.1 09/12/2020 1052   CALCIUM 8.8 01/20/2012 1104   GFRNONAA 60 (L) 02/27/2020 1956   GFRNONAA >60 01/22/2012 1133   GFRAA >60 02/27/2020 1956   GFRAA >60 01/22/2012 1133   CrCl cannot be calculated (Patient's most recent lab result is older than the maximum 21 days allowed.).  COAG Lab Results  Component Value Date   INR 1.0 01/20/2012    Radiology No results found.   Assessment/Plan 1. Lymphedema Recommend:  No surgery or intervention at this point in time.    I have reviewed my previous discussion with the patient regarding swelling and why it causes symptoms.  Patient will continue wearing graduated compression stockings class 1 (20-30 mmHg) on a daily basis. The patient will  beginning wearing the stockings first thing in the morning and removing them in the evening.  The patient is instructed specifically not to sleep in the stockings.    In addition, behavioral modification including several periods of elevation of the lower extremities during the day will be continued.  This was reviewed with the patient during the initial visit.  The patient will also continue routine exercise, especially walking on a daily basis as was discussed during the initial visit.    Despite conservative treatments including graduated compression therapy class 1 and behavioral modification including exercise and elevation the patient  has not obtained adequate control of the lymphedema.  The patient still has stage 3 lymphedema and therefore, I believe that a lymph pump should be added to improve the control of the patient's lymphedema.  Additionally, a lymph pump is warranted because it will reduce the risk of cellulitis and ulceration in the future.  Patient should follow-up in six months   - VAS [place] LOWER EXTREMITY VENOUS REFLUX; Future  2. Venous (peripheral) insufficiency Recommend:  No surgery or intervention at this point in time.    I have reviewed my previous discussion with  the patient regarding swelling and why it causes symptoms.  Patient will continue wearing graduated compression stockings class 1 (20-30 mmHg) on a daily basis. The patient will  beginning wearing the stockings first thing in the morning and removing them in the evening. The patient is instructed specifically not to sleep in the stockings.    In addition, behavioral modification including several periods of elevation of the lower extremities during the day will be continued.  This was reviewed with the patient during the initial visit.  The patient will also continue routine exercise, especially walking on a daily basis as was discussed during the initial visit.    Despite conservative treatments including graduated compression therapy class 1 and behavioral modification including exercise and elevation the patient  has not obtained adequate control of the lymphedema.  The patient still has stage 3 lymphedema and therefore, I believe that a lymph pump should be added to improve the control of the patient's lymphedema.  Additionally, a lymph pump is warranted because it will reduce the risk of cellulitis and ulceration in the future.  Patient should follow-up in six months   - VAS [place] LOWER EXTREMITY VENOUS REFLUX; Future  3. Primary hypertension Continue antihypertensive medications as already ordered, these medications have been reviewed and there are no changes at this time.   4. Controlled type 2 diabetes mellitus without complication, without long-term current use of insulin ([place]) Continue hypoglycemic medications as already ordered, these medications have been reviewed and there are no changes at this time.  Hgb A1C to be monitored as already arranged by primary service   5. Hyperlipidemia associated with type 2 diabetes mellitus ([place]) Continue statin as ordered and reviewed, no changes at this time     Hortencia Pilar, MD  07/16/2021 8:25 AM

## 2021-07-17 ENCOUNTER — Other Ambulatory Visit: Payer: Self-pay

## 2021-07-17 ENCOUNTER — Ambulatory Visit (INDEPENDENT_AMBULATORY_CARE_PROVIDER_SITE_OTHER): Payer: PPO | Admitting: Vascular Surgery

## 2021-07-17 VITALS — BP 139/70 | HR 76 | Ht 63.0 in | Wt 224.0 lb

## 2021-07-17 DIAGNOSIS — D518 Other vitamin B12 deficiency anemias: Secondary | ICD-10-CM | POA: Insufficient documentation

## 2021-07-17 DIAGNOSIS — E039 Hypothyroidism, unspecified: Secondary | ICD-10-CM | POA: Insufficient documentation

## 2021-07-17 DIAGNOSIS — E785 Hyperlipidemia, unspecified: Secondary | ICD-10-CM

## 2021-07-17 DIAGNOSIS — I872 Venous insufficiency (chronic) (peripheral): Secondary | ICD-10-CM

## 2021-07-17 DIAGNOSIS — I890 Lymphedema, not elsewhere classified: Secondary | ICD-10-CM | POA: Diagnosis not present

## 2021-07-17 DIAGNOSIS — E119 Type 2 diabetes mellitus without complications: Secondary | ICD-10-CM | POA: Diagnosis not present

## 2021-07-17 DIAGNOSIS — E1169 Type 2 diabetes mellitus with other specified complication: Secondary | ICD-10-CM

## 2021-07-17 DIAGNOSIS — I10 Essential (primary) hypertension: Secondary | ICD-10-CM

## 2021-07-17 DIAGNOSIS — E559 Vitamin D deficiency, unspecified: Secondary | ICD-10-CM | POA: Insufficient documentation

## 2021-07-17 DIAGNOSIS — K5900 Constipation, unspecified: Secondary | ICD-10-CM | POA: Insufficient documentation

## 2021-07-20 ENCOUNTER — Encounter (INDEPENDENT_AMBULATORY_CARE_PROVIDER_SITE_OTHER): Payer: Self-pay | Admitting: Vascular Surgery

## 2021-07-23 DIAGNOSIS — E559 Vitamin D deficiency, unspecified: Secondary | ICD-10-CM | POA: Diagnosis not present

## 2021-07-23 DIAGNOSIS — E119 Type 2 diabetes mellitus without complications: Secondary | ICD-10-CM | POA: Diagnosis not present

## 2021-07-23 DIAGNOSIS — E785 Hyperlipidemia, unspecified: Secondary | ICD-10-CM | POA: Diagnosis not present

## 2021-07-23 DIAGNOSIS — K5900 Constipation, unspecified: Secondary | ICD-10-CM | POA: Diagnosis not present

## 2021-07-23 DIAGNOSIS — B372 Candidiasis of skin and nail: Secondary | ICD-10-CM | POA: Diagnosis not present

## 2021-07-23 DIAGNOSIS — R600 Localized edema: Secondary | ICD-10-CM | POA: Diagnosis not present

## 2021-07-23 DIAGNOSIS — I10 Essential (primary) hypertension: Secondary | ICD-10-CM | POA: Diagnosis not present

## 2021-07-28 DIAGNOSIS — I10 Essential (primary) hypertension: Secondary | ICD-10-CM | POA: Diagnosis not present

## 2021-07-31 ENCOUNTER — Ambulatory Visit (INDEPENDENT_AMBULATORY_CARE_PROVIDER_SITE_OTHER): Payer: PPO

## 2021-07-31 ENCOUNTER — Other Ambulatory Visit: Payer: Self-pay

## 2021-07-31 ENCOUNTER — Ambulatory Visit (INDEPENDENT_AMBULATORY_CARE_PROVIDER_SITE_OTHER): Payer: PPO | Admitting: Nurse Practitioner

## 2021-07-31 VITALS — BP 138/78 | HR 71 | Ht 67.0 in | Wt 230.0 lb

## 2021-07-31 DIAGNOSIS — I10 Essential (primary) hypertension: Secondary | ICD-10-CM | POA: Diagnosis not present

## 2021-07-31 DIAGNOSIS — I890 Lymphedema, not elsewhere classified: Secondary | ICD-10-CM

## 2021-07-31 DIAGNOSIS — E1169 Type 2 diabetes mellitus with other specified complication: Secondary | ICD-10-CM | POA: Diagnosis not present

## 2021-07-31 DIAGNOSIS — I872 Venous insufficiency (chronic) (peripheral): Secondary | ICD-10-CM

## 2021-07-31 DIAGNOSIS — E119 Type 2 diabetes mellitus without complications: Secondary | ICD-10-CM | POA: Diagnosis not present

## 2021-07-31 DIAGNOSIS — E785 Hyperlipidemia, unspecified: Secondary | ICD-10-CM | POA: Diagnosis not present

## 2021-08-02 NOTE — Progress Notes (Signed)
Chart reviewed, agree above plan ?

## 2021-08-06 DIAGNOSIS — E119 Type 2 diabetes mellitus without complications: Secondary | ICD-10-CM | POA: Diagnosis not present

## 2021-08-06 DIAGNOSIS — E559 Vitamin D deficiency, unspecified: Secondary | ICD-10-CM | POA: Diagnosis not present

## 2021-08-06 DIAGNOSIS — E785 Hyperlipidemia, unspecified: Secondary | ICD-10-CM | POA: Diagnosis not present

## 2021-08-06 DIAGNOSIS — F0390 Unspecified dementia without behavioral disturbance: Secondary | ICD-10-CM | POA: Diagnosis not present

## 2021-08-06 DIAGNOSIS — I10 Essential (primary) hypertension: Secondary | ICD-10-CM | POA: Diagnosis not present

## 2021-08-06 DIAGNOSIS — E038 Other specified hypothyroidism: Secondary | ICD-10-CM | POA: Diagnosis not present

## 2021-08-06 DIAGNOSIS — D518 Other vitamin B12 deficiency anemias: Secondary | ICD-10-CM | POA: Diagnosis not present

## 2021-08-11 ENCOUNTER — Encounter (INDEPENDENT_AMBULATORY_CARE_PROVIDER_SITE_OTHER): Payer: Self-pay | Admitting: Nurse Practitioner

## 2021-08-11 NOTE — Progress Notes (Signed)
Subjective:    Patient ID: Hannah Duran, female    DOB: April 17, 1934, 87 y.o.   MRN: [id number] Chief Complaint  Patient presents with   Follow-up    Pt conv Bil reflux     The patient returns to the office for followup evaluation regarding leg swelling.  The swelling has persisted and the pain associated with swelling continues. There have not been any interval development of a ulcerations or wounds.  Since the previous visit the patient has been wearing graduated compression stockings and has noted little if any improvement in the lymphedema. The patient has been using compression routinely morning until night.  The patient also states elevation during the day and exercise is being done too.    Today noninvasive studies show no evidence of DVT or superficial thrombophlebitis bilaterally.  No evidence of deep venous insufficiency or superficial venous reflux bilaterally.   Review of Systems  Cardiovascular:  Positive for leg swelling.      Objective:   Physical Exam Vitals reviewed.  HENT:     Head: Normocephalic.  Cardiovascular:     Rate and Rhythm: Normal rate.     Pulses: Normal pulses.  Pulmonary:     Effort: Pulmonary effort is normal.  Musculoskeletal:     Right lower leg: 2+ Edema present.     Left lower leg: 2+ Edema present.  Skin:    Comments: Dermal thickening bilaterally  Neurological:     Mental Status: She is alert and oriented to person, place, and time.  Psychiatric:        Mood and Affect: Mood normal.        Behavior: Behavior normal.        Thought Content: Thought content normal.        Judgment: Judgment normal.    BP 138/78   Pulse 71   Ht 5\' 7"  (1.702 m)   Wt 230 lb (104.3 kg)   LMP 08/31/1980   BMI 36.02 kg/m   Past Medical History:  Diagnosis Date   Anxiety    Congenital foot deformity    Dermatophytosis    Hallucinations    HLD (hyperlipidemia)    HTN (hypertension)    Hyperglycemia    Irregular heart rhythm    pt unsure  of what type; followed by PCP only   Ischemic colitis ([place]) 7/13   on colonoscopy   Memory loss    Obesity    Rotator cuff syndrome     Social History   Socioeconomic History   Marital status: Widowed    Spouse name: Not on file   Number of children: 2   Years of education: 12   Highest education level: High school graduate  Occupational History   Occupation: retired Network engineer  Tobacco Use   Smoking status: Never   Smokeless tobacco: Never  Vaping Use   Vaping Use: Never used  Substance and Sexual Activity   Alcohol use: No   Drug use: No   Sexual activity: Never  Other Topics Concern   Not on file  Social History Narrative   Lives at home alone.   Right-handed.   One cup coffee per day.   Social Determinants of Health   Financial Resource Strain: Not on file  Food Insecurity: Not on file  Transportation Needs: Not on file  Physical Activity: Not on file  Stress: Not on file  Social Connections: Not on file  Intimate Partner Violence: Not on file    Past Surgical  History:  Procedure Laterality Date   CATARACT EXTRACTION W/PHACO Right 09/09/2016   Procedure: CATARACT EXTRACTION PHACO AND INTRAOCULAR LENS PLACEMENT (IOC);  Surgeon: Leandrew Koyanagi, MD;  Location: [place];  Service: Ophthalmology;  Laterality: Right;  right   CATARACT EXTRACTION W/PHACO Left 12/16/2016   Procedure: CATARACT EXTRACTION PHACO AND INTRAOCULAR LENS PLACEMENT ([place])  Left;  Surgeon: Leandrew Koyanagi, MD;  Location: [place];  Service: Ophthalmology;  Laterality: Left;   HUMERUS FRACTURE SURGERY  10/1997   TOTAL ABDOMINAL HYSTERECTOMY  1980's   due to menometrorrhagia    Family History  Problem Relation Age of Onset   Stroke Mother    Parkinsonism Mother    Hypertension Mother    Alzheimer's disease Mother    Cancer Father        bladder/prostate   Breast cancer Neg Hx     Allergies  Allergen Reactions   Penicillins     REACTION: Rash    Sulfamethoxazole-Trimethoprim     rash   Amoxicillin Rash    CBC Latest Ref Rng & Units 09/12/2020 02/27/2020 06/18/2019  WBC 4.0 - 10.5 K/uL 7.8 9.7 9.2  Hemoglobin 12.0 - 15.0 g/dL 11.8(L) 11.2(L) 11.4(L)  Hematocrit 36.0 - 46.0 % 36.0 34.4(L) 36.1  Platelets 150.0 - 400.0 K/uL 276.0 284 319      CMP     Component Value Date/Time   NA 141 09/12/2020 1052   NA 138 01/20/2012 1104   K 4.1 09/12/2020 1052   K 3.8 01/20/2012 1104   CL 105 09/12/2020 1052   CL 103 01/20/2012 1104   CO2 30 09/12/2020 1052   CO2 29 01/20/2012 1104   GLUCOSE 106 (H) 09/12/2020 1052   GLUCOSE 116 (H) 01/20/2012 1104   BUN 20 09/12/2020 1052   BUN 12 01/22/2012 1133   CREATININE 0.86 09/12/2020 1052   CREATININE 0.77 01/22/2012 1133   CALCIUM 9.1 09/12/2020 1052   CALCIUM 8.8 01/20/2012 1104   PROT 6.1 09/12/2020 1052   PROT 7.1 01/20/2012 1104   ALBUMIN 4.1 09/12/2020 1052   ALBUMIN 3.5 01/20/2012 1104   AST 14 09/12/2020 1052   AST 30 01/20/2012 1104   ALT 10 09/12/2020 1052   ALT 27 01/20/2012 1104   ALKPHOS 89 09/12/2020 1052   ALKPHOS 84 01/20/2012 1104   BILITOT 0.7 09/12/2020 1052   BILITOT 1.1 (H) 01/20/2012 1104   GFRNONAA 60 (L) 02/27/2020 1956   GFRNONAA >60 01/22/2012 1133   GFRAA >60 02/27/2020 1956   GFRAA >60 01/22/2012 1133     No results found.     Assessment & Plan:   1. Lymphedema Recommend:  No surgery or intervention at this point in time.    I have reviewed my previous discussion with the patient regarding swelling and why it causes symptoms.  Patient will continue wearing graduated compression stockings class 1 (20-30 mmHg) on a daily basis. The patient will  beginning wearing the stockings first thing in the morning and removing them in the evening. The patient is instructed specifically not to sleep in the stockings.    In addition, behavioral modification including several periods of elevation of the lower extremities during the day will be continued.   This was reviewed with the patient during the initial visit.  The patient will also continue routine exercise, especially walking on a daily basis as was discussed during the initial visit.    Despite conservative treatments of at least four weeks including graduated compression therapy class 1 and  behavioral modification including exercise and elevation the patient  has not obtained adequate control of the lymphedema.  The patient still has stage 3 lymphedema and therefore, I believe that a lymph pump should be added to improve the control of the patient's lymphedema.  Additionally, a lymph pump is warranted because it will reduce the risk of cellulitis and ulceration in the future.  Patient should follow-up in six months    2. Primary hypertension Continue antihypertensive medications as already ordered, these medications have been reviewed and there are no changes at this time.   3. Controlled type 2 diabetes mellitus without complication, without long-term current use of insulin ([place]) Continue hypoglycemic medications as already ordered, these medications have been reviewed and there are no changes at this time.  Hgb A1C to be monitored as already arranged by primary service   4. Hyperlipidemia associated with type 2 diabetes mellitus ([place]) Continue statin as ordered and reviewed, no changes at this time    Current Outpatient Medications on File Prior to Visit  Medication Sig Dispense Refill   albuterol (VENTOLIN HFA) 108 (90 Base) MCG/ACT inhaler Inhale 2 puffs into the lungs every 4 (four) hours as needed for wheezing. 1 each 1   atorvastatin (LIPITOR) 20 MG tablet Take 1 tablet (20 mg total) by mouth daily. 90 tablet 3   furosemide (LASIX) 20 MG tablet Take 1 tablet (20 mg total) by mouth daily. 90 tablet 3   losartan (COZAAR) 25 MG tablet Take 1 tablet (25 mg total) by mouth daily. 90 tablet 3   metFORMIN (GLUCOPHAGE) 500 MG tablet Take 1 tablet (500 mg total) by mouth 2 (two) times  daily with a meal. 180 tablet 3   NYSTATIN powder SMARTSIG:1 Application Topical 2-3 Times Daily     potassium chloride (KLOR-CON) 10 MEQ tablet Take 1 tablet (10 mEq total) by mouth daily. Take one pill by mouth once weekly when you take your furosemide 90 tablet 3   QUEtiapine (SEROQUEL) 25 MG tablet Take 1 tablet (25 mg total) by mouth at bedtime. 30 tablet 11   No current facility-administered medications on file prior to visit.    There are no Patient Instructions on file for this visit. No follow-ups on file.   Kris Hartmann, NP

## 2021-08-21 DIAGNOSIS — K5900 Constipation, unspecified: Secondary | ICD-10-CM | POA: Diagnosis not present

## 2021-08-21 DIAGNOSIS — I872 Venous insufficiency (chronic) (peripheral): Secondary | ICD-10-CM | POA: Diagnosis not present

## 2021-08-21 DIAGNOSIS — E119 Type 2 diabetes mellitus without complications: Secondary | ICD-10-CM | POA: Diagnosis not present

## 2021-08-21 DIAGNOSIS — F0390 Unspecified dementia without behavioral disturbance: Secondary | ICD-10-CM | POA: Diagnosis not present

## 2021-08-21 DIAGNOSIS — E559 Vitamin D deficiency, unspecified: Secondary | ICD-10-CM | POA: Diagnosis not present

## 2021-08-21 DIAGNOSIS — B372 Candidiasis of skin and nail: Secondary | ICD-10-CM | POA: Diagnosis not present

## 2021-08-21 DIAGNOSIS — I10 Essential (primary) hypertension: Secondary | ICD-10-CM | POA: Diagnosis not present

## 2021-08-21 DIAGNOSIS — R600 Localized edema: Secondary | ICD-10-CM | POA: Diagnosis not present

## 2021-08-21 DIAGNOSIS — E785 Hyperlipidemia, unspecified: Secondary | ICD-10-CM | POA: Diagnosis not present

## 2021-08-21 DIAGNOSIS — I890 Lymphedema, not elsewhere classified: Secondary | ICD-10-CM | POA: Diagnosis not present

## 2021-08-26 DIAGNOSIS — I10 Essential (primary) hypertension: Secondary | ICD-10-CM | POA: Diagnosis not present

## 2021-08-27 DIAGNOSIS — M6281 Muscle weakness (generalized): Secondary | ICD-10-CM | POA: Diagnosis not present

## 2021-08-27 DIAGNOSIS — I10 Essential (primary) hypertension: Secondary | ICD-10-CM | POA: Diagnosis not present

## 2021-08-27 DIAGNOSIS — E755 Other lipid storage disorders: Secondary | ICD-10-CM | POA: Diagnosis not present

## 2021-08-27 DIAGNOSIS — E119 Type 2 diabetes mellitus without complications: Secondary | ICD-10-CM | POA: Diagnosis not present

## 2021-08-27 DIAGNOSIS — I872 Venous insufficiency (chronic) (peripheral): Secondary | ICD-10-CM | POA: Diagnosis not present

## 2021-08-27 DIAGNOSIS — R600 Localized edema: Secondary | ICD-10-CM | POA: Diagnosis not present

## 2021-08-27 DIAGNOSIS — E559 Vitamin D deficiency, unspecified: Secondary | ICD-10-CM | POA: Diagnosis not present

## 2021-08-27 DIAGNOSIS — F0390 Unspecified dementia without behavioral disturbance: Secondary | ICD-10-CM | POA: Diagnosis not present

## 2021-08-27 DIAGNOSIS — B372 Candidiasis of skin and nail: Secondary | ICD-10-CM | POA: Diagnosis not present

## 2021-09-03 DIAGNOSIS — E755 Other lipid storage disorders: Secondary | ICD-10-CM | POA: Diagnosis not present

## 2021-09-03 DIAGNOSIS — B372 Candidiasis of skin and nail: Secondary | ICD-10-CM | POA: Diagnosis not present

## 2021-09-03 DIAGNOSIS — R600 Localized edema: Secondary | ICD-10-CM | POA: Diagnosis not present

## 2021-09-03 DIAGNOSIS — E119 Type 2 diabetes mellitus without complications: Secondary | ICD-10-CM | POA: Diagnosis not present

## 2021-09-03 DIAGNOSIS — F0390 Unspecified dementia without behavioral disturbance: Secondary | ICD-10-CM | POA: Diagnosis not present

## 2021-09-03 DIAGNOSIS — E559 Vitamin D deficiency, unspecified: Secondary | ICD-10-CM | POA: Diagnosis not present

## 2021-09-03 DIAGNOSIS — M6281 Muscle weakness (generalized): Secondary | ICD-10-CM | POA: Diagnosis not present

## 2021-09-03 DIAGNOSIS — I872 Venous insufficiency (chronic) (peripheral): Secondary | ICD-10-CM | POA: Diagnosis not present

## 2021-09-03 DIAGNOSIS — I10 Essential (primary) hypertension: Secondary | ICD-10-CM | POA: Diagnosis not present

## 2021-09-04 DIAGNOSIS — E119 Type 2 diabetes mellitus without complications: Secondary | ICD-10-CM | POA: Diagnosis not present

## 2021-09-04 DIAGNOSIS — F0390 Unspecified dementia without behavioral disturbance: Secondary | ICD-10-CM | POA: Diagnosis not present

## 2021-09-04 DIAGNOSIS — I10 Essential (primary) hypertension: Secondary | ICD-10-CM | POA: Diagnosis not present

## 2021-09-04 DIAGNOSIS — E559 Vitamin D deficiency, unspecified: Secondary | ICD-10-CM | POA: Diagnosis not present

## 2021-09-04 DIAGNOSIS — R1310 Dysphagia, unspecified: Secondary | ICD-10-CM | POA: Diagnosis not present

## 2021-09-04 DIAGNOSIS — K5900 Constipation, unspecified: Secondary | ICD-10-CM | POA: Diagnosis not present

## 2021-09-04 DIAGNOSIS — R600 Localized edema: Secondary | ICD-10-CM | POA: Diagnosis not present

## 2021-09-04 DIAGNOSIS — B372 Candidiasis of skin and nail: Secondary | ICD-10-CM | POA: Diagnosis not present

## 2021-09-04 DIAGNOSIS — E785 Hyperlipidemia, unspecified: Secondary | ICD-10-CM | POA: Diagnosis not present

## 2021-09-04 DIAGNOSIS — I890 Lymphedema, not elsewhere classified: Secondary | ICD-10-CM | POA: Diagnosis not present

## 2021-09-17 DIAGNOSIS — I10 Essential (primary) hypertension: Secondary | ICD-10-CM | POA: Diagnosis not present

## 2021-09-17 DIAGNOSIS — E038 Other specified hypothyroidism: Secondary | ICD-10-CM | POA: Diagnosis not present

## 2021-09-17 DIAGNOSIS — F0390 Unspecified dementia without behavioral disturbance: Secondary | ICD-10-CM | POA: Diagnosis not present

## 2021-09-17 DIAGNOSIS — E785 Hyperlipidemia, unspecified: Secondary | ICD-10-CM | POA: Diagnosis not present

## 2021-09-17 DIAGNOSIS — E119 Type 2 diabetes mellitus without complications: Secondary | ICD-10-CM | POA: Diagnosis not present

## 2021-09-17 DIAGNOSIS — E559 Vitamin D deficiency, unspecified: Secondary | ICD-10-CM | POA: Diagnosis not present

## 2021-09-22 ENCOUNTER — Telehealth (INDEPENDENT_AMBULATORY_CARE_PROVIDER_SITE_OTHER): Payer: Self-pay

## 2021-09-22 NOTE — Telephone Encounter (Signed)
Hannah Duran from [facility] called and left a VM for the pt asking about the pt's Lymph pumps. I called Vaughan Basta  about the Pt and left a VM for her with with the name and number for [employer] for any information she may need about the pt's lymph pumps.

## 2021-09-23 DIAGNOSIS — I890 Lymphedema, not elsewhere classified: Secondary | ICD-10-CM | POA: Diagnosis not present

## 2021-09-24 DIAGNOSIS — I10 Essential (primary) hypertension: Secondary | ICD-10-CM | POA: Diagnosis not present

## 2021-09-25 DIAGNOSIS — I10 Essential (primary) hypertension: Secondary | ICD-10-CM | POA: Diagnosis not present

## 2021-09-25 DIAGNOSIS — R600 Localized edema: Secondary | ICD-10-CM | POA: Diagnosis not present

## 2021-09-25 DIAGNOSIS — E119 Type 2 diabetes mellitus without complications: Secondary | ICD-10-CM | POA: Diagnosis not present

## 2021-09-25 DIAGNOSIS — F0390 Unspecified dementia without behavioral disturbance: Secondary | ICD-10-CM | POA: Diagnosis not present

## 2021-09-25 DIAGNOSIS — I872 Venous insufficiency (chronic) (peripheral): Secondary | ICD-10-CM | POA: Diagnosis not present

## 2021-09-25 DIAGNOSIS — B372 Candidiasis of skin and nail: Secondary | ICD-10-CM | POA: Diagnosis not present

## 2021-09-25 DIAGNOSIS — E785 Hyperlipidemia, unspecified: Secondary | ICD-10-CM | POA: Diagnosis not present

## 2021-09-25 DIAGNOSIS — I890 Lymphedema, not elsewhere classified: Secondary | ICD-10-CM | POA: Diagnosis not present

## 2021-09-25 DIAGNOSIS — K5900 Constipation, unspecified: Secondary | ICD-10-CM | POA: Diagnosis not present

## 2021-09-25 DIAGNOSIS — E559 Vitamin D deficiency, unspecified: Secondary | ICD-10-CM | POA: Diagnosis not present

## 2021-09-29 DIAGNOSIS — R262 Difficulty in walking, not elsewhere classified: Secondary | ICD-10-CM | POA: Diagnosis not present

## 2021-09-29 DIAGNOSIS — R293 Abnormal posture: Secondary | ICD-10-CM | POA: Diagnosis not present

## 2021-09-29 DIAGNOSIS — M6281 Muscle weakness (generalized): Secondary | ICD-10-CM | POA: Diagnosis not present

## 2021-09-30 DIAGNOSIS — M6281 Muscle weakness (generalized): Secondary | ICD-10-CM | POA: Diagnosis not present

## 2021-09-30 DIAGNOSIS — R293 Abnormal posture: Secondary | ICD-10-CM | POA: Diagnosis not present

## 2021-09-30 DIAGNOSIS — R262 Difficulty in walking, not elsewhere classified: Secondary | ICD-10-CM | POA: Diagnosis not present

## 2021-10-02 DIAGNOSIS — R293 Abnormal posture: Secondary | ICD-10-CM | POA: Diagnosis not present

## 2021-10-02 DIAGNOSIS — M6281 Muscle weakness (generalized): Secondary | ICD-10-CM | POA: Diagnosis not present

## 2021-10-02 DIAGNOSIS — R262 Difficulty in walking, not elsewhere classified: Secondary | ICD-10-CM | POA: Diagnosis not present

## 2021-10-08 DIAGNOSIS — R293 Abnormal posture: Secondary | ICD-10-CM | POA: Diagnosis not present

## 2021-10-08 DIAGNOSIS — M6281 Muscle weakness (generalized): Secondary | ICD-10-CM | POA: Diagnosis not present

## 2021-10-08 DIAGNOSIS — R262 Difficulty in walking, not elsewhere classified: Secondary | ICD-10-CM | POA: Diagnosis not present

## 2021-10-10 DIAGNOSIS — R262 Difficulty in walking, not elsewhere classified: Secondary | ICD-10-CM | POA: Diagnosis not present

## 2021-10-10 DIAGNOSIS — M6281 Muscle weakness (generalized): Secondary | ICD-10-CM | POA: Diagnosis not present

## 2021-10-10 DIAGNOSIS — R293 Abnormal posture: Secondary | ICD-10-CM | POA: Diagnosis not present

## 2021-10-14 DIAGNOSIS — R262 Difficulty in walking, not elsewhere classified: Secondary | ICD-10-CM | POA: Diagnosis not present

## 2021-10-14 DIAGNOSIS — R293 Abnormal posture: Secondary | ICD-10-CM | POA: Diagnosis not present

## 2021-10-14 DIAGNOSIS — M6281 Muscle weakness (generalized): Secondary | ICD-10-CM | POA: Diagnosis not present

## 2021-10-15 DIAGNOSIS — E038 Other specified hypothyroidism: Secondary | ICD-10-CM | POA: Diagnosis not present

## 2021-10-15 DIAGNOSIS — E119 Type 2 diabetes mellitus without complications: Secondary | ICD-10-CM | POA: Diagnosis not present

## 2021-10-15 DIAGNOSIS — D518 Other vitamin B12 deficiency anemias: Secondary | ICD-10-CM | POA: Diagnosis not present

## 2021-10-15 DIAGNOSIS — E785 Hyperlipidemia, unspecified: Secondary | ICD-10-CM | POA: Diagnosis not present

## 2021-10-15 DIAGNOSIS — E559 Vitamin D deficiency, unspecified: Secondary | ICD-10-CM | POA: Diagnosis not present

## 2021-10-15 DIAGNOSIS — I10 Essential (primary) hypertension: Secondary | ICD-10-CM | POA: Diagnosis not present

## 2021-10-15 DIAGNOSIS — Z79899 Other long term (current) drug therapy: Secondary | ICD-10-CM | POA: Diagnosis not present

## 2021-10-15 DIAGNOSIS — E7849 Other hyperlipidemia: Secondary | ICD-10-CM | POA: Diagnosis not present

## 2021-10-16 DIAGNOSIS — R293 Abnormal posture: Secondary | ICD-10-CM | POA: Diagnosis not present

## 2021-10-16 DIAGNOSIS — R262 Difficulty in walking, not elsewhere classified: Secondary | ICD-10-CM | POA: Diagnosis not present

## 2021-10-16 DIAGNOSIS — M6281 Muscle weakness (generalized): Secondary | ICD-10-CM | POA: Diagnosis not present

## 2021-10-21 DIAGNOSIS — R293 Abnormal posture: Secondary | ICD-10-CM | POA: Diagnosis not present

## 2021-10-21 DIAGNOSIS — M6281 Muscle weakness (generalized): Secondary | ICD-10-CM | POA: Diagnosis not present

## 2021-10-21 DIAGNOSIS — R262 Difficulty in walking, not elsewhere classified: Secondary | ICD-10-CM | POA: Diagnosis not present

## 2021-10-22 DIAGNOSIS — E038 Other specified hypothyroidism: Secondary | ICD-10-CM | POA: Diagnosis not present

## 2021-10-22 DIAGNOSIS — E119 Type 2 diabetes mellitus without complications: Secondary | ICD-10-CM | POA: Diagnosis not present

## 2021-10-22 DIAGNOSIS — E785 Hyperlipidemia, unspecified: Secondary | ICD-10-CM | POA: Diagnosis not present

## 2021-10-22 DIAGNOSIS — I10 Essential (primary) hypertension: Secondary | ICD-10-CM | POA: Diagnosis not present

## 2021-10-22 DIAGNOSIS — F0390 Unspecified dementia without behavioral disturbance: Secondary | ICD-10-CM | POA: Diagnosis not present

## 2021-10-22 DIAGNOSIS — E559 Vitamin D deficiency, unspecified: Secondary | ICD-10-CM | POA: Diagnosis not present

## 2021-10-22 DIAGNOSIS — D518 Other vitamin B12 deficiency anemias: Secondary | ICD-10-CM | POA: Diagnosis not present

## 2021-10-23 DIAGNOSIS — K5900 Constipation, unspecified: Secondary | ICD-10-CM | POA: Diagnosis not present

## 2021-10-23 DIAGNOSIS — E785 Hyperlipidemia, unspecified: Secondary | ICD-10-CM | POA: Diagnosis not present

## 2021-10-23 DIAGNOSIS — I872 Venous insufficiency (chronic) (peripheral): Secondary | ICD-10-CM | POA: Diagnosis not present

## 2021-10-23 DIAGNOSIS — B372 Candidiasis of skin and nail: Secondary | ICD-10-CM | POA: Diagnosis not present

## 2021-10-23 DIAGNOSIS — E119 Type 2 diabetes mellitus without complications: Secondary | ICD-10-CM | POA: Diagnosis not present

## 2021-10-23 DIAGNOSIS — I10 Essential (primary) hypertension: Secondary | ICD-10-CM | POA: Diagnosis not present

## 2021-10-23 DIAGNOSIS — R600 Localized edema: Secondary | ICD-10-CM | POA: Diagnosis not present

## 2021-10-23 DIAGNOSIS — F0390 Unspecified dementia without behavioral disturbance: Secondary | ICD-10-CM | POA: Diagnosis not present

## 2021-10-23 DIAGNOSIS — M6281 Muscle weakness (generalized): Secondary | ICD-10-CM | POA: Diagnosis not present

## 2021-10-23 DIAGNOSIS — R262 Difficulty in walking, not elsewhere classified: Secondary | ICD-10-CM | POA: Diagnosis not present

## 2021-10-23 DIAGNOSIS — R293 Abnormal posture: Secondary | ICD-10-CM | POA: Diagnosis not present

## 2021-10-23 DIAGNOSIS — I890 Lymphedema, not elsewhere classified: Secondary | ICD-10-CM | POA: Diagnosis not present

## 2021-10-23 DIAGNOSIS — E559 Vitamin D deficiency, unspecified: Secondary | ICD-10-CM | POA: Diagnosis not present

## 2021-10-25 DIAGNOSIS — I10 Essential (primary) hypertension: Secondary | ICD-10-CM | POA: Diagnosis not present

## 2021-10-28 DIAGNOSIS — R262 Difficulty in walking, not elsewhere classified: Secondary | ICD-10-CM | POA: Diagnosis not present

## 2021-10-28 DIAGNOSIS — M6281 Muscle weakness (generalized): Secondary | ICD-10-CM | POA: Diagnosis not present

## 2021-10-28 DIAGNOSIS — R293 Abnormal posture: Secondary | ICD-10-CM | POA: Diagnosis not present

## 2021-10-30 DIAGNOSIS — M6281 Muscle weakness (generalized): Secondary | ICD-10-CM | POA: Diagnosis not present

## 2021-10-30 DIAGNOSIS — R011 Cardiac murmur, unspecified: Secondary | ICD-10-CM | POA: Diagnosis not present

## 2021-10-30 DIAGNOSIS — R293 Abnormal posture: Secondary | ICD-10-CM | POA: Diagnosis not present

## 2021-10-30 DIAGNOSIS — R262 Difficulty in walking, not elsewhere classified: Secondary | ICD-10-CM | POA: Diagnosis not present

## 2021-11-04 DIAGNOSIS — R293 Abnormal posture: Secondary | ICD-10-CM | POA: Diagnosis not present

## 2021-11-04 DIAGNOSIS — M6281 Muscle weakness (generalized): Secondary | ICD-10-CM | POA: Diagnosis not present

## 2021-11-04 DIAGNOSIS — R262 Difficulty in walking, not elsewhere classified: Secondary | ICD-10-CM | POA: Diagnosis not present

## 2021-11-06 DIAGNOSIS — R293 Abnormal posture: Secondary | ICD-10-CM | POA: Diagnosis not present

## 2021-11-06 DIAGNOSIS — R262 Difficulty in walking, not elsewhere classified: Secondary | ICD-10-CM | POA: Diagnosis not present

## 2021-11-06 DIAGNOSIS — M6281 Muscle weakness (generalized): Secondary | ICD-10-CM | POA: Diagnosis not present

## 2021-11-11 DIAGNOSIS — R293 Abnormal posture: Secondary | ICD-10-CM | POA: Diagnosis not present

## 2021-11-11 DIAGNOSIS — R262 Difficulty in walking, not elsewhere classified: Secondary | ICD-10-CM | POA: Diagnosis not present

## 2021-11-11 DIAGNOSIS — M6281 Muscle weakness (generalized): Secondary | ICD-10-CM | POA: Diagnosis not present

## 2021-11-13 DIAGNOSIS — R262 Difficulty in walking, not elsewhere classified: Secondary | ICD-10-CM | POA: Diagnosis not present

## 2021-11-13 DIAGNOSIS — M6281 Muscle weakness (generalized): Secondary | ICD-10-CM | POA: Diagnosis not present

## 2021-11-13 DIAGNOSIS — R293 Abnormal posture: Secondary | ICD-10-CM | POA: Diagnosis not present

## 2021-11-18 DIAGNOSIS — R293 Abnormal posture: Secondary | ICD-10-CM | POA: Diagnosis not present

## 2021-11-18 DIAGNOSIS — R262 Difficulty in walking, not elsewhere classified: Secondary | ICD-10-CM | POA: Diagnosis not present

## 2021-11-18 DIAGNOSIS — M6281 Muscle weakness (generalized): Secondary | ICD-10-CM | POA: Diagnosis not present

## 2021-11-19 DIAGNOSIS — M79671 Pain in right foot: Secondary | ICD-10-CM | POA: Diagnosis not present

## 2021-11-19 DIAGNOSIS — L600 Ingrowing nail: Secondary | ICD-10-CM | POA: Diagnosis not present

## 2021-11-19 DIAGNOSIS — E1140 Type 2 diabetes mellitus with diabetic neuropathy, unspecified: Secondary | ICD-10-CM | POA: Diagnosis not present

## 2021-11-19 DIAGNOSIS — M79672 Pain in left foot: Secondary | ICD-10-CM | POA: Diagnosis not present

## 2021-11-19 DIAGNOSIS — I890 Lymphedema, not elsewhere classified: Secondary | ICD-10-CM | POA: Diagnosis not present

## 2021-11-19 DIAGNOSIS — B351 Tinea unguium: Secondary | ICD-10-CM | POA: Diagnosis not present

## 2021-11-20 DIAGNOSIS — I10 Essential (primary) hypertension: Secondary | ICD-10-CM | POA: Diagnosis not present

## 2021-11-20 DIAGNOSIS — R262 Difficulty in walking, not elsewhere classified: Secondary | ICD-10-CM | POA: Diagnosis not present

## 2021-11-20 DIAGNOSIS — M6281 Muscle weakness (generalized): Secondary | ICD-10-CM | POA: Diagnosis not present

## 2021-11-20 DIAGNOSIS — E038 Other specified hypothyroidism: Secondary | ICD-10-CM | POA: Diagnosis not present

## 2021-11-20 DIAGNOSIS — R293 Abnormal posture: Secondary | ICD-10-CM | POA: Diagnosis not present

## 2021-11-20 DIAGNOSIS — D518 Other vitamin B12 deficiency anemias: Secondary | ICD-10-CM | POA: Diagnosis not present

## 2021-11-20 DIAGNOSIS — E785 Hyperlipidemia, unspecified: Secondary | ICD-10-CM | POA: Diagnosis not present

## 2021-11-20 DIAGNOSIS — E559 Vitamin D deficiency, unspecified: Secondary | ICD-10-CM | POA: Diagnosis not present

## 2021-11-20 DIAGNOSIS — E119 Type 2 diabetes mellitus without complications: Secondary | ICD-10-CM | POA: Diagnosis not present

## 2021-11-22 DIAGNOSIS — I10 Essential (primary) hypertension: Secondary | ICD-10-CM | POA: Diagnosis not present

## 2021-11-25 DIAGNOSIS — M6281 Muscle weakness (generalized): Secondary | ICD-10-CM | POA: Diagnosis not present

## 2021-11-25 DIAGNOSIS — R293 Abnormal posture: Secondary | ICD-10-CM | POA: Diagnosis not present

## 2021-11-25 DIAGNOSIS — R262 Difficulty in walking, not elsewhere classified: Secondary | ICD-10-CM | POA: Diagnosis not present

## 2021-11-27 DIAGNOSIS — M6281 Muscle weakness (generalized): Secondary | ICD-10-CM | POA: Diagnosis not present

## 2021-11-27 DIAGNOSIS — E559 Vitamin D deficiency, unspecified: Secondary | ICD-10-CM | POA: Diagnosis not present

## 2021-11-27 DIAGNOSIS — F0390 Unspecified dementia without behavioral disturbance: Secondary | ICD-10-CM | POA: Diagnosis not present

## 2021-11-27 DIAGNOSIS — R262 Difficulty in walking, not elsewhere classified: Secondary | ICD-10-CM | POA: Diagnosis not present

## 2021-11-27 DIAGNOSIS — R293 Abnormal posture: Secondary | ICD-10-CM | POA: Diagnosis not present

## 2021-11-27 DIAGNOSIS — I10 Essential (primary) hypertension: Secondary | ICD-10-CM | POA: Diagnosis not present

## 2021-11-27 DIAGNOSIS — E119 Type 2 diabetes mellitus without complications: Secondary | ICD-10-CM | POA: Diagnosis not present

## 2021-11-27 DIAGNOSIS — K5900 Constipation, unspecified: Secondary | ICD-10-CM | POA: Diagnosis not present

## 2021-11-27 DIAGNOSIS — E785 Hyperlipidemia, unspecified: Secondary | ICD-10-CM | POA: Diagnosis not present

## 2021-11-27 DIAGNOSIS — I872 Venous insufficiency (chronic) (peripheral): Secondary | ICD-10-CM | POA: Diagnosis not present

## 2021-11-28 DIAGNOSIS — R062 Wheezing: Secondary | ICD-10-CM | POA: Diagnosis not present

## 2021-12-02 DIAGNOSIS — R262 Difficulty in walking, not elsewhere classified: Secondary | ICD-10-CM | POA: Diagnosis not present

## 2021-12-02 DIAGNOSIS — M6281 Muscle weakness (generalized): Secondary | ICD-10-CM | POA: Diagnosis not present

## 2021-12-02 DIAGNOSIS — R293 Abnormal posture: Secondary | ICD-10-CM | POA: Diagnosis not present

## 2021-12-04 DIAGNOSIS — R293 Abnormal posture: Secondary | ICD-10-CM | POA: Diagnosis not present

## 2021-12-04 DIAGNOSIS — R262 Difficulty in walking, not elsewhere classified: Secondary | ICD-10-CM | POA: Diagnosis not present

## 2021-12-04 DIAGNOSIS — M6281 Muscle weakness (generalized): Secondary | ICD-10-CM | POA: Diagnosis not present

## 2021-12-09 DIAGNOSIS — R293 Abnormal posture: Secondary | ICD-10-CM | POA: Diagnosis not present

## 2021-12-09 DIAGNOSIS — M6281 Muscle weakness (generalized): Secondary | ICD-10-CM | POA: Diagnosis not present

## 2021-12-09 DIAGNOSIS — R262 Difficulty in walking, not elsewhere classified: Secondary | ICD-10-CM | POA: Diagnosis not present

## 2021-12-10 DIAGNOSIS — E7849 Other hyperlipidemia: Secondary | ICD-10-CM | POA: Diagnosis not present

## 2021-12-10 DIAGNOSIS — D518 Other vitamin B12 deficiency anemias: Secondary | ICD-10-CM | POA: Diagnosis not present

## 2021-12-10 DIAGNOSIS — E119 Type 2 diabetes mellitus without complications: Secondary | ICD-10-CM | POA: Diagnosis not present

## 2021-12-10 DIAGNOSIS — E785 Hyperlipidemia, unspecified: Secondary | ICD-10-CM | POA: Diagnosis not present

## 2021-12-10 DIAGNOSIS — E038 Other specified hypothyroidism: Secondary | ICD-10-CM | POA: Diagnosis not present

## 2021-12-10 DIAGNOSIS — E559 Vitamin D deficiency, unspecified: Secondary | ICD-10-CM | POA: Diagnosis not present

## 2021-12-10 DIAGNOSIS — Z79899 Other long term (current) drug therapy: Secondary | ICD-10-CM | POA: Diagnosis not present

## 2021-12-10 DIAGNOSIS — I10 Essential (primary) hypertension: Secondary | ICD-10-CM | POA: Diagnosis not present

## 2021-12-11 DIAGNOSIS — R262 Difficulty in walking, not elsewhere classified: Secondary | ICD-10-CM | POA: Diagnosis not present

## 2021-12-11 DIAGNOSIS — M6281 Muscle weakness (generalized): Secondary | ICD-10-CM | POA: Diagnosis not present

## 2021-12-11 DIAGNOSIS — R293 Abnormal posture: Secondary | ICD-10-CM | POA: Diagnosis not present

## 2021-12-12 DIAGNOSIS — E559 Vitamin D deficiency, unspecified: Secondary | ICD-10-CM | POA: Diagnosis not present

## 2021-12-12 DIAGNOSIS — E785 Hyperlipidemia, unspecified: Secondary | ICD-10-CM | POA: Diagnosis not present

## 2021-12-12 DIAGNOSIS — I872 Venous insufficiency (chronic) (peripheral): Secondary | ICD-10-CM | POA: Diagnosis not present

## 2021-12-12 DIAGNOSIS — N390 Urinary tract infection, site not specified: Secondary | ICD-10-CM | POA: Diagnosis not present

## 2021-12-12 DIAGNOSIS — I10 Essential (primary) hypertension: Secondary | ICD-10-CM | POA: Diagnosis not present

## 2021-12-12 DIAGNOSIS — B372 Candidiasis of skin and nail: Secondary | ICD-10-CM | POA: Diagnosis not present

## 2021-12-12 DIAGNOSIS — E119 Type 2 diabetes mellitus without complications: Secondary | ICD-10-CM | POA: Diagnosis not present

## 2021-12-12 DIAGNOSIS — K5900 Constipation, unspecified: Secondary | ICD-10-CM | POA: Diagnosis not present

## 2021-12-16 DIAGNOSIS — R262 Difficulty in walking, not elsewhere classified: Secondary | ICD-10-CM | POA: Diagnosis not present

## 2021-12-16 DIAGNOSIS — R293 Abnormal posture: Secondary | ICD-10-CM | POA: Diagnosis not present

## 2021-12-16 DIAGNOSIS — M6281 Muscle weakness (generalized): Secondary | ICD-10-CM | POA: Diagnosis not present

## 2021-12-18 DIAGNOSIS — M6281 Muscle weakness (generalized): Secondary | ICD-10-CM | POA: Diagnosis not present

## 2021-12-18 DIAGNOSIS — R262 Difficulty in walking, not elsewhere classified: Secondary | ICD-10-CM | POA: Diagnosis not present

## 2021-12-18 DIAGNOSIS — R293 Abnormal posture: Secondary | ICD-10-CM | POA: Diagnosis not present

## 2021-12-19 DIAGNOSIS — E119 Type 2 diabetes mellitus without complications: Secondary | ICD-10-CM | POA: Diagnosis not present

## 2021-12-19 DIAGNOSIS — I10 Essential (primary) hypertension: Secondary | ICD-10-CM | POA: Diagnosis not present

## 2021-12-19 DIAGNOSIS — E559 Vitamin D deficiency, unspecified: Secondary | ICD-10-CM | POA: Diagnosis not present

## 2021-12-19 DIAGNOSIS — D518 Other vitamin B12 deficiency anemias: Secondary | ICD-10-CM | POA: Diagnosis not present

## 2021-12-19 DIAGNOSIS — E038 Other specified hypothyroidism: Secondary | ICD-10-CM | POA: Diagnosis not present

## 2021-12-19 DIAGNOSIS — E785 Hyperlipidemia, unspecified: Secondary | ICD-10-CM | POA: Diagnosis not present

## 2021-12-23 DIAGNOSIS — I10 Essential (primary) hypertension: Secondary | ICD-10-CM | POA: Diagnosis not present

## 2021-12-23 DIAGNOSIS — M6281 Muscle weakness (generalized): Secondary | ICD-10-CM | POA: Diagnosis not present

## 2021-12-23 DIAGNOSIS — R293 Abnormal posture: Secondary | ICD-10-CM | POA: Diagnosis not present

## 2021-12-23 DIAGNOSIS — R262 Difficulty in walking, not elsewhere classified: Secondary | ICD-10-CM | POA: Diagnosis not present

## 2021-12-24 DIAGNOSIS — Z79899 Other long term (current) drug therapy: Secondary | ICD-10-CM | POA: Diagnosis not present

## 2021-12-24 DIAGNOSIS — D518 Other vitamin B12 deficiency anemias: Secondary | ICD-10-CM | POA: Diagnosis not present

## 2021-12-25 DIAGNOSIS — K5900 Constipation, unspecified: Secondary | ICD-10-CM | POA: Diagnosis not present

## 2021-12-25 DIAGNOSIS — R293 Abnormal posture: Secondary | ICD-10-CM | POA: Diagnosis not present

## 2021-12-25 DIAGNOSIS — E559 Vitamin D deficiency, unspecified: Secondary | ICD-10-CM | POA: Diagnosis not present

## 2021-12-25 DIAGNOSIS — E119 Type 2 diabetes mellitus without complications: Secondary | ICD-10-CM | POA: Diagnosis not present

## 2021-12-25 DIAGNOSIS — I10 Essential (primary) hypertension: Secondary | ICD-10-CM | POA: Diagnosis not present

## 2021-12-25 DIAGNOSIS — L0390 Cellulitis, unspecified: Secondary | ICD-10-CM | POA: Diagnosis not present

## 2021-12-25 DIAGNOSIS — M6281 Muscle weakness (generalized): Secondary | ICD-10-CM | POA: Diagnosis not present

## 2021-12-25 DIAGNOSIS — F0390 Unspecified dementia without behavioral disturbance: Secondary | ICD-10-CM | POA: Diagnosis not present

## 2021-12-25 DIAGNOSIS — I890 Lymphedema, not elsewhere classified: Secondary | ICD-10-CM | POA: Diagnosis not present

## 2021-12-25 DIAGNOSIS — I872 Venous insufficiency (chronic) (peripheral): Secondary | ICD-10-CM | POA: Diagnosis not present

## 2021-12-25 DIAGNOSIS — R262 Difficulty in walking, not elsewhere classified: Secondary | ICD-10-CM | POA: Diagnosis not present

## 2021-12-25 DIAGNOSIS — B372 Candidiasis of skin and nail: Secondary | ICD-10-CM | POA: Diagnosis not present

## 2021-12-25 DIAGNOSIS — D649 Anemia, unspecified: Secondary | ICD-10-CM | POA: Diagnosis not present

## 2022-01-01 DIAGNOSIS — R293 Abnormal posture: Secondary | ICD-10-CM | POA: Diagnosis not present

## 2022-01-01 DIAGNOSIS — M6281 Muscle weakness (generalized): Secondary | ICD-10-CM | POA: Diagnosis not present

## 2022-01-01 DIAGNOSIS — R262 Difficulty in walking, not elsewhere classified: Secondary | ICD-10-CM | POA: Diagnosis not present

## 2022-01-06 DIAGNOSIS — I6523 Occlusion and stenosis of bilateral carotid arteries: Secondary | ICD-10-CM | POA: Diagnosis not present

## 2022-01-06 DIAGNOSIS — R0989 Other specified symptoms and signs involving the circulatory and respiratory systems: Secondary | ICD-10-CM | POA: Diagnosis not present

## 2022-01-09 DIAGNOSIS — I77811 Abdominal aortic ectasia: Secondary | ICD-10-CM | POA: Diagnosis not present

## 2022-01-13 DIAGNOSIS — E042 Nontoxic multinodular goiter: Secondary | ICD-10-CM | POA: Diagnosis not present

## 2022-01-13 DIAGNOSIS — R221 Localized swelling, mass and lump, neck: Secondary | ICD-10-CM | POA: Diagnosis not present

## 2022-01-15 DIAGNOSIS — R062 Wheezing: Secondary | ICD-10-CM | POA: Diagnosis not present

## 2022-01-15 DIAGNOSIS — I872 Venous insufficiency (chronic) (peripheral): Secondary | ICD-10-CM | POA: Diagnosis not present

## 2022-01-15 DIAGNOSIS — E119 Type 2 diabetes mellitus without complications: Secondary | ICD-10-CM | POA: Diagnosis not present

## 2022-01-15 DIAGNOSIS — E559 Vitamin D deficiency, unspecified: Secondary | ICD-10-CM | POA: Diagnosis not present

## 2022-01-15 DIAGNOSIS — I890 Lymphedema, not elsewhere classified: Secondary | ICD-10-CM | POA: Diagnosis not present

## 2022-01-15 DIAGNOSIS — F0390 Unspecified dementia without behavioral disturbance: Secondary | ICD-10-CM | POA: Diagnosis not present

## 2022-01-15 DIAGNOSIS — K5900 Constipation, unspecified: Secondary | ICD-10-CM | POA: Diagnosis not present

## 2022-01-15 DIAGNOSIS — B372 Candidiasis of skin and nail: Secondary | ICD-10-CM | POA: Diagnosis not present

## 2022-01-15 DIAGNOSIS — D649 Anemia, unspecified: Secondary | ICD-10-CM | POA: Diagnosis not present

## 2022-01-15 DIAGNOSIS — I10 Essential (primary) hypertension: Secondary | ICD-10-CM | POA: Diagnosis not present

## 2022-01-16 DIAGNOSIS — R2242 Localized swelling, mass and lump, left lower limb: Secondary | ICD-10-CM | POA: Diagnosis not present

## 2022-01-21 DIAGNOSIS — D518 Other vitamin B12 deficiency anemias: Secondary | ICD-10-CM | POA: Diagnosis not present

## 2022-01-21 DIAGNOSIS — E038 Other specified hypothyroidism: Secondary | ICD-10-CM | POA: Diagnosis not present

## 2022-01-21 DIAGNOSIS — E785 Hyperlipidemia, unspecified: Secondary | ICD-10-CM | POA: Diagnosis not present

## 2022-01-21 DIAGNOSIS — E119 Type 2 diabetes mellitus without complications: Secondary | ICD-10-CM | POA: Diagnosis not present

## 2022-01-21 DIAGNOSIS — I10 Essential (primary) hypertension: Secondary | ICD-10-CM | POA: Diagnosis not present

## 2022-01-21 DIAGNOSIS — E559 Vitamin D deficiency, unspecified: Secondary | ICD-10-CM | POA: Diagnosis not present

## 2022-01-22 DIAGNOSIS — I872 Venous insufficiency (chronic) (peripheral): Secondary | ICD-10-CM | POA: Diagnosis not present

## 2022-01-22 DIAGNOSIS — E119 Type 2 diabetes mellitus without complications: Secondary | ICD-10-CM | POA: Diagnosis not present

## 2022-01-22 DIAGNOSIS — D649 Anemia, unspecified: Secondary | ICD-10-CM | POA: Diagnosis not present

## 2022-01-22 DIAGNOSIS — E559 Vitamin D deficiency, unspecified: Secondary | ICD-10-CM | POA: Diagnosis not present

## 2022-01-22 DIAGNOSIS — E785 Hyperlipidemia, unspecified: Secondary | ICD-10-CM | POA: Diagnosis not present

## 2022-01-22 DIAGNOSIS — J329 Chronic sinusitis, unspecified: Secondary | ICD-10-CM | POA: Diagnosis not present

## 2022-01-22 DIAGNOSIS — K5900 Constipation, unspecified: Secondary | ICD-10-CM | POA: Diagnosis not present

## 2022-01-22 DIAGNOSIS — I70213 Atherosclerosis of native arteries of extremities with intermittent claudication, bilateral legs: Secondary | ICD-10-CM | POA: Diagnosis not present

## 2022-01-22 DIAGNOSIS — B372 Candidiasis of skin and nail: Secondary | ICD-10-CM | POA: Diagnosis not present

## 2022-01-22 DIAGNOSIS — I890 Lymphedema, not elsewhere classified: Secondary | ICD-10-CM | POA: Diagnosis not present

## 2022-01-22 DIAGNOSIS — I10 Essential (primary) hypertension: Secondary | ICD-10-CM | POA: Diagnosis not present

## 2022-01-22 DIAGNOSIS — F0390 Unspecified dementia without behavioral disturbance: Secondary | ICD-10-CM | POA: Diagnosis not present

## 2022-01-23 DIAGNOSIS — I10 Essential (primary) hypertension: Secondary | ICD-10-CM | POA: Diagnosis not present

## 2022-01-26 DIAGNOSIS — J811 Chronic pulmonary edema: Secondary | ICD-10-CM | POA: Diagnosis not present

## 2022-01-29 ENCOUNTER — Ambulatory Visit (INDEPENDENT_AMBULATORY_CARE_PROVIDER_SITE_OTHER): Payer: PPO | Admitting: Nurse Practitioner

## 2022-01-29 DIAGNOSIS — R590 Localized enlarged lymph nodes: Secondary | ICD-10-CM | POA: Diagnosis not present

## 2022-02-04 DIAGNOSIS — Z79899 Other long term (current) drug therapy: Secondary | ICD-10-CM | POA: Diagnosis not present

## 2022-02-04 DIAGNOSIS — E119 Type 2 diabetes mellitus without complications: Secondary | ICD-10-CM | POA: Diagnosis not present

## 2022-02-04 DIAGNOSIS — E7849 Other hyperlipidemia: Secondary | ICD-10-CM | POA: Diagnosis not present

## 2022-02-04 DIAGNOSIS — D518 Other vitamin B12 deficiency anemias: Secondary | ICD-10-CM | POA: Diagnosis not present

## 2022-02-05 DIAGNOSIS — J811 Chronic pulmonary edema: Secondary | ICD-10-CM | POA: Diagnosis not present

## 2022-02-10 DIAGNOSIS — I70223 Atherosclerosis of native arteries of extremities with rest pain, bilateral legs: Secondary | ICD-10-CM | POA: Diagnosis not present

## 2022-02-12 DIAGNOSIS — N390 Urinary tract infection, site not specified: Secondary | ICD-10-CM | POA: Diagnosis not present

## 2022-02-17 DIAGNOSIS — E119 Type 2 diabetes mellitus without complications: Secondary | ICD-10-CM | POA: Diagnosis not present

## 2022-02-17 DIAGNOSIS — Z79899 Other long term (current) drug therapy: Secondary | ICD-10-CM | POA: Diagnosis not present

## 2022-02-17 DIAGNOSIS — I10 Essential (primary) hypertension: Secondary | ICD-10-CM | POA: Diagnosis not present

## 2022-02-17 DIAGNOSIS — E559 Vitamin D deficiency, unspecified: Secondary | ICD-10-CM | POA: Diagnosis not present

## 2022-02-17 DIAGNOSIS — E782 Mixed hyperlipidemia: Secondary | ICD-10-CM | POA: Diagnosis not present

## 2022-02-17 DIAGNOSIS — E038 Other specified hypothyroidism: Secondary | ICD-10-CM | POA: Diagnosis not present

## 2022-02-17 DIAGNOSIS — D518 Other vitamin B12 deficiency anemias: Secondary | ICD-10-CM | POA: Diagnosis not present

## 2022-02-20 DIAGNOSIS — M79672 Pain in left foot: Secondary | ICD-10-CM | POA: Diagnosis not present

## 2022-02-20 DIAGNOSIS — M2041 Other hammer toe(s) (acquired), right foot: Secondary | ICD-10-CM | POA: Diagnosis not present

## 2022-02-20 DIAGNOSIS — B351 Tinea unguium: Secondary | ICD-10-CM | POA: Diagnosis not present

## 2022-02-20 DIAGNOSIS — M2042 Other hammer toe(s) (acquired), left foot: Secondary | ICD-10-CM | POA: Diagnosis not present

## 2022-02-20 DIAGNOSIS — M79671 Pain in right foot: Secondary | ICD-10-CM | POA: Diagnosis not present

## 2022-02-20 DIAGNOSIS — E1140 Type 2 diabetes mellitus with diabetic neuropathy, unspecified: Secondary | ICD-10-CM | POA: Diagnosis not present

## 2022-02-20 DIAGNOSIS — I890 Lymphedema, not elsewhere classified: Secondary | ICD-10-CM | POA: Diagnosis not present

## 2022-02-20 DIAGNOSIS — L600 Ingrowing nail: Secondary | ICD-10-CM | POA: Diagnosis not present

## 2022-02-23 DIAGNOSIS — I10 Essential (primary) hypertension: Secondary | ICD-10-CM | POA: Diagnosis not present

## 2022-02-26 DIAGNOSIS — D649 Anemia, unspecified: Secondary | ICD-10-CM | POA: Diagnosis not present

## 2022-02-26 DIAGNOSIS — E119 Type 2 diabetes mellitus without complications: Secondary | ICD-10-CM | POA: Diagnosis not present

## 2022-02-26 DIAGNOSIS — I10 Essential (primary) hypertension: Secondary | ICD-10-CM | POA: Diagnosis not present

## 2022-02-26 DIAGNOSIS — I872 Venous insufficiency (chronic) (peripheral): Secondary | ICD-10-CM | POA: Diagnosis not present

## 2022-02-26 DIAGNOSIS — B372 Candidiasis of skin and nail: Secondary | ICD-10-CM | POA: Diagnosis not present

## 2022-02-26 DIAGNOSIS — E785 Hyperlipidemia, unspecified: Secondary | ICD-10-CM | POA: Diagnosis not present

## 2022-02-26 DIAGNOSIS — F0390 Unspecified dementia without behavioral disturbance: Secondary | ICD-10-CM | POA: Diagnosis not present

## 2022-02-26 DIAGNOSIS — E559 Vitamin D deficiency, unspecified: Secondary | ICD-10-CM | POA: Diagnosis not present

## 2022-03-01 ENCOUNTER — Other Ambulatory Visit: Payer: Self-pay

## 2022-03-01 ENCOUNTER — Inpatient Hospital Stay
Admission: EM | Admit: 2022-03-01 | Discharge: 2022-03-06 | DRG: 481 | Disposition: A | Discharge status: Skilled Nursing Facility | Payer: PPO | Attending: Internal Medicine | Admitting: Internal Medicine

## 2022-03-01 ENCOUNTER — Emergency Department: Payer: PPO

## 2022-03-01 DIAGNOSIS — Z882 Allergy status to sulfonamides status: Secondary | ICD-10-CM

## 2022-03-01 DIAGNOSIS — W010XXA Fall on same level from slipping, tripping and stumbling without subsequent striking against object, initial encounter: Secondary | ICD-10-CM | POA: Diagnosis present

## 2022-03-01 DIAGNOSIS — E1169 Type 2 diabetes mellitus with other specified complication: Secondary | ICD-10-CM | POA: Diagnosis present

## 2022-03-01 DIAGNOSIS — S72145D Nondisplaced intertrochanteric fracture of left femur, subsequent encounter for closed fracture with routine healing: Secondary | ICD-10-CM | POA: Diagnosis not present

## 2022-03-01 DIAGNOSIS — J984 Other disorders of lung: Secondary | ICD-10-CM | POA: Diagnosis not present

## 2022-03-01 DIAGNOSIS — Z6837 Body mass index (BMI) 37.0-37.9, adult: Secondary | ICD-10-CM | POA: Diagnosis not present

## 2022-03-01 DIAGNOSIS — Z7984 Long term (current) use of oral hypoglycemic drugs: Secondary | ICD-10-CM | POA: Diagnosis not present

## 2022-03-01 DIAGNOSIS — Z7401 Bed confinement status: Secondary | ICD-10-CM | POA: Diagnosis not present

## 2022-03-01 DIAGNOSIS — Z66 Do not resuscitate: Secondary | ICD-10-CM | POA: Diagnosis not present

## 2022-03-01 DIAGNOSIS — Z820 Family history of epilepsy and other diseases of the nervous system: Secondary | ICD-10-CM

## 2022-03-01 DIAGNOSIS — R000 Tachycardia, unspecified: Secondary | ICD-10-CM | POA: Diagnosis not present

## 2022-03-01 DIAGNOSIS — E119 Type 2 diabetes mellitus without complications: Secondary | ICD-10-CM | POA: Diagnosis not present

## 2022-03-01 DIAGNOSIS — R531 Weakness: Secondary | ICD-10-CM | POA: Diagnosis not present

## 2022-03-01 DIAGNOSIS — Z880 Allergy status to penicillin: Secondary | ICD-10-CM

## 2022-03-01 DIAGNOSIS — I700 Atherosclerosis of aorta: Secondary | ICD-10-CM | POA: Diagnosis not present

## 2022-03-01 DIAGNOSIS — E860 Dehydration: Secondary | ICD-10-CM | POA: Diagnosis not present

## 2022-03-01 DIAGNOSIS — S72002A Fracture of unspecified part of neck of left femur, initial encounter for closed fracture: Secondary | ICD-10-CM

## 2022-03-01 DIAGNOSIS — F03A4 Unspecified dementia, mild, with anxiety: Secondary | ICD-10-CM | POA: Diagnosis not present

## 2022-03-01 DIAGNOSIS — D62 Acute posthemorrhagic anemia: Secondary | ICD-10-CM | POA: Diagnosis not present

## 2022-03-01 DIAGNOSIS — F411 Generalized anxiety disorder: Secondary | ICD-10-CM | POA: Diagnosis not present

## 2022-03-01 DIAGNOSIS — Z79899 Other long term (current) drug therapy: Secondary | ICD-10-CM

## 2022-03-01 DIAGNOSIS — S72142D Displaced intertrochanteric fracture of left femur, subsequent encounter for closed fracture with routine healing: Secondary | ICD-10-CM | POA: Diagnosis not present

## 2022-03-01 DIAGNOSIS — S72142A Displaced intertrochanteric fracture of left femur, initial encounter for closed fracture: Principal | ICD-10-CM | POA: Diagnosis present

## 2022-03-01 DIAGNOSIS — N289 Disorder of kidney and ureter, unspecified: Secondary | ICD-10-CM

## 2022-03-01 DIAGNOSIS — N179 Acute kidney failure, unspecified: Secondary | ICD-10-CM | POA: Diagnosis not present

## 2022-03-01 DIAGNOSIS — E785 Hyperlipidemia, unspecified: Secondary | ICD-10-CM | POA: Diagnosis not present

## 2022-03-01 DIAGNOSIS — E876 Hypokalemia: Secondary | ICD-10-CM | POA: Diagnosis not present

## 2022-03-01 DIAGNOSIS — M25552 Pain in left hip: Secondary | ICD-10-CM | POA: Diagnosis not present

## 2022-03-01 DIAGNOSIS — Z01818 Encounter for other preprocedural examination: Secondary | ICD-10-CM | POA: Diagnosis not present

## 2022-03-01 DIAGNOSIS — E039 Hypothyroidism, unspecified: Secondary | ICD-10-CM | POA: Diagnosis present

## 2022-03-01 DIAGNOSIS — D518 Other vitamin B12 deficiency anemias: Secondary | ICD-10-CM | POA: Diagnosis present

## 2022-03-01 DIAGNOSIS — F0390 Unspecified dementia without behavioral disturbance: Secondary | ICD-10-CM | POA: Diagnosis present

## 2022-03-01 DIAGNOSIS — W19XXXA Unspecified fall, initial encounter: Principal | ICD-10-CM

## 2022-03-01 DIAGNOSIS — I890 Lymphedema, not elsewhere classified: Secondary | ICD-10-CM | POA: Diagnosis not present

## 2022-03-01 DIAGNOSIS — Z8249 Family history of ischemic heart disease and other diseases of the circulatory system: Secondary | ICD-10-CM | POA: Diagnosis not present

## 2022-03-01 DIAGNOSIS — I517 Cardiomegaly: Secondary | ICD-10-CM | POA: Diagnosis not present

## 2022-03-01 DIAGNOSIS — I10 Essential (primary) hypertension: Secondary | ICD-10-CM | POA: Diagnosis present

## 2022-03-01 DIAGNOSIS — E669 Obesity, unspecified: Secondary | ICD-10-CM | POA: Diagnosis present

## 2022-03-01 DIAGNOSIS — E559 Vitamin D deficiency, unspecified: Secondary | ICD-10-CM | POA: Diagnosis not present

## 2022-03-01 DIAGNOSIS — W19XXXD Unspecified fall, subsequent encounter: Secondary | ICD-10-CM | POA: Diagnosis not present

## 2022-03-01 DIAGNOSIS — E7800 Pure hypercholesterolemia, unspecified: Secondary | ICD-10-CM | POA: Diagnosis not present

## 2022-03-01 DIAGNOSIS — E538 Deficiency of other specified B group vitamins: Secondary | ICD-10-CM | POA: Diagnosis present

## 2022-03-01 DIAGNOSIS — I959 Hypotension, unspecified: Secondary | ICD-10-CM | POA: Diagnosis not present

## 2022-03-01 HISTORY — DX: Unspecified dementia, unspecified severity, without behavioral disturbance, psychotic disturbance, mood disturbance, and anxiety: F03.90

## 2022-03-01 LAB — CBC WITH DIFFERENTIAL/PLATELET
Abs Immature Granulocytes: 0.13 10*3/uL — ABNORMAL HIGH (ref 0.00–0.07)
Basophils Absolute: 0.1 10*3/uL (ref 0.0–0.1)
Basophils Relative: 0 %
Eosinophils Absolute: 0.1 10*3/uL (ref 0.0–0.5)
Eosinophils Relative: 0 %
HCT: 38.6 % (ref 36.0–46.0)
Hemoglobin: 12.4 g/dL (ref 12.0–15.0)
Immature Granulocytes: 1 %
Lymphocytes Relative: 5 %
Lymphs Abs: 1.1 10*3/uL (ref 0.7–4.0)
MCH: 27.1 pg (ref 26.0–34.0)
MCHC: 32.1 g/dL (ref 30.0–36.0)
MCV: 84.5 fL (ref 80.0–100.0)
Monocytes Absolute: 1.1 10*3/uL — ABNORMAL HIGH (ref 0.1–1.0)
Monocytes Relative: 5 %
Neutro Abs: 18.7 10*3/uL — ABNORMAL HIGH (ref 1.7–7.7)
Neutrophils Relative %: 89 %
Platelets: 332 10*3/uL (ref 150–400)
RBC: 4.57 MIL/uL (ref 3.87–5.11)
RDW: 17.2 % — ABNORMAL HIGH (ref 11.5–15.5)
WBC: 21.2 10*3/uL — ABNORMAL HIGH (ref 4.0–10.5)
nRBC: 0 % (ref 0.0–0.2)

## 2022-03-01 LAB — BASIC METABOLIC PANEL
Anion gap: 17 — ABNORMAL HIGH (ref 5–15)
BUN: 33 mg/dL — ABNORMAL HIGH (ref 8–23)
CO2: 32 mmol/L (ref 22–32)
Calcium: 9.9 mg/dL (ref 8.9–10.3)
Chloride: 90 mmol/L — ABNORMAL LOW (ref 98–111)
Creatinine, Ser: 1.3 mg/dL — ABNORMAL HIGH (ref 0.44–1.00)
GFR, Estimated: 40 mL/min — ABNORMAL LOW (ref >60)
Glucose, Bld: 159 mg/dL — ABNORMAL HIGH (ref 70–99)
Potassium: 2.9 mmol/L — ABNORMAL LOW (ref 3.5–5.1)
Sodium: 139 mmol/L (ref 135–145)

## 2022-03-01 MED ORDER — FENTANYL CITRATE PF 50 MCG/ML IJ SOSY
50.0000 ug | PREFILLED_SYRINGE | Freq: Once | INTRAMUSCULAR | Status: AC
  Administered 2022-03-01: 50 ug via INTRAVENOUS
  Filled 2022-03-01: qty 1

## 2022-03-01 MED ORDER — POLYETHYLENE GLYCOL 3350 17 G PO PACK
17.0000 g | PACK | Freq: Every day | ORAL | Status: DC | PRN
  Administered 2022-03-04: 17 g via ORAL
  Filled 2022-03-01: qty 1

## 2022-03-01 MED ORDER — POTASSIUM CHLORIDE CRYS ER 10 MEQ PO TBCR
20.0000 meq | EXTENDED_RELEASE_TABLET | Freq: Three times a day (TID) | ORAL | Status: DC
  Administered 2022-03-01 – 2022-03-03 (×2): 20 meq via ORAL
  Filled 2022-03-01 (×5): qty 2

## 2022-03-01 MED ORDER — POTASSIUM CHLORIDE ER 10 MEQ PO TBCR
20.0000 meq | EXTENDED_RELEASE_TABLET | Freq: Three times a day (TID) | ORAL | Status: DC
  Filled 2022-03-01: qty 2

## 2022-03-01 MED ORDER — LACTATED RINGERS IV SOLN: INTRAVENOUS | Status: DC

## 2022-03-01 MED ORDER — ONDANSETRON HCL 4 MG PO TABS: 4.0000 mg | ORAL_TABLET | Freq: Four times a day (QID) | ORAL | Status: DC | PRN

## 2022-03-01 MED ORDER — ATORVASTATIN CALCIUM 20 MG PO TABS
20.0000 mg | ORAL_TABLET | Freq: Every day | ORAL | Status: DC
  Administered 2022-03-03 – 2022-03-06 (×4): 20 mg via ORAL
  Filled 2022-03-01 (×4): qty 1

## 2022-03-01 MED ORDER — FENTANYL CITRATE PF 50 MCG/ML IJ SOSY: 12.5000 ug | PREFILLED_SYRINGE | INTRAMUSCULAR | Status: DC | PRN

## 2022-03-01 MED ORDER — OXYCODONE HCL 5 MG PO TABS: 5.0000 mg | ORAL_TABLET | ORAL | Status: DC | PRN

## 2022-03-01 MED ORDER — QUETIAPINE FUMARATE 25 MG PO TABS
25.0000 mg | ORAL_TABLET | Freq: Every day | ORAL | Status: DC
  Administered 2022-03-01 – 2022-03-05 (×4): 25 mg via ORAL
  Filled 2022-03-01 (×5): qty 1

## 2022-03-01 MED ORDER — ALBUTEROL SULFATE HFA 108 (90 BASE) MCG/ACT IN AERS: 2.0000 | INHALATION_SPRAY | RESPIRATORY_TRACT | Status: DC | PRN

## 2022-03-01 MED ORDER — METOPROLOL TARTRATE 5 MG/5ML IV SOLN: 5.0000 mg | Freq: Four times a day (QID) | INTRAVENOUS | Status: DC | PRN

## 2022-03-01 MED ORDER — ACETAMINOPHEN 650 MG RE SUPP: 650.0000 mg | Freq: Four times a day (QID) | RECTAL | Status: DC | PRN

## 2022-03-01 MED ORDER — CEFAZOLIN SODIUM-DEXTROSE 2-4 GM/100ML-% IV SOLN
2.0000 g | INTRAVENOUS | Status: AC
  Administered 2022-03-02: 2 g via INTRAVENOUS

## 2022-03-01 MED ORDER — ALBUTEROL SULFATE (2.5 MG/3ML) 0.083% IN NEBU: 2.5000 mg | INHALATION_SOLUTION | RESPIRATORY_TRACT | Status: DC | PRN

## 2022-03-01 MED ORDER — ACETAMINOPHEN 325 MG PO TABS: 650.0000 mg | ORAL_TABLET | Freq: Four times a day (QID) | ORAL | Status: DC | PRN

## 2022-03-01 MED ORDER — ONDANSETRON HCL 4 MG/2ML IJ SOLN: 4.0000 mg | Freq: Four times a day (QID) | INTRAMUSCULAR | Status: DC | PRN

## 2022-03-01 MED ORDER — METFORMIN HCL 500 MG PO TABS
500.0000 mg | ORAL_TABLET | Freq: Two times a day (BID) | ORAL | Status: DC
  Administered 2022-03-02 – 2022-03-06 (×8): 500 mg via ORAL
  Filled 2022-03-01 (×8): qty 1

## 2022-03-01 MED ORDER — LOSARTAN POTASSIUM 25 MG PO TABS
25.0000 mg | ORAL_TABLET | Freq: Every day | ORAL | Status: DC
  Administered 2022-03-04 – 2022-03-06 (×3): 25 mg via ORAL
  Filled 2022-03-01 (×4): qty 1

## 2022-03-01 MED ORDER — INSULIN ASPART 100 UNIT/ML IJ SOLN
0.0000 [IU] | Freq: Three times a day (TID) | INTRAMUSCULAR | Status: DC
  Administered 2022-03-02: 2 [IU] via SUBCUTANEOUS
  Administered 2022-03-02 – 2022-03-03 (×3): 3 [IU] via SUBCUTANEOUS
  Administered 2022-03-04: 2 [IU] via SUBCUTANEOUS
  Administered 2022-03-04: 3 [IU] via SUBCUTANEOUS
  Administered 2022-03-05: 2 [IU] via SUBCUTANEOUS
  Administered 2022-03-05: 3 [IU] via SUBCUTANEOUS
  Administered 2022-03-06: 2 [IU] via SUBCUTANEOUS
  Filled 2022-03-01 (×9): qty 1

## 2022-03-01 NOTE — Assessment & Plan Note (Signed)
Check level 

## 2022-03-01 NOTE — Hospital Course (Addendum)
88 year old female with history of T2DM, HTN, HLD, B12 deficiency, vitamin D deficiency, low thyroid, dementia who has a left intertrochanteric fracture s/p mechanical fall at  her facility

## 2022-03-01 NOTE — Assessment & Plan Note (Addendum)
--  s/p mechanical fall at [facility] --Seen  By Dr Adriana Simas surgery today --N.p.o. after midnight --No blood thinners

## 2022-03-01 NOTE — Assessment & Plan Note (Signed)
Check levels 

## 2022-03-01 NOTE — Assessment & Plan Note (Signed)
Unclear etiology though appears consistent with dehydration. We will give gentle IV hydration Avoid nephrotoxic agents Trend

## 2022-03-01 NOTE — ED Triage Notes (Signed)
Pt denies hitting head, denies use of blood thinners. Pt reports she dropped paper towel and when she bent down to pick it up she fell. No shortening or rotation noted to affected extremity. +CMS intact with pain noted upon movement

## 2022-03-01 NOTE — Assessment & Plan Note (Signed)
Continue Seroquel. 

## 2022-03-01 NOTE — Assessment & Plan Note (Signed)
Replete and trend 

## 2022-03-01 NOTE — ED Triage Notes (Signed)
FIRST NURSE NOTE:  Pt arrived via [facility] from [facility], pt had a fall on L side, c/o L hip pain, was eased down to the ground,   BP 153-69 p 95 99% RA CBG 161

## 2022-03-01 NOTE — ED Provider Notes (Signed)
[facility] Provider Note    Event Date/Time   First MD Initiated Contact with Patient 03/01/22 2124     (approximate)   History   Fall   HPI  Hannah Duran is a 88 y.o. female  who presents to the emergency department today after suffering a fall. The patient states that she normally ambulates with a walker. Had to bend down to grab some paper towels when she lost her balance and hit her left side. Had immediate onset of left hip pain. Denies hitting her head or any other injury.   Physical Exam   Triage Vital Signs: ED Triage Vitals  Enc Vitals Group     BP 03/01/22 1919 (!) 155/82     Pulse Rate 03/01/22 1919 100     Resp 03/01/22 1919 18     Temp 03/01/22 1919 98.3 F (36.8 C)     Temp Source 03/01/22 1919 Oral     SpO2 03/01/22 1919 95 %     Weight 03/01/22 1918 220 lb (99.8 kg)     Height 03/01/22 1918 '5\' 7"'$  (1.702 m)     Head Circumference --      Peak Flow --      Pain Score 03/01/22 1943 5     Pain Loc --      Pain Edu? --      Excl. in [place]? --     Most recent vital signs: Vitals:   03/01/22 2101 03/01/22 2102  BP: 126/60   Pulse:  91  Resp:  (!) 21  Temp:    SpO2:  98%   General: Awake, alert, oriented. CV:  Good peripheral perfusion. Regular rate and rhythm. Resp:  Normal effort. Lungs clear. Abd:  No distention.     ED Results / Procedures / Treatments   Labs (all labs ordered are listed, but only abnormal results are displayed) Labs Reviewed  CBC WITH DIFFERENTIAL/PLATELET - Abnormal; Notable for the following components:      Result Value   WBC 21.2 (*)    RDW 17.2 (*)    Neutro Abs 18.7 (*)    Monocytes Absolute 1.1 (*)    Abs Immature Granulocytes 0.13 (*)    All other components within normal limits  BASIC METABOLIC PANEL - Abnormal; Notable for the following components:   Potassium 2.9 (*)    Chloride 90 (*)    Glucose, Bld 159 (*)    BUN 33 (*)    Creatinine, Ser 1.30 (*)    GFR, Estimated 40 (*)     Anion gap 17 (*)    All other components within normal limits  CBC  COMPREHENSIVE METABOLIC PANEL     EKG  I, Nance Pear, attending physician, personally viewed and interpreted this EKG  EKG Time: 2147 Rate: 96 Rhythm: sinus tachycardia Axis: left axis deviation Intervals: qtc 455 QRS: incomplete LBBB ST changes: no st elevation Impression: abnormal ekg  RADIOLOGY I independently interpreted and visualized the left hip. My interpretation: Left hip fracture Radiology interpretation:  IMPRESSION:  Comminuted left intratrochanteric fracture.   I independently interpreted and visualized the cxr. My interpretation: No pneumonia. No pneumothorax.  Radiology interpretation:  IMPRESSION:  No acute abnormality noted.      PROCEDURES:  Critical Care performed: No  Procedures   MEDICATIONS ORDERED IN ED: Medications - No data to display   IMPRESSION / MDM / [place] / ED COURSE  I reviewed the triage vital signs and  the nursing notes.                              Differential diagnosis includes, but is not limited to, hip fracture, hip dislocation, contusion, trochanteric bursitis.  Patient's presentation is most consistent with acute presentation with potential threat to life or bodily function.  Patient presented to the emergency department today because of concerns for left hip pain after a fall.  X-rays consistent with an intertrochanteric fracture.  I discussed this with the patient.  Discussed with Dr. Mack Guise with orthopedics.  Discussed with Dr. Kennon Rounds with the hospital service will plan on admission.  FINAL CLINICAL IMPRESSION(S) / ED DIAGNOSES   Final diagnoses:  Fall, initial encounter  Closed fracture of left hip, initial encounter [facility])     Note:  This document was prepared using Dragon voice recognition software and may include unintentional dictation errors.    Nance Pear, MD 03/01/22 2300

## 2022-03-01 NOTE — Assessment & Plan Note (Addendum)
Continue losartan. 

## 2022-03-01 NOTE — Assessment & Plan Note (Signed)
[employer]

## 2022-03-01 NOTE — Assessment & Plan Note (Addendum)
--  Continue metformin --Sliding-scale insulin

## 2022-03-01 NOTE — H&P (Signed)
History and Physical    Patient: Hannah Duran PJK:[id number] DOB: [birth year] DOA: 03/01/2022 DOS: the patient was seen and examined on 03/01/2022 PCP: Abner Greenspan, MD  Patient coming from: ALF/ILF  Chief Complaint:  Chief Complaint  Patient presents with   Fall   HPI: Hannah Duran is a 88 y.o. female with medical history significant of T2DM, HLD, HTN, hypothyroid, dementia, lymphedema, B12 deficiency, vitamin D deficiency who is from an assisted living facility where she was bending over today with her walker to pick up a paper towel and fell on her left side.  She was found to have a left intertrochanteric fracture requiring surgical repair.  Review of Systems: As mentioned in the history of present illness. All other systems reviewed and are negative. Past Medical History:  Diagnosis Date   Anxiety    Congenital foot deformity    Dermatophytosis    Hallucinations    HLD (hyperlipidemia)    HTN (hypertension)    Hyperglycemia    Irregular heart rhythm    pt unsure of what type; followed by PCP only   Ischemic colitis ([place]) 7/13   on colonoscopy   Memory loss    Obesity    Rotator cuff syndrome    Past Surgical History:  Procedure Laterality Date   CATARACT EXTRACTION W/PHACO Right 09/09/2016   Procedure: CATARACT EXTRACTION PHACO AND INTRAOCULAR LENS PLACEMENT ([place]);  Surgeon: Leandrew Koyanagi, MD;  Location: [place];  Service: Ophthalmology;  Laterality: Right;  right   CATARACT EXTRACTION W/PHACO Left 12/16/2016   Procedure: CATARACT EXTRACTION PHACO AND INTRAOCULAR LENS PLACEMENT ([place])  Left;  Surgeon: Leandrew Koyanagi, MD;  Location: [place];  Service: Ophthalmology;  Laterality: Left;   HUMERUS FRACTURE SURGERY  10/1997   TOTAL ABDOMINAL HYSTERECTOMY  1980's   due to menometrorrhagia   Social History:  reports that she has never smoked. She has never used smokeless tobacco. She reports that she does not drink alcohol and does not use  drugs.  Allergies  Allergen Reactions   Penicillins     REACTION: Rash   Sulfamethoxazole-Trimethoprim     rash   Amoxicillin Rash    Family History  Problem Relation Age of Onset   Stroke Mother    Parkinsonism Mother    Hypertension Mother    Alzheimer's disease Mother    Cancer Father        bladder/prostate   Breast cancer Neg Hx     Prior to Admission medications   Medication Sig Start Date End Date Taking? Authorizing Provider  albuterol (VENTOLIN HFA) 108 (90 Base) MCG/ACT inhaler Inhale 2 puffs into the lungs every 4 (four) hours as needed for wheezing. 04/29/20  Yes Tower, Wynelle Fanny, MD  atorvastatin (LIPITOR) 20 MG tablet Take 1 tablet (20 mg total) by mouth daily. 09/19/20  Yes Tower, Wynelle Fanny, MD  furosemide (LASIX) 20 MG tablet Take 1 tablet (20 mg total) by mouth daily. 10/24/20  Yes Tower, Wynelle Fanny, MD  losartan (COZAAR) 25 MG tablet Take 1 tablet (25 mg total) by mouth daily. 09/19/20  Yes Tower, Wynelle Fanny, MD  metFORMIN (GLUCOPHAGE) 500 MG tablet Take 1 tablet (500 mg total) by mouth 2 (two) times daily with a meal. 09/19/20  Yes Tower, Wynelle Fanny, MD  metolazone (ZAROXOLYN) 2.5 MG tablet Take 2.5 mg by mouth daily. 02/26/22  Yes [provider]  NYSTATIN powder SMARTSIG:1 Application Topical 2-3 Times Daily 05/15/21  Yes [provider]  potassium chloride (  KLOR-CON) 10 MEQ tablet Take 1 tablet (10 mEq total) by mouth daily. Take one pill by mouth once weekly when you take your furosemide 09/19/20  Yes Tower, Wynelle Fanny, MD  QUEtiapine (SEROQUEL) 25 MG tablet Take 1 tablet (25 mg total) by mouth at bedtime. 01/18/20  Yes Suzzanne Cloud, NP  torsemide (DEMADEX) 20 MG tablet Take 20 mg by mouth 2 (two) times daily. 01/30/22  Yes [provider]    Physical Exam: Vitals:   03/01/22 2130 03/01/22 2220 03/01/22 2221 03/01/22 2222  BP: 137/70 130/76    Pulse: 93 (!) 101    Resp: 20 20    Temp:    98.2 F (36.8 C)  TempSrc:    Oral  SpO2: 98%  95%    Weight:      Height:       Physical Examination: General appearance - alert, well appearing, and in no distress and overweight Chest - clear to auscultation, no wheezes, rales or rhonchi, symmetric air entry Heart - normal rate, regular rhythm, normal S1, S2, no murmurs, rubs, clicks or gallops Abdomen - soft, nontender, nondistended, no masses or organomegaly Neurological -patient is oriented to self, time.  She mostly answers questions independently but clearly has a few deficits Extremities - pedal edema 2-3+ Skin - normal coloration and turgor, no rashes, no suspicious skin lesions noted  Data Reviewed: Results for orders placed or performed during the hospital encounter of 03/01/22 (from the past 24 hour(s))  CBC with Differential     Status: Abnormal   Collection Time: 03/01/22  9:57 PM  Result Value Ref Range   WBC 21.2 (H) 4.0 - 10.5 K/uL   RBC 4.57 3.87 - 5.11 MIL/uL   Hemoglobin 12.4 12.0 - 15.0 g/dL   HCT 38.6 36.0 - 46.0 %   MCV 84.5 80.0 - 100.0 fL   MCH 27.1 26.0 - 34.0 pg   MCHC 32.1 30.0 - 36.0 g/dL   RDW 17.2 (H) 11.5 - 15.5 %   Platelets 332 150 - 400 K/uL   nRBC 0.0 0.0 - 0.2 %   Neutrophils Relative % 89 %   Neutro Abs 18.7 (H) 1.7 - 7.7 K/uL   Lymphocytes Relative 5 %   Lymphs Abs 1.1 0.7 - 4.0 K/uL   Monocytes Relative 5 %   Monocytes Absolute 1.1 (H) 0.1 - 1.0 K/uL   Eosinophils Relative 0 %   Eosinophils Absolute 0.1 0.0 - 0.5 K/uL   Basophils Relative 0 %   Basophils Absolute 0.1 0.0 - 0.1 K/uL   Immature Granulocytes 1 %   Abs Immature Granulocytes 0.13 (H) 0.00 - 0.07 K/uL  Basic metabolic panel     Status: Abnormal   Collection Time: 03/01/22  9:57 PM  Result Value Ref Range   Sodium 139 135 - 145 mmol/L   Potassium 2.9 (L) 3.5 - 5.1 mmol/L   Chloride 90 (L) 98 - 111 mmol/L   CO2 32 22 - 32 mmol/L   Glucose, Bld 159 (H) 70 - 99 mg/dL   BUN 33 (H) 8 - 23 mg/dL   Creatinine, Ser 1.30 (H) 0.44 - 1.00 mg/dL   Calcium 9.9 8.9 - 10.3 mg/dL    GFR, Estimated 40 (L) >60 mL/min   Anion gap 17 (H) 5 - 15   DG Chest 1 View  Result Date: 03/01/2022 CLINICAL DATA:  Known left hip fracture EXAM: CHEST  1 VIEW COMPARISON:  02/26/2021 FINDINGS: Cardiac shadow is enlarged. Aortic calcifications are  again noted. Lungs are well aerated with minimal left basilar scarring stable from the prior exam. No bony abnormality is seen. IMPRESSION: No acute abnormality noted. Electronically Signed   By: Inez Catalina M.D.   On: 03/01/2022 20:37   DG Hip Unilat With Pelvis 2-3 Views Left  Result Date: 03/01/2022 CLINICAL DATA:  Recent fall with left hip pain, initial encounter EXAM: DG HIP (WITH OR WITHOUT PELVIS) 3V LEFT COMPARISON:  02/26/2021 FINDINGS: Pelvic ring is intact. Comminuted intratrochanteric fracture is noted in the left femur with impaction and angulation at the fracture site. No soft tissue abnormality is seen. IMPRESSION: Comminuted left intratrochanteric fracture. Electronically Signed   By: Inez Catalina M.D.   On: 03/01/2022 20:37     Assessment and Plan: * Closed hip fracture requiring operative repair, left, initial encounter ([place]) Per Ortho For surgery in the morning N.p.o. after midnight No blood thinners  Acute renal insufficiency Unclear etiology though appears consistent with dehydration. We will give gentle IV hydration Avoid nephrotoxic agents Trend  Vitamin D deficiency Check level  Hypothyroidism Check levels  Lymphedema Holding Zaroxolyn and Demadex  Hypokalemia Replete and trend  Dementia ([place]) Continue Seroquel  Vitamin B12 deficiency Check level  Diabetes type 2, controlled (HCC) Continue metformin Check CBGs Sliding-scale insulin  Hyperlipidemia associated with type 2 diabetes mellitus (HCC) Continue Lipitor  HTN (hypertension) Continue losartan    Advance Care Planning:   Code Status: Prior DNR  Consults: Orthopedics  Family Communication: Daughter at bedside  Severity of Illness: The  appropriate patient status for this patient is INPATIENT. Inpatient status is judged to be reasonable and necessary in order to provide the required intensity of service to ensure the patient's safety. The patient's presenting symptoms, physical exam findings, and initial radiographic and laboratory data in the context of their chronic comorbidities is felt to place them at high risk for further clinical deterioration. Furthermore, it is not anticipated that the patient will be medically stable for discharge from the hospital within 2 midnights of admission.   * I certify that at the point of admission it is my clinical judgment that the patient will require inpatient hospital care spanning beyond 2 midnights from the point of admission due to high intensity of service, high risk for further deterioration and high frequency of surveillance required.*  Author: Donnamae Jude, MD 03/01/2022 10:47 PM  For on call review [email or web address].

## 2022-03-01 NOTE — Assessment & Plan Note (Addendum)
-  Continue Lipitor °

## 2022-03-02 ENCOUNTER — Inpatient Hospital Stay: Payer: PPO | Admitting: Anesthesiology

## 2022-03-02 ENCOUNTER — Other Ambulatory Visit: Payer: Self-pay

## 2022-03-02 ENCOUNTER — Inpatient Hospital Stay: Payer: PPO

## 2022-03-02 ENCOUNTER — Encounter
Admission: EM | Disposition: A | Discharge status: Skilled Nursing Facility | Payer: Self-pay | Source: Home / Self Care | Attending: Internal Medicine

## 2022-03-02 ENCOUNTER — Encounter: Payer: Self-pay | Admitting: Family Medicine

## 2022-03-02 DIAGNOSIS — S72002A Fracture of unspecified part of neck of left femur, initial encounter for closed fracture: Secondary | ICD-10-CM | POA: Diagnosis not present

## 2022-03-02 HISTORY — PX: INTRAMEDULLARY (IM) NAIL INTERTROCHANTERIC: SHX5875

## 2022-03-02 LAB — COMPREHENSIVE METABOLIC PANEL
ALT: 19 U/L (ref 0–44)
AST: 25 U/L (ref 15–41)
Albumin: 3.6 g/dL (ref 3.5–5.0)
Alkaline Phosphatase: 90 U/L (ref 38–126)
Anion gap: 13 (ref 5–15)
BUN: 35 mg/dL — ABNORMAL HIGH (ref 8–23)
CO2: 32 mmol/L (ref 22–32)
Calcium: 9 mg/dL (ref 8.9–10.3)
Chloride: 93 mmol/L — ABNORMAL LOW (ref 98–111)
Creatinine, Ser: 1.13 mg/dL — ABNORMAL HIGH (ref 0.44–1.00)
GFR, Estimated: 48 mL/min — ABNORMAL LOW (ref >60)
Glucose, Bld: 157 mg/dL — ABNORMAL HIGH (ref 70–99)
Potassium: 2.9 mmol/L — ABNORMAL LOW (ref 3.5–5.1)
Sodium: 138 mmol/L (ref 135–145)
Total Bilirubin: 0.8 mg/dL (ref 0.3–1.2)
Total Protein: 6.4 g/dL — ABNORMAL LOW (ref 6.5–8.1)

## 2022-03-02 LAB — GLUCOSE, CAPILLARY
Glucose-Capillary: 130 mg/dL — ABNORMAL HIGH (ref 70–99)
Glucose-Capillary: 112 mg/dL — ABNORMAL HIGH (ref 70–99)
Glucose-Capillary: 163 mg/dL — ABNORMAL HIGH (ref 70–99)
Glucose-Capillary: 173 mg/dL — ABNORMAL HIGH (ref 70–99)
Glucose-Capillary: 197 mg/dL — ABNORMAL HIGH (ref 70–99)

## 2022-03-02 LAB — CBC
HCT: 32.4 % — ABNORMAL LOW (ref 36.0–46.0)
Hemoglobin: 10.6 g/dL — ABNORMAL LOW (ref 12.0–15.0)
MCH: 27.3 pg (ref 26.0–34.0)
MCHC: 32.7 g/dL (ref 30.0–36.0)
MCV: 83.5 fL (ref 80.0–100.0)
Platelets: 286 10*3/uL (ref 150–400)
RBC: 3.88 MIL/uL (ref 3.87–5.11)
RDW: 17 % — ABNORMAL HIGH (ref 11.5–15.5)
WBC: 16.7 10*3/uL — ABNORMAL HIGH (ref 4.0–10.5)
nRBC: 0 % (ref 0.0–0.2)

## 2022-03-02 LAB — POCT I-STAT, CHEM 8
BUN: 25 mg/dL — ABNORMAL HIGH (ref 8–23)
Calcium, Ion: 1.06 mmol/L — ABNORMAL LOW (ref 1.15–1.40)
Chloride: 92 mmol/L — ABNORMAL LOW (ref 98–111)
Creatinine, Ser: 1.2 mg/dL — ABNORMAL HIGH (ref 0.44–1.00)
Glucose, Bld: 114 mg/dL — ABNORMAL HIGH (ref 70–99)
HCT: 31 % — ABNORMAL LOW (ref 36.0–46.0)
Hemoglobin: 10.5 g/dL — ABNORMAL LOW (ref 12.0–15.0)
Potassium: 3.1 mmol/L — ABNORMAL LOW (ref 3.5–5.1)
Sodium: 136 mmol/L (ref 135–145)
TCO2: 32 mmol/L (ref 22–32)

## 2022-03-02 LAB — VITAMIN D 25 HYDROXY (VIT D DEFICIENCY, FRACTURES): Vit D, 25-Hydroxy: 31.24 ng/mL (ref 30–100)

## 2022-03-02 LAB — TYPE AND SCREEN
ABO/RH(D): A NEG
Antibody Screen: NEGATIVE

## 2022-03-02 LAB — TSH: TSH: 2.243 u[IU]/mL (ref 0.350–4.500)

## 2022-03-02 LAB — VITAMIN B12: Vitamin B-12: 142 pg/mL — ABNORMAL LOW (ref 180–914)

## 2022-03-02 LAB — POTASSIUM: Potassium: 3.2 mmol/L — ABNORMAL LOW (ref 3.5–5.1)

## 2022-03-02 SURGERY — FIXATION, FRACTURE, INTERTROCHANTERIC, WITH INTRAMEDULLARY ROD
Anesthesia: General | Laterality: Left

## 2022-03-02 MED ORDER — PROPOFOL 10 MG/ML IV BOLUS
INTRAVENOUS | Status: DC | PRN
  Administered 2022-03-02: 10 mg via INTRAVENOUS
  Administered 2022-03-02: 20 mg via INTRAVENOUS
  Administered 2022-03-02: 40 mg via INTRAVENOUS
  Administered 2022-03-02: 20 mg via INTRAVENOUS
  Administered 2022-03-02: 30 mg via INTRAVENOUS

## 2022-03-02 MED ORDER — PHENOL 1.4 % MT LIQD: 1.0000 | OROMUCOSAL | Status: DC | PRN

## 2022-03-02 MED ORDER — FENTANYL CITRATE (PF) 100 MCG/2ML IJ SOLN
INTRAMUSCULAR | Status: AC
  Filled 2022-03-02: qty 2

## 2022-03-02 MED ORDER — ALUM & MAG HYDROXIDE-SIMETH 200-200-20 MG/5ML PO SUSP: 30.0000 mL | ORAL | Status: DC | PRN

## 2022-03-02 MED ORDER — SUGAMMADEX SODIUM 200 MG/2ML IV SOLN
INTRAVENOUS | Status: DC | PRN
  Administered 2022-03-02: 200 mg via INTRAVENOUS

## 2022-03-02 MED ORDER — BISACODYL 10 MG RE SUPP: 10.0000 mg | Freq: Every day | RECTAL | Status: DC | PRN

## 2022-03-02 MED ORDER — TRAMADOL HCL 50 MG PO TABS
50.0000 mg | ORAL_TABLET | Freq: Four times a day (QID) | ORAL | Status: DC
  Administered 2022-03-02 – 2022-03-06 (×5): 50 mg via ORAL
  Filled 2022-03-02 (×9): qty 1

## 2022-03-02 MED ORDER — LIDOCAINE HCL (CARDIAC) PF 100 MG/5ML IV SOSY
PREFILLED_SYRINGE | INTRAVENOUS | Status: DC | PRN
  Administered 2022-03-02: 40 mg via INTRAVENOUS

## 2022-03-02 MED ORDER — 0.9 % SODIUM CHLORIDE (POUR BTL) OPTIME
TOPICAL | Status: DC | PRN
  Administered 2022-03-02: 500 mL

## 2022-03-02 MED ORDER — DEXAMETHASONE SODIUM PHOSPHATE 10 MG/ML IJ SOLN
INTRAMUSCULAR | Status: DC | PRN
  Administered 2022-03-02: 5 mg via INTRAVENOUS

## 2022-03-02 MED ORDER — FENTANYL CITRATE (PF) 100 MCG/2ML IJ SOLN
INTRAMUSCULAR | Status: DC | PRN
  Administered 2022-03-02 (×4): 25 ug via INTRAVENOUS

## 2022-03-02 MED ORDER — ROCURONIUM BROMIDE 100 MG/10ML IV SOLN
INTRAVENOUS | Status: DC | PRN
  Administered 2022-03-02: 30 mg via INTRAVENOUS
  Administered 2022-03-02: 40 mg via INTRAVENOUS

## 2022-03-02 MED ORDER — CEFAZOLIN SODIUM-DEXTROSE 2-4 GM/100ML-% IV SOLN
INTRAVENOUS | Status: AC
  Administered 2022-03-03: 2 g via INTRAVENOUS
  Filled 2022-03-02: qty 100

## 2022-03-02 MED ORDER — ONDANSETRON HCL 4 MG/2ML IJ SOLN: 4.0000 mg | Freq: Four times a day (QID) | INTRAMUSCULAR | Status: DC | PRN

## 2022-03-02 MED ORDER — ONDANSETRON HCL 4 MG/2ML IJ SOLN
INTRAMUSCULAR | Status: DC | PRN
  Administered 2022-03-02: 4 mg via INTRAVENOUS

## 2022-03-02 MED ORDER — TRANEXAMIC ACID-NACL 1000-0.7 MG/100ML-% IV SOLN: 1000.0000 mg | Freq: Once | INTRAVENOUS | Status: AC

## 2022-03-02 MED ORDER — ONDANSETRON HCL 4 MG PO TABS: 4.0000 mg | ORAL_TABLET | Freq: Four times a day (QID) | ORAL | Status: DC | PRN

## 2022-03-02 MED ORDER — ENOXAPARIN SODIUM 60 MG/0.6ML IJ SOSY
0.5000 mg/kg | PREFILLED_SYRINGE | INTRAMUSCULAR | Status: DC
  Administered 2022-03-03 – 2022-03-06 (×4): 55 mg via SUBCUTANEOUS
  Filled 2022-03-02 (×4): qty 0.6

## 2022-03-02 MED ORDER — ACETAMINOPHEN 500 MG PO TABS
1000.0000 mg | ORAL_TABLET | Freq: Four times a day (QID) | ORAL | Status: AC
  Administered 2022-03-02 – 2022-03-03 (×2): 1000 mg via ORAL
  Filled 2022-03-02 (×2): qty 2

## 2022-03-02 MED ORDER — PROPOFOL 10 MG/ML IV BOLUS
INTRAVENOUS | Status: AC
  Filled 2022-03-02: qty 20

## 2022-03-02 MED ORDER — ONDANSETRON HCL 4 MG/2ML IJ SOLN: 4.0000 mg | Freq: Once | INTRAMUSCULAR | Status: DC | PRN

## 2022-03-02 MED ORDER — METHOCARBAMOL 1000 MG/10ML IJ SOLN: 500.0000 mg | Freq: Four times a day (QID) | INTRAVENOUS | Status: DC | PRN

## 2022-03-02 MED ORDER — DEXMEDETOMIDINE HCL IN NACL 80 MCG/20ML IV SOLN
INTRAVENOUS | Status: AC
  Filled 2022-03-02: qty 20

## 2022-03-02 MED ORDER — METHOCARBAMOL 500 MG PO TABS: 500.0000 mg | ORAL_TABLET | Freq: Four times a day (QID) | ORAL | Status: DC | PRN

## 2022-03-02 MED ORDER — SUCCINYLCHOLINE CHLORIDE 200 MG/10ML IV SOSY
PREFILLED_SYRINGE | INTRAVENOUS | Status: DC | PRN
  Administered 2022-03-02: 100 mg via INTRAVENOUS

## 2022-03-02 MED ORDER — POTASSIUM CHLORIDE CRYS ER 20 MEQ PO TBCR: 40.0000 meq | EXTENDED_RELEASE_TABLET | ORAL | Status: DC

## 2022-03-02 MED ORDER — HYDROMORPHONE HCL 1 MG/ML IJ SOLN: 0.5000 mg | INTRAMUSCULAR | Status: DC | PRN

## 2022-03-02 MED ORDER — TRANEXAMIC ACID-NACL 1000-0.7 MG/100ML-% IV SOLN
INTRAVENOUS | Status: AC
  Administered 2022-03-02: 1000 mg via INTRAVENOUS
  Filled 2022-03-02: qty 100

## 2022-03-02 MED ORDER — OXYCODONE HCL 5 MG PO TABS: 10.0000 mg | ORAL_TABLET | ORAL | Status: DC | PRN

## 2022-03-02 MED ORDER — DEXAMETHASONE SODIUM PHOSPHATE 10 MG/ML IJ SOLN
INTRAMUSCULAR | Status: AC
  Filled 2022-03-02: qty 1

## 2022-03-02 MED ORDER — MENTHOL 3 MG MT LOZG
1.0000 | LOZENGE | OROMUCOSAL | Status: DC | PRN
Start: 2022-03-02 — End: 2022-03-06

## 2022-03-02 MED ORDER — FENTANYL CITRATE (PF) 100 MCG/2ML IJ SOLN: 25.0000 ug | INTRAMUSCULAR | Status: DC | PRN

## 2022-03-02 MED ORDER — OXYCODONE HCL 5 MG PO TABS: 5.0000 mg | ORAL_TABLET | ORAL | Status: DC | PRN

## 2022-03-02 MED ORDER — SODIUM CHLORIDE 0.9 % IV SOLN: INTRAVENOUS | Status: DC

## 2022-03-02 MED ORDER — SENNA 8.6 MG PO TABS
1.0000 | ORAL_TABLET | Freq: Two times a day (BID) | ORAL | Status: DC
  Administered 2022-03-03 – 2022-03-06 (×7): 8.6 mg via ORAL
  Filled 2022-03-02 (×8): qty 1

## 2022-03-02 MED ORDER — ONDANSETRON HCL 4 MG/2ML IJ SOLN
INTRAMUSCULAR | Status: AC
  Filled 2022-03-02: qty 2

## 2022-03-02 MED ORDER — DOCUSATE SODIUM 100 MG PO CAPS
100.0000 mg | ORAL_CAPSULE | Freq: Two times a day (BID) | ORAL | Status: DC
  Administered 2022-03-03 – 2022-03-05 (×5): 100 mg via ORAL
  Filled 2022-03-02 (×7): qty 1

## 2022-03-02 MED ORDER — CEFAZOLIN SODIUM-DEXTROSE 2-4 GM/100ML-% IV SOLN
2.0000 g | Freq: Four times a day (QID) | INTRAVENOUS | Status: AC
  Administered 2022-03-02: 2 g via INTRAVENOUS
  Filled 2022-03-02 (×2): qty 100

## 2022-03-02 MED ORDER — POTASSIUM CHLORIDE 10 MEQ/100ML IV SOLN
10.0000 meq | INTRAVENOUS | Status: AC
  Administered 2022-03-02 (×4): 10 meq via INTRAVENOUS
  Filled 2022-03-02 (×4): qty 100

## 2022-03-02 MED ORDER — FERROUS SULFATE 325 (65 FE) MG PO TABS
325.0000 mg | ORAL_TABLET | Freq: Three times a day (TID) | ORAL | Status: DC
Start: 2022-03-02 — End: 2022-03-06
  Administered 2022-03-02 – 2022-03-06 (×11): 325 mg via ORAL
  Filled 2022-03-02 (×11): qty 1

## 2022-03-02 SURGICAL SUPPLY — 45 items
BIT DRILL 4.3MMS DISTAL GRDTED (BIT) IMPLANT
BNDG CMPR 5X6 CHSV STRCH STRL (GAUZE/BANDAGES/DRESSINGS) ×2
BNDG COHESIVE 6X5 TAN ST LF (GAUZE/BANDAGES/DRESSINGS) ×6 IMPLANT
CORTICAL BONE SCR 5.0MM X 46MM (Screw) ×2 IMPLANT
DRAPE 3/4 80X56 (DRAPES) ×6 IMPLANT
DRAPE SURG 17X11 SM STRL (DRAPES) ×6 IMPLANT
DRAPE U-SHAPE 47X51 STRL (DRAPES) ×6 IMPLANT
DRILL 4.3MMS DISTAL GRADUATED (BIT) ×2
DRSG OPSITE POSTOP 3X4 (GAUZE/BANDAGES/DRESSINGS) ×6 IMPLANT
DRSG OPSITE POSTOP 4X14 (GAUZE/BANDAGES/DRESSINGS) IMPLANT
DRSG OPSITE POSTOP 4X6 (GAUZE/BANDAGES/DRESSINGS) ×3 IMPLANT
DURAPREP 26ML APPLICATOR (WOUND CARE) ×6 IMPLANT
ELECT REM PT RETURN 9FT ADLT (ELECTROSURGICAL) ×2
ELECTRODE REM PT RTRN 9FT ADLT (ELECTROSURGICAL) ×2 IMPLANT
GLOVE BIOGEL PI IND STRL 9 (GLOVE) ×2 IMPLANT
GLOVE BIOGEL PI INDICATOR 9 (GLOVE) ×1
GLOVE SURG ORTHO 9.0 STRL STRW (GLOVE) ×6 IMPLANT
GOWN STRL REUS TWL 2XL XL LVL4 (GOWN DISPOSABLE) ×3 IMPLANT
GOWN STRL REUS W/ TWL LRG LVL3 (GOWN DISPOSABLE) ×2 IMPLANT
GOWN STRL REUS W/TWL LRG LVL3 (GOWN DISPOSABLE) ×2
GUIDEPIN VERSANAIL DSP 3.2X444 (ORTHOPEDIC DISPOSABLE SUPPLIES) ×1 IMPLANT
GUIDEWIRE BALL NOSE 100CM (WIRE) ×1 IMPLANT
HEMOVAC 400CC 10FR (MISCELLANEOUS) IMPLANT
HFN LAG SCREW 10.5MM X 115MM (Orthopedic Implant) ×1 IMPLANT
HFN LH 130 DEG 9MM X 360MM (Nail) ×1 IMPLANT
KIT TURNOVER CYSTO (KITS) ×3 IMPLANT
MANIFOLD NEPTUNE II (INSTRUMENTS) ×3 IMPLANT
MAT ABSORB  FLUID 56X50 GRAY (MISCELLANEOUS) ×2
MAT ABSORB FLUID 56X50 GRAY (MISCELLANEOUS) ×2 IMPLANT
NS IRRIG 500ML POUR BTL (IV SOLUTION) ×3 IMPLANT
PACK HIP COMPR (MISCELLANEOUS) ×3 IMPLANT
PAD ARMBOARD 7.5X6 YLW CONV (MISCELLANEOUS) ×3 IMPLANT
SCREW BONE CORTICAL 5.0X42 (Screw) ×1 IMPLANT
SCREW CORTICL BON 5.0MM X 46MM (Screw) IMPLANT
STAPLER SKIN PROX 35W (STAPLE) ×3 IMPLANT
SUCTION FRAZIER HANDLE 10FR (MISCELLANEOUS) ×2
SUCTION TUBE FRAZIER 10FR DISP (MISCELLANEOUS) ×2 IMPLANT
SUT ETHILON 4 0 PS 2 18 (SUTURE) ×1 IMPLANT
SUT ETHILON NAB PS2 4-0 18IN (SUTURE) ×1 IMPLANT
SUT VIC AB 0 CT1 36 (SUTURE) ×6 IMPLANT
SUT VIC AB 2-0 CT1 27 (SUTURE) ×4
SUT VIC AB 2-0 CT1 TAPERPNT 27 (SUTURE) ×2 IMPLANT
SUT VICRYL 0 AB UR-6 (SUTURE) ×3 IMPLANT
SYR 30ML LL (SYRINGE) ×3 IMPLANT
WATER STERILE IRR 500ML POUR (IV SOLUTION) ×3 IMPLANT

## 2022-03-02 NOTE — Progress Notes (Signed)
Subjective:  POST OP CHECK s/p intramedullary fixation for left comminuted intertrochanteric hip fracture.   Patient reports left hip pain as mild at rest.  Patient is lying comfortably in her bed.  Her son is at the bedside.  Objective:   VITALS:   Vitals:   03/02/22 1615 03/02/22 1630 03/02/22 1645 03/02/22 1701  BP: 117/72 133/68 (!) 141/82 136/79  Pulse: 75 78 76 80  Resp: '19 20 18   '$ Temp:   97.6 F (36.4 C) 97.9 F (36.6 C)  TempSrc:      SpO2: 93% 91% 92% 94%  Weight:      Height:        PHYSICAL EXAM: Left lower extremity Neurovascular intact Sensation intact distally Intact pulses distally Dorsiflexion/Plantar flexion intact Incision: no drainage No cellulitis present Compartment soft  LABS  Results for orders placed or performed during the hospital encounter of 03/01/22 (from the past 24 hour(s))  CBC with Differential     Status: Abnormal   Collection Time: 03/01/22  9:57 PM  Result Value Ref Range   WBC 21.2 (H) 4.0 - 10.5 K/uL   RBC 4.57 3.87 - 5.11 MIL/uL   Hemoglobin 12.4 12.0 - 15.0 g/dL   HCT 38.6 36.0 - 46.0 %   MCV 84.5 80.0 - 100.0 fL   MCH 27.1 26.0 - 34.0 pg   MCHC 32.1 30.0 - 36.0 g/dL   RDW 17.2 (H) 11.5 - 15.5 %   Platelets 332 150 - 400 K/uL   nRBC 0.0 0.0 - 0.2 %   Neutrophils Relative % 89 %   Neutro Abs 18.7 (H) 1.7 - 7.7 K/uL   Lymphocytes Relative 5 %   Lymphs Abs 1.1 0.7 - 4.0 K/uL   Monocytes Relative 5 %   Monocytes Absolute 1.1 (H) 0.1 - 1.0 K/uL   Eosinophils Relative 0 %   Eosinophils Absolute 0.1 0.0 - 0.5 K/uL   Basophils Relative 0 %   Basophils Absolute 0.1 0.0 - 0.1 K/uL   Immature Granulocytes 1 %   Abs Immature Granulocytes 0.13 (H) 0.00 - 0.07 K/uL  Basic metabolic panel     Status: Abnormal   Collection Time: 03/01/22  9:57 PM  Result Value Ref Range   Sodium 139 135 - 145 mmol/L   Potassium 2.9 (L) 3.5 - 5.1 mmol/L   Chloride 90 (L) 98 - 111 mmol/L   CO2 32 22 - 32 mmol/L   Glucose, Bld 159 (H) 70 - 99  mg/dL   BUN 33 (H) 8 - 23 mg/dL   Creatinine, Ser 1.30 (H) 0.44 - 1.00 mg/dL   Calcium 9.9 8.9 - 10.3 mg/dL   GFR, Estimated 40 (L) >60 mL/min   Anion gap 17 (H) 5 - 15  CBC     Status: Abnormal   Collection Time: 03/02/22  3:20 AM  Result Value Ref Range   WBC 16.7 (H) 4.0 - 10.5 K/uL   RBC 3.88 3.87 - 5.11 MIL/uL   Hemoglobin 10.6 (L) 12.0 - 15.0 g/dL   HCT 32.4 (L) 36.0 - 46.0 %   MCV 83.5 80.0 - 100.0 fL   MCH 27.3 26.0 - 34.0 pg   MCHC 32.7 30.0 - 36.0 g/dL   RDW 17.0 (H) 11.5 - 15.5 %   Platelets 286 150 - 400 K/uL   nRBC 0.0 0.0 - 0.2 %  Comprehensive metabolic panel     Status: Abnormal   Collection Time: 03/02/22  3:20 AM  Result Value Ref Range  Sodium 138 135 - 145 mmol/L   Potassium 2.9 (L) 3.5 - 5.1 mmol/L   Chloride 93 (L) 98 - 111 mmol/L   CO2 32 22 - 32 mmol/L   Glucose, Bld 157 (H) 70 - 99 mg/dL   BUN 35 (H) 8 - 23 mg/dL   Creatinine, Ser 1.13 (H) 0.44 - 1.00 mg/dL   Calcium 9.0 8.9 - 10.3 mg/dL   Total Protein 6.4 (L) 6.5 - 8.1 g/dL   Albumin 3.6 3.5 - 5.0 g/dL   AST 25 15 - 41 U/L   ALT 19 0 - 44 U/L   Alkaline Phosphatase 90 38 - 126 U/L   Total Bilirubin 0.8 0.3 - 1.2 mg/dL   GFR, Estimated 48 (L) >60 mL/min   Anion gap 13 5 - 15  TSH     Status: None   Collection Time: 03/02/22  3:20 AM  Result Value Ref Range   TSH 2.243 0.350 - 4.500 uIU/mL  VITAMIN D 25 Hydroxy (Vit-D Deficiency, Fractures)     Status: None   Collection Time: 03/02/22  3:20 AM  Result Value Ref Range   Vit D, 25-Hydroxy 31.24 30 - 100 ng/mL  Vitamin B12     Status: Abnormal   Collection Time: 03/02/22  3:20 AM  Result Value Ref Range   Vitamin B-12 142 (L) 180 - 914 pg/mL  Glucose, capillary     Status: Abnormal   Collection Time: 03/02/22  7:59 AM  Result Value Ref Range   Glucose-Capillary 130 (H) 70 - 99 mg/dL  Glucose, capillary     Status: Abnormal   Collection Time: 03/02/22 12:19 PM  Result Value Ref Range   Glucose-Capillary 112 (H) 70 - 99 mg/dL  I-STAT,  chem 8     Status: Abnormal   Collection Time: 03/02/22 12:29 PM  Result Value Ref Range   Sodium 136 135 - 145 mmol/L   Potassium 3.1 (L) 3.5 - 5.1 mmol/L   Chloride 92 (L) 98 - 111 mmol/L   BUN 25 (H) 8 - 23 mg/dL   Creatinine, Ser 1.20 (H) 0.44 - 1.00 mg/dL   Glucose, Bld 114 (H) 70 - 99 mg/dL   Calcium, Ion 1.06 (L) 1.15 - 1.40 mmol/L   TCO2 32 22 - 32 mmol/L   Hemoglobin 10.5 (L) 12.0 - 15.0 g/dL   HCT 31.0 (L) 36.0 - 46.0 %  Potassium     Status: Abnormal   Collection Time: 03/02/22 12:46 PM  Result Value Ref Range   Potassium 3.2 (L) 3.5 - 5.1 mmol/L  Type and screen [place]     Status: None   Collection Time: 03/02/22 12:46 PM  Result Value Ref Range   ABO/RH(D) A NEG    Antibody Screen NEG    Sample Expiration      03/05/2022,2359 Performed at [facility], [address] [place], [place] [id number]   Glucose, capillary     Status: Abnormal   Collection Time: 03/02/22  3:52 PM  Result Value Ref Range   Glucose-Capillary 163 (H) 70 - 99 mg/dL   Comment 1 Notify RN    Comment 2 Document in Chart     DG FEMUR PORT MIN 2 VIEWS LEFT  Result Date: 03/02/2022 CLINICAL DATA:  Postoperative left hip ORIF EXAM: LEFT FEMUR PORTABLE 2 VIEWS COMPARISON:  03/02/2022 intraoperative images and preoperative radiographs from 03/01/2022 FINDINGS: There has been placement of an IM nail and left hip lag screw along with 2  distal interlocking screws for fixation of the left hip intertrochanteric fracture. Near anatomic alignment has been achieved. No new periprosthetic fracture or complicating feature is observed. IMPRESSION: 1. ORIF of the intertrochanteric fracture without complicating feature observed. Electronically Signed   By: Van Clines M.D.   On: 03/02/2022 16:44   DG HIP UNILAT WITH PELVIS 2-3 VIEWS LEFT  Result Date: 03/02/2022 CLINICAL DATA:  Left intertrochanteric femur fracture. EXAM: DG HIP (WITH OR WITHOUT PELVIS) 2-3V LEFT  COMPARISON:  Left hip x-rays from yesterday. FLUOROSCOPY TIME:  Radiation Exposure Index (as provided by the fluoroscopic device): 16.0 mGy Kerma C-arm fluoroscopic images were obtained intraoperatively and submitted for post operative interpretation. FINDINGS: Multiple intraoperative fluoroscopic images demonstrate interval cephalomedullary rod fixation of left intertrochanteric femur fracture, now in near anatomic alignment. IMPRESSION: 1. Intraoperative fluoroscopic guidance for left intertrochanteric femur fracture ORIF. Electronically Signed   By: Titus Dubin M.D.   On: 03/02/2022 15:14   DG C-Arm 1-60 Min-No Report  Result Date: 03/02/2022 Fluoroscopy was utilized by the requesting physician.  No radiographic interpretation.   DG C-Arm 1-60 Min-No Report  Result Date: 03/02/2022 Fluoroscopy was utilized by the requesting physician.  No radiographic interpretation.   DG Chest 1 View  Result Date: 03/01/2022 CLINICAL DATA:  Known left hip fracture EXAM: CHEST  1 VIEW COMPARISON:  02/26/2021 FINDINGS: Cardiac shadow is enlarged. Aortic calcifications are again noted. Lungs are well aerated with minimal left basilar scarring stable from the prior exam. No bony abnormality is seen. IMPRESSION: No acute abnormality noted. Electronically Signed   By: Inez Catalina M.D.   On: 03/01/2022 20:37   DG Hip Unilat With Pelvis 2-3 Views Left  Result Date: 03/01/2022 CLINICAL DATA:  Recent fall with left hip pain, initial encounter EXAM: DG HIP (WITH OR WITHOUT PELVIS) 3V LEFT COMPARISON:  02/26/2021 FINDINGS: Pelvic ring is intact. Comminuted intratrochanteric fracture is noted in the left femur with impaction and angulation at the fracture site. No soft tissue abnormality is seen. IMPRESSION: Comminuted left intratrochanteric fracture. Electronically Signed   By: Inez Catalina M.D.   On: 03/01/2022 20:37    Assessment/Plan: Day of Surgery   Principal Problem:   Closed hip fracture requiring operative  repair, left, initial encounter [facility]) Active Problems:   HTN (hypertension)   Hyperlipidemia associated with type 2 diabetes mellitus ([place])   Diabetes type 2, controlled ([place])   Vitamin B12 deficiency   Dementia ([place])   Hypokalemia   Lymphedema   Hypothyroidism   Vitamin D deficiency   Acute renal insufficiency  Patient is currently comfortable following surgery.  I have reviewed the postoperative x-rays which demonstrate the intertrochanteric hip fracture is well reduced and the anterior medullary hardware is well-positioned.  There is no evidence of postoperative complication.  Patient will have labs drawn in the a.m.  She will begin physical therapy tomorrow.  Patient will begin Lovenox for DVT prophylaxis tomorrow.  Patient complete 24 hours of postop antibiotics.    Thornton Park , MD 03/02/2022, 5:28 PM

## 2022-03-02 NOTE — Progress Notes (Signed)
  ORTHOPAEDIC CONSULTATION  Patient met in the pre-op area with 3 family members at the bedside. Patient had her left hip marked according to the hospital's correct site of surgery protocol.   I reviewed the details of the operation with the patient's family along with the post-op course.  I answered all their questions.  I discussed the risks and benefits of surgery. The risks include but are not limited to infection, bleeding requiring blood transfusion, nerve or blood vessel injury, joint stiffness or loss of motion, persistent pain, weakness or instability, malunion, nonunion, lower extremity shortening or change in rotation, hardware failure and the need for further surgery. Medical risks include but are not limited to DVT and pulmonary embolism, myocardial infarction, stroke, pneumonia, respiratory failure and death. They understood these risks and were in agreement with the plan for surgery.     Thornton Park, MD    03/02/2022 1:24 PM

## 2022-03-02 NOTE — Anesthesia Procedure Notes (Signed)
Procedure Name: Intubation Date/Time: 03/02/2022 1:39 PM  Performed by: Johnna Acosta, CRNAPre-anesthesia Checklist: Patient identified, Emergency Drugs available, Suction available, Patient being monitored and Timeout performed Patient Re-evaluated:Patient Re-evaluated prior to induction Oxygen Delivery Method: Circle system utilized Preoxygenation: Pre-oxygenation with 100% oxygen Induction Type: IV induction Ventilation: Two handed mask ventilation required and Mask ventilation with difficulty Laryngoscope Size: McGraph and 3 Grade View: Grade I Tube type: Oral Tube size: 7.0 mm Number of attempts: 1 Airway Equipment and Method: Stylet and Video-laryngoscopy Placement Confirmation: ETT inserted through vocal cords under direct vision, positive ETCO2 and breath sounds checked- equal and bilateral Secured at: 21 cm Tube secured with: Tape Dental Injury: Teeth and Oropharynx as per pre-operative assessment

## 2022-03-02 NOTE — Transfer of Care (Signed)
Immediate Anesthesia Transfer of Care Note  Patient: Hannah Duran  Procedure(s) Performed: INTRAMEDULLARY (IM) NAIL INTERTROCHANTRIC (Left)  Patient Location: PACU  Anesthesia Type:General  Level of Consciousness: drowsy  Airway & Oxygen Therapy: Patient Spontanous Breathing and Patient connected to face mask oxygen  Post-op Assessment: Report given to RN and Post -op Vital signs reviewed and stable  Post vital signs: Reviewed and stable  Last Vitals:  Vitals Value Taken Time  BP 111/57 03/02/22 1545  Temp 36.7 C 03/02/22 1545  Pulse 76 03/02/22 1551  Resp 19 03/02/22 1551  SpO2 100 % 03/02/22 1551  Vitals shown include unvalidated device data.  Last Pain:  Vitals:   03/02/22 1244  TempSrc: Oral  PainSc:          Complications: No notable events documented.

## 2022-03-02 NOTE — Progress Notes (Addendum)
Waiting on Dr. Harden Mo reply to verify about Ancef order since patient is allergic to [facility], sulfa and amoxillin. Patient was transported to OR.

## 2022-03-02 NOTE — Consult Note (Signed)
ORTHOPAEDIC CONSULTATION  REQUESTING PHYSICIAN: Fritzi Mandes, MD  Chief Complaint: Left hip pain status post fall  HPI: Hannah Duran is a 88 y.o. female who complains of left hip pain after a fall yesterday while leaning down to pick up a paper towel while using her walker.  She lost her balance and fell.  She was brought to [facility] emergency department where x-rays revealed a displaced intertrochanteric left hip fracture.  Patient is seen in the hospital room this morning and denies any significant left hip pain at rest.  She denies any numbness or tingling.  She denies other injuries.  Past Medical History:  Diagnosis Date   Anxiety    Congenital foot deformity    Dermatophytosis    Hallucinations    HLD (hyperlipidemia)    HTN (hypertension)    Hyperglycemia    Irregular heart rhythm    pt unsure of what type; followed by PCP only   Ischemic colitis ([place]) 7/13   on colonoscopy   Memory loss    Obesity    Rotator cuff syndrome    Past Surgical History:  Procedure Laterality Date   CATARACT EXTRACTION W/PHACO Right 09/09/2016   Procedure: CATARACT EXTRACTION PHACO AND INTRAOCULAR LENS PLACEMENT ([place]);  Surgeon: Leandrew Koyanagi, MD;  Location: [place];  Service: Ophthalmology;  Laterality: Right;  right   CATARACT EXTRACTION W/PHACO Left 12/16/2016   Procedure: CATARACT EXTRACTION PHACO AND INTRAOCULAR LENS PLACEMENT ([place])  Left;  Surgeon: Leandrew Koyanagi, MD;  Location: [place];  Service: Ophthalmology;  Laterality: Left;   HUMERUS FRACTURE SURGERY  10/1997   TOTAL ABDOMINAL HYSTERECTOMY  1980's   due to menometrorrhagia   Social History   Socioeconomic History   Marital status: Widowed    Spouse name: Not on file   Number of children: 2   Years of education: 12   Highest education level: High school graduate  Occupational History   Occupation: retired Network engineer  Tobacco Use   Smoking status: Never   Smokeless tobacco: Never   Vaping Use   Vaping Use: Never used  Substance and Sexual Activity   Alcohol use: No   Drug use: No   Sexual activity: Never  Other Topics Concern   Not on file  Social History Narrative   Lives at home alone.   Right-handed.   One cup coffee per day.   Social Determinants of Health   Financial Resource Strain: Low Risk  (05/29/2019)   Overall Financial Resource Strain (CARDIA)    Difficulty of Paying Living Expenses: Not hard at all  Food Insecurity: No Food Insecurity (05/29/2019)   Hunger Vital Sign    Worried About Running Out of Food in the Last Year: Never true    Ran Out of Food in the Last Year: Never true  Transportation Needs: No Transportation Needs (05/29/2019)   PRAPARE - Hydrologist (Medical): No    Lack of Transportation (Non-Medical): No  Physical Activity: Inactive (05/29/2019)   Exercise Vital Sign    Days of Exercise per Week: 0 days    Minutes of Exercise per Session: 0 min  Stress: No Stress Concern Present (05/29/2019)   [place]    Feeling of Stress : Not at all  Social Connections: Not on file   Family History  Problem Relation Age of Onset   Stroke Mother    Parkinsonism Mother    Hypertension Mother  Alzheimer's disease Mother    Cancer Father        bladder/prostate   Breast cancer Neg Hx    Allergies  Allergen Reactions   Penicillins     REACTION: Rash   Sulfamethoxazole-Trimethoprim     rash   Amoxicillin Rash   Prior to Admission medications   Medication Sig Start Date End Date Taking? Authorizing Provider  albuterol (VENTOLIN HFA) 108 (90 Base) MCG/ACT inhaler Inhale 2 puffs into the lungs every 4 (four) hours as needed for wheezing. 04/29/20  Yes Tower, Wynelle Fanny, MD  atorvastatin (LIPITOR) 20 MG tablet Take 1 tablet (20 mg total) by mouth daily. 09/19/20  Yes Tower, Wynelle Fanny, MD  furosemide (LASIX) 20 MG tablet Take 1 tablet (20 mg total)  by mouth daily. 10/24/20  Yes Tower, Wynelle Fanny, MD  losartan (COZAAR) 25 MG tablet Take 1 tablet (25 mg total) by mouth daily. 09/19/20  Yes Tower, Wynelle Fanny, MD  metFORMIN (GLUCOPHAGE) 500 MG tablet Take 1 tablet (500 mg total) by mouth 2 (two) times daily with a meal. 09/19/20  Yes Tower, Wynelle Fanny, MD  metolazone (ZAROXOLYN) 2.5 MG tablet Take 2.5 mg by mouth daily. 02/26/22  Yes [provider]  NYSTATIN powder SMARTSIG:1 Application Topical 2-3 Times Daily 05/15/21  Yes [provider]  potassium chloride (KLOR-CON) 10 MEQ tablet Take 1 tablet (10 mEq total) by mouth daily. Take one pill by mouth once weekly when you take your furosemide 09/19/20  Yes Tower, Wynelle Fanny, MD  QUEtiapine (SEROQUEL) 25 MG tablet Take 1 tablet (25 mg total) by mouth at bedtime. 01/18/20  Yes Suzzanne Cloud, NP  torsemide (DEMADEX) 20 MG tablet Take 20 mg by mouth 2 (two) times daily. 01/30/22  Yes [provider]   DG Chest 1 View  Result Date: 03/01/2022 CLINICAL DATA:  Known left hip fracture EXAM: CHEST  1 VIEW COMPARISON:  02/26/2021 FINDINGS: Cardiac shadow is enlarged. Aortic calcifications are again noted. Lungs are well aerated with minimal left basilar scarring stable from the prior exam. No bony abnormality is seen. IMPRESSION: No acute abnormality noted. Electronically Signed   By: Inez Catalina M.D.   On: 03/01/2022 20:37   DG Hip Unilat With Pelvis 2-3 Views Left  Result Date: 03/01/2022 CLINICAL DATA:  Recent fall with left hip pain, initial encounter EXAM: DG HIP (WITH OR WITHOUT PELVIS) 3V LEFT COMPARISON:  02/26/2021 FINDINGS: Pelvic ring is intact. Comminuted intratrochanteric fracture is noted in the left femur with impaction and angulation at the fracture site. No soft tissue abnormality is seen. IMPRESSION: Comminuted left intratrochanteric fracture. Electronically Signed   By: Inez Catalina M.D.   On: 03/01/2022 20:37    Positive ROS: All other systems have been reviewed and were otherwise  negative with the exception of those mentioned in the HPI and as above.  Physical Exam: General: Alert, no acute distress  MUSCULOSKELETAL: Left lower extremity: Patient skin is intact.  There is no erythema ecchymosis or significant swelling.  Patient's thigh compartments are soft and compressible.  Patient distally is neurovascular intact.  She has shortening and external rotation of the left lower extremity.  Assessment: Displaced left intertrochanteric hip fracture  Plan: I explained the nature of the fracture to the patient and her son by phone.  I have recommended intramedullary fixation for this fracture.  Patient has been n.p.o. and the plan is for surgery this afternoon.  Patient is hypokalemic but is currently having her potassium repleted.  Patient  and her son understood and agreed with this plan.   Thornton Park, MD    03/02/2022 9:16 AM

## 2022-03-02 NOTE — Progress Notes (Signed)
  Progress Note   Patient: Hannah Duran WCH:[id number] DOB: 1934/01/30 DOA: 03/01/2022     1 DOS: the patient was seen and examined on 03/02/2022   Brief hospital course: 88 year old female with history of T2DM, HTN, HLD, B12 deficiency, vitamin D deficiency, low thyroid, dementia who has a left intertrochanteric fracture going for surgery in the a.m.  Assessment and Plan: * Closed hip fracture requiring operative repair, left, initial encounter [facility]) --s/p mechanical fall at [facility] --Seen  By Dr Adriana Simas surgery today --N.p.o. after midnight --No blood thinners  Hypokalemia Replete and trend  Acute renal insufficiency Unclear etiology though appears consistent with dehydration. We will give gentle IV hydration Avoid nephrotoxic agents Trend  Vitamin D deficiency Check level  Hypothyroidism Check levels  Lymphedema Holding Zaroxolyn and Demadex  Dementia ([place]) Continue Seroquel  Vitamin B12 deficiency Check level  Diabetes type 2, controlled ([place]) --Continue metformin --Sliding-scale insulin  Hyperlipidemia associated with type 2 diabetes mellitus ([place]) --Continue Lipitor  HTN (hypertension) --Continue losartan        Subjective: no new complaints Dter at bedside Physical Exam: Vitals:   03/01/22 2316 03/02/22 0500 03/02/22 0903 03/02/22 1244  BP: 123/60 118/66 (!) 102/48 (!) 109/45  Pulse: (!) 103 96 98   Resp: '18 18 18 20  '$ Temp: 97.7 F (36.5 C) 97.8 F (36.6 C) 98 F (36.7 C) 99.6 F (37.6 C)  TempSrc: Oral Oral  Oral  SpO2: 99% 99% 92% 98%  Weight:      Height:      Physical Examination: General appearance - alert, well appearing, and in no distress and overweight Chest - clear to auscultation, no wheezes, rales or rhonchi,  Heart - normal rate, regular rhythm, normal S1, S2, no murmurs,  Abdomen - soft, nontender, nondistended, no masses or organomegaly Neurological -patient is oriented to self, time.  She mostly answers  questions independently but clearly has a few deficits Extremities - pedal edema 2-3+--chronic Family Communication: dter at bedside  Disposition: Status is: Inpatient Remains inpatient appropriate because: hip fracture  Planned Discharge Destination:  TBD    Time spent: 35 minutes  Author: Fritzi Mandes, MD 03/02/2022 3:11 PM  For on call review [email or web address].

## 2022-03-02 NOTE — Op Note (Signed)
03/02/2022  4:10 PM  PATIENT:  Hannah Duran    PRE-OPERATIVE DIAGNOSIS: Comminuted left intertrochanteric hip Fracture  POST-OPERATIVE DIAGNOSIS:  Same  PROCEDURE:  INTRAMEDULLARY fixation of left comminuted intertrochanteric hip fracture  SURGEON:  Thornton Park, MD  ANESTHESIA:   general  EBL:  100 cc  IMPLANT:  ZIMMER BIOMET AFFIXUS NAIL 56m x 3651mwith a 11534mag screw and distal interlocking screws 42 mm  and 46 mm in length.  PREOPERATIVE INDICATIONS:  Hannah Duran a  87 38o. female with a diagnosis of a comminuted left intertrochanteric hip fracture after a fall.  Given the displacement it was recommended the patient undergo intramedullary fixation for her fracture.  The risks, benefits and alternatives were discussed with the patient and their family.  The risks include but are not limited to infection, bleeding requiring blood transfusion, nerve or blood vessel injury, malunion, nonunion, hardware prominence, hardware failure, leg length discrepancy or change in lower extremity rotation and need for further surgery including hardware removal with conversion to a total hip arthroplasty. Medical risks include but are not limited to DVT and pulmonary embolism, myocardial infarction, stroke, pneumonia, respiratory failure and death. The patient and her family understood these risks and wished to proceed with surgery.  OPERATIVE PROCEDURE:  The patient was brought to the operating room and placed in the supine position on the fracture table. The patient received general anesthesia after failed attempts at a spinal.  A closed reduction was performed under C-arm guidance.  The fracture reduction was confirmed on both AP and lateral views. After adequate reduction was achieved, a time out was performed to verify the patient's name, date of birth, medical record number, correct site of surgery correct procedure to be performed. The timeout was also used to verify the patient  received antibiotics and all appropriate instruments, implants and radiographic studies were available in the room. Once all in attendance were in agreement, the case began. The patient was prepped and draped in a sterile fashion.  She received preoperative antibiotics with 2 g of Ancef IV.  Despite a penicillin and amoxicillin allergies, she had no adverse reactions from the Ancef.  An incision was made proximal to the greater trochanter in line with the femur. A guidewire was placed over the tip of the greater trochanter and advanced by drill into the proximal femur to the level of the lesser trochanter.  Confirmation of the drill pin position was made on AP and lateral C-arm images.  The threaded guidepin was then overdrilled with the proximal femoral entry reamer.  A ball-tipped guidewire was then advanced down the intramedullary canal, across the fracture, and down the femoral shaft to the knee.  The ball tip guidewire's position was confirmed at both the knee and hip via C-arm imaging. A depth gauge was used to measure the length of the long nail to be used. It was measured to be 360 mm. The actual nail was then inserted into the proximal femur, across the fracture site and down the femoral shaft. Its position was confirmed on AP and lateral C-arm images.  The ball tip guidewire was removed.  Once the nail was completely seated, the drill guide for the lag screw was placed through the guide arm for the Affixus nail. A guidepin was then placed through this drill guide and advanced through the lateral cortex of the femur, across the fracture site and into the femoral head achieving a tip apex distance of less than 25 mm.  The length of the drill pin was measured 115 mm, and then the drill for the lag screw was advanced through the lateral cortex, across the fracture site and up into the femoral head to the depth of the lag screw..  The lag screw was then advanced by hand into position across the fracture site  into the femoral head. Its final position was confirmed on AP and lateral C-arm images. Compression was applied as traction was carefully released. The set screw in the top of the intramedullary rod was tightened by hand using a screwdriver. It was backed off a quarter turn to allow for compression at the fracture site.  The attention was then turned to placement of the distal interlocking screws. A perfect circle technique was used. 2 small stab incisions were made over the distal interlocking screw holes.  A free hand technique was used to drill both distal interlocking screws. The depth of the screw holes was measured with a depth gauge. The 42 mm and 46 mm screws were then advanced into position and tightened by hand. Final C-arm images of the entire intramedullary construct were taken in both the AP and lateral planes.   The wounds were irrigated copiously and closed with 0 Vicryl for closure of the deep fascia and deep subcutaneous tissue and 2-0 Vicryl for superficial subcutaneous closure. The skin was approximated with staples. A dry sterile dressing was applied. I was scrubbed and present the entire case and all sharp, sponge and instrument counts were correct at the conclusion of the case. Patient was transferred to a hospital bed and brought to PACU in stable condition.     Timoteo Gaul, MD

## 2022-03-02 NOTE — Anesthesia Preprocedure Evaluation (Addendum)
Anesthesia Evaluation  Patient identified by MRN, date of birth, ID band Patient awake and Patient confused    Reviewed: Allergy & Precautions, NPO status , Patient's Chart, lab work & pertinent test results  Airway Mallampati: III  TM Distance: >3 FB Neck ROM: full    Dental  (+) Poor Dentition   Pulmonary neg pulmonary ROS, asthma ,    Pulmonary exam normal breath sounds clear to auscultation       Cardiovascular Exercise Tolerance: Poor hypertension, Pt. on medications negative cardio ROS Normal cardiovascular exam+ dysrhythmias  Rhythm:Regular     Neuro/Psych Anxiety Dementia Hx of parkinson's disease negative neurological ROS  negative psych ROS   GI/Hepatic negative GI ROS, Neg liver ROS,   Endo/Other  negative endocrine ROSdiabetes, Type 2Hyperthyroidism Morbid obesity  Renal/GU      Musculoskeletal negative musculoskeletal ROS (+)   Abdominal (+) + obese,   Peds  Hematology negative hematology ROS (+)   Anesthesia Other Findings Past Medical History: No date: Anxiety No date: Congenital foot deformity No date: Dermatophytosis No date: Hallucinations No date: HLD (hyperlipidemia) No date: HTN (hypertension) No date: Hyperglycemia No date: Irregular heart rhythm     Comment:  pt unsure of what type; followed by PCP only 7/13: Ischemic colitis ([place])     Comment:  on colonoscopy No date: Memory loss No date: Obesity No date: Rotator cuff syndrome  Past Surgical History: 09/09/2016: CATARACT EXTRACTION W/PHACO; Right     Comment:  Procedure: CATARACT EXTRACTION PHACO AND INTRAOCULAR               LENS PLACEMENT ([place]);  Surgeon: Leandrew Koyanagi, MD;               Location: [facility];  Service: Ophthalmology;                Laterality: Right;  right 12/16/2016: CATARACT EXTRACTION W/PHACO; Left     Comment:  Procedure: CATARACT EXTRACTION PHACO AND INTRAOCULAR               LENS  PLACEMENT ([place])  Left;  Surgeon: Leandrew Koyanagi, MD;  Location: [facility];  Service:              Ophthalmology;  Laterality: Left; 10/1997: HUMERUS FRACTURE SURGERY 1980's: TOTAL ABDOMINAL HYSTERECTOMY     Comment:  due to menometrorrhagia  BMI    Body Mass Index: 37.59 kg/m      Reproductive/Obstetrics negative OB ROS                            Anesthesia Physical Anesthesia Plan  ASA: 3  Anesthesia Plan: Spinal and General   Post-op Pain Management:    Induction: Intravenous  PONV Risk Score and Plan: Ondansetron, Dexamethasone, Midazolam and Treatment may vary due to age or medical condition  Airway Management Planned: Natural Airway and Oral ETT  Additional Equipment:   Intra-op Plan:   Post-operative Plan: Extubation in OR  Informed Consent: I have reviewed the patients History and Physical, chart, labs and discussed the procedure including the risks, benefits and alternatives for the proposed anesthesia with the patient or authorized representative who has indicated his/her understanding and acceptance.     Dental Advisory Given  Plan Discussed with: CRNA and Surgeon  Anesthesia Plan Comments:         Anesthesia Quick Evaluation

## 2022-03-03 DIAGNOSIS — S72002A Fracture of unspecified part of neck of left femur, initial encounter for closed fracture: Secondary | ICD-10-CM | POA: Diagnosis not present

## 2022-03-03 LAB — GLUCOSE, CAPILLARY
Glucose-Capillary: 111 mg/dL — ABNORMAL HIGH (ref 70–99)
Glucose-Capillary: 164 mg/dL — ABNORMAL HIGH (ref 70–99)
Glucose-Capillary: 165 mg/dL — ABNORMAL HIGH (ref 70–99)
Glucose-Capillary: 172 mg/dL — ABNORMAL HIGH (ref 70–99)

## 2022-03-03 LAB — CBC
HCT: 29 % — ABNORMAL LOW (ref 36.0–46.0)
Hemoglobin: 9.4 g/dL — ABNORMAL LOW (ref 12.0–15.0)
MCH: 27.2 pg (ref 26.0–34.0)
MCHC: 32.4 g/dL (ref 30.0–36.0)
MCV: 83.8 fL (ref 80.0–100.0)
Platelets: 251 10*3/uL (ref 150–400)
RBC: 3.46 MIL/uL — ABNORMAL LOW (ref 3.87–5.11)
RDW: 17.2 % — ABNORMAL HIGH (ref 11.5–15.5)
WBC: 14.3 10*3/uL — ABNORMAL HIGH (ref 4.0–10.5)
nRBC: 0 % (ref 0.0–0.2)

## 2022-03-03 LAB — BASIC METABOLIC PANEL
Anion gap: 9 (ref 5–15)
BUN: 27 mg/dL — ABNORMAL HIGH (ref 8–23)
CO2: 28 mmol/L (ref 22–32)
Calcium: 8.1 mg/dL — ABNORMAL LOW (ref 8.9–10.3)
Chloride: 98 mmol/L (ref 98–111)
Creatinine, Ser: 1.11 mg/dL — ABNORMAL HIGH (ref 0.44–1.00)
GFR, Estimated: 49 mL/min — ABNORMAL LOW (ref >60)
Glucose, Bld: 156 mg/dL — ABNORMAL HIGH (ref 70–99)
Potassium: 3.3 mmol/L — ABNORMAL LOW (ref 3.5–5.1)
Sodium: 135 mmol/L (ref 135–145)

## 2022-03-03 LAB — HEMOGLOBIN A1C
Hgb A1c MFr Bld: 6.1 % — ABNORMAL HIGH (ref 4.8–5.6)
Mean Plasma Glucose: 128 mg/dL

## 2022-03-03 MED ORDER — ACETAMINOPHEN 10 MG/ML IV SOLN
1000.0000 mg | Freq: Once | INTRAVENOUS | Status: AC
  Administered 2022-03-03: 1000 mg via INTRAVENOUS
  Filled 2022-03-03: qty 100

## 2022-03-03 MED ORDER — POTASSIUM CHLORIDE 20 MEQ PO PACK
20.0000 meq | PACK | Freq: Three times a day (TID) | ORAL | Status: AC
  Administered 2022-03-03 – 2022-03-04 (×3): 20 meq via ORAL
  Filled 2022-03-03 (×3): qty 1

## 2022-03-03 NOTE — Progress Notes (Signed)
       CROSS COVER NOTE  NAME: Hannah Duran MRN: [id number] DOB : 1933-09-19    Date of Service   03/03/22  HPI/Events of Note   Secure chat received from nursing "Is there a way to change [name]'s Meds to IV? I'm most concerned about her potassium. It was 3.2 earlier today and she was unable to swallow her pills after 10 pm this evening. She could tell me who and where she was and why she was in the hospital but she was holding the pills in her mouth and would not swallow them"  Interventions   Plan: Dissolve meds in water/crush in applesauce IV Tylenol x1 AM BMP now Consider transitioning PO meds as able if patient remains unable to take PO meds in AM once fully awake      This document was prepared using Dragon voice recognition software and may include unintentional dictation errors.  Neomia Glass DNP, MHA, FNP-BC Nurse Practitioner [facility] Pager [phone]

## 2022-03-03 NOTE — Evaluation (Signed)
Occupational Therapy Evaluation Patient Details Name: Hannah Duran MRN: [id number] DOB: June 10, 1934 Today's Date: 03/03/2022   History of Present Illness Per chart, Hannah Duran is a 88 y.o. female with medical history significant of T2DM, HLD, HTN, hypothyroid, dementia, lymphedema, B12 deficiency, vitamin D deficiency who is from an assisted living facility where she was bending over today with her walker to pick up a paper towel and fell on her left side.  She was found to have a left intertrochanteric fracture requiring surgical repair.   S/p L hip IM nailing on 03/02/22.   Clinical Impression   Pt seen for OT evaluation this date.  Pt comes from [facility].  Prior to fall, pt required assist with all ADLs d/t hx of dementia, but was ambulatory within facility with RW, per son who was present for eval.  Pt is s/p L hip IM nailing and is now WBAT to the LLE as of 03/02/22 at 1702.  Pt requiring max A for bed mobility, 2 person mod A sit to stand.  Had planned transfer to [facility] but pt had UI episode upon standing, so was sat back down to assist with clothing and bedding change with the help of NT.  Total assist for LB care at this time, see note for details.  Pt will benefit from SNF at d/c to maximize indep with ADLs and functional mobility.  Will continue to follow in the acute setting for the same.  Left in bed with call light and all necessary items within reach, bed alarm set.      Recommendations for follow up therapy are one component of a multi-disciplinary discharge planning process, led by the attending physician.  Recommendations may be updated based on patient status, additional functional criteria and insurance authorization.   Follow Up Recommendations  Skilled nursing-short term rehab (<3 hours/day)    Assistance Recommended at Discharge Frequent or constant Supervision/Assistance  Patient can return home with the following Assistance with cooking/housework;Direct  supervision/assist for financial management;A lot of help with walking and/or transfers;A lot of help with bathing/dressing/bathroom    Functional Status Assessment  Patient has had a recent decline in their functional status and demonstrates the ability to make significant improvements in function in a reasonable and predictable amount of time.  Equipment Recommendations  Other (comment) (Defer to next venue of care)    Recommendations for Other Services       Precautions / Restrictions Precautions Precautions: Fall Restrictions Weight Bearing Restrictions: Yes LLE Weight Bearing: Weight bearing as tolerated Other Position/Activity Restrictions: WBAT as of 03/02/22 at 1702.      Mobility Bed Mobility Overal bed mobility: Needs Assistance Bed Mobility: Rolling, Supine to Sit, Sit to Supine Rolling: Max assist, +2 for physical assistance   Supine to sit: Max assist, HOB elevated Sit to supine: Max assist, +2 for physical assistance   General bed mobility comments: extensive verbal, tactile, and then hand over hand cues to maintain hold of bed rail while rolling d/t pt fearful of rolling to either side.  Pt pushes against the direction she's rolling toward. Patient Response: Cooperative  Transfers Overall transfer level: Needs assistance Equipment used: Rolling walker (2 wheels) Transfers: Sit to/from Stand Sit to Stand: Mod assist, +2 physical assistance           General transfer comment: max vc and tactile cues for forward lean to initiate standing.  Pt prefers to rest EOB in slightly reclined position leaning on an arm behind her,  but can pull herself back up to midline sitting without assist.      Balance Overall balance assessment: Needs assistance Sitting-balance support: Bilateral upper extremity supported, Feet supported Sitting balance-Leahy Scale: Fair     Standing balance support: During functional activity, Reliant on assistive device for balance Standing  balance-Leahy Scale: Poor                             ADL either performed or assessed with clinical judgement   ADL Overall ADL's : Needs assistance/impaired Eating/Feeding: Independent                   Lower Body Dressing: Total assistance;Bed level Lower Body Dressing Details (indicate cue type and reason): complete LB clothing change at bed level d/t UI upon standing and pt did not have purewick in room at the time.     Toileting- Clothing Manipulation and Hygiene: Total assistance Toileting - Clothing Manipulation Details (indicate cue type and reason): had planned to attempt voiding at [facility], but UI upon standing.  Total assist for hygiene and clothing change.     Functional mobility during ADLs: +2 for physical assistance;Rolling walker (2 wheels) General ADL Comments: Pt able to perform sit to stand at beside with mod A x2.  Upon standing, pt had UI so was sat back down to wipe floor and assist with hygiene and clothing change.     Vision Patient Visual Report: No change from baseline                  Pertinent Vitals/Pain Pain Assessment Pain Assessment: 0-10 Pain Score: 5  Pain Location: L hip Pain Descriptors / Indicators: Aching, Sore Pain Intervention(s): Limited activity within patient's tolerance, Monitored during session, Repositioned     Hand Dominance     Extremity/Trunk Assessment Upper Extremity Assessment Upper Extremity Assessment: Overall WFL for tasks assessed   Lower Extremity Assessment Lower Extremity Assessment: Defer to PT evaluation;LLE deficits/detail LLE Deficits / Details: L hip fx   Cervical / Trunk Assessment Cervical / Trunk Assessment: Normal   Communication Communication Communication: No difficulties   Cognition Arousal/Alertness: Awake/alert Behavior During Therapy: WFL for tasks assessed/performed Overall Cognitive Status: History of cognitive impairments - at baseline                                  General Comments: hx of dementia; pt A&O x4, including year, month, but pleasantly confused with staff confusing staff with people she previously knew and poor historian as to PLOF.     General Comments  2 incision sites at L hip intact with dressings.    Exercises Other Exercises Other Exercises: Role of OT, goals   Shoulder Instructions      Home Living Family/patient expects to be discharged to:: Assisted living                             Home Equipment: Rolling Walker (2 wheels)   Additional Comments: Resides at [facility]      Prior Functioning/Environment Prior Level of Function : Needs assist  Cognitive Assist : Mobility (cognitive);ADLs (cognitive) Mobility (Cognitive): Set up cues ADLs (Cognitive): Step by step cues Physical Assist : ADLs (physical)   ADLs (physical): Bathing;Dressing;IADLs Mobility Comments: Per son, pt was ambulatory within ALF with RW without supv prior  to this fall ADLs Comments: assist with all ADLs at baseline d/t dementia.        OT Problem List: Decreased strength;Decreased knowledge of use of DME or AE;Decreased range of motion;Decreased activity tolerance;Decreased cognition;Impaired balance (sitting and/or standing);Decreased safety awareness;Pain      OT Treatment/Interventions: Self-care/ADL training;Therapeutic exercise;Patient/family education;Balance training;Therapeutic activities;DME and/or AE instruction    OT Goals(Current goals can be found in the care plan section) Acute Rehab OT Goals Patient Stated Goal: Feel better OT Goal Formulation: With patient Time For Goal Achievement: 03/17/22 Potential to Achieve Goals: Good  OT Frequency: Min 2X/week                  AM-PAC OT "6 Clicks" Daily Activity     Outcome Measure Help from another person eating meals?: None Help from another person taking care of personal grooming?: A Little Help from another person toileting, which includes  using toliet, bedpan, or urinal?: Total Help from another person bathing (including washing, rinsing, drying)?: A Lot Help from another person to put on and taking off regular upper body clothing?: A Lot Help from another person to put on and taking off regular lower body clothing?: Total 6 Click Score: 13   End of Session Equipment Utilized During Treatment: Gait belt;Rolling walker (2 wheels) Nurse Communication: Other (comment) (NT assisted with 2 person transfer, bed mobility, and LB dressing change, so aware of status)  Activity Tolerance: Patient tolerated treatment well Patient left: in bed;with call bell/phone within reach;with bed alarm set  OT Visit Diagnosis: Other abnormalities of gait and mobility (R26.89);Muscle weakness (generalized) (M62.81);History of falling (Z91.81)                Time: 7680-8811 OT Time Calculation (min): 40 min Charges:  OT General Charges $OT Visit: 1 Visit OT Treatments $Self Care/Home Management : 8-22 mins  Leta Speller, MS, OTR/L   [name] 03/03/2022, 3:19 PM

## 2022-03-03 NOTE — Progress Notes (Signed)
  Progress Note   Patient: Hannah Duran CWU:[id number] DOB: 1934-03-08 DOA: 03/01/2022     2 DOS: the patient was seen and examined on 03/03/2022   Brief hospital course: 88 year old female with history of T2DM, HTN, HLD, B12 deficiency, vitamin D deficiency, low thyroid, dementia who has a left intertrochanteric fracture s/p mechanical fall at  her facility  Assessment and Plan:  Closed hip fracture requiring operative repair, left, initial encounter [facility]) --s/p mechanical fall at [facility] --Seen  By Dr Adriana Simas surgery today --N.p.o. after midnight --No blood thinners   Hypokalemia Replete and trend   Acute renal insufficiency Unclear etiology though appears consistent with dehydration. We will give gentle IV hydration Avoid nephrotoxic agents Trend   Vitamin D deficiency Check level   Hypothyroidism Check levels   Lymphedema Holding Zaroxolyn and Demadex   Dementia ([place]) Continue Seroquel   Vitamin B12 deficiency Check level   Diabetes type 2, controlled ([place]) --Continue metformin --Sliding-scale insulin   Hyperlipidemia associated with type 2 diabetes mellitus ([place]) --Continue Lipitor   HTN (hypertension) --Continue losartan   Subjective: no new complaints Dter at bedside Physical Exam: Vitals:   03/02/22 1941 03/03/22 0559 03/03/22 0843 03/03/22 1110  BP: (!) 121/48 (!) 117/55 (!) 106/53 118/62  Pulse: 98 79 74 77  Resp: '17 17 18 18  '$ Temp: 98.7 F (37.1 C) 98 F (36.7 C) 97.9 F (36.6 C) 97.9 F (36.6 C)  TempSrc:      SpO2: 96% 92% 94% 95%  Weight:      Height:      Physical Examination: General appearance - alert, well appearing, and in no distress and overweight Chest - clear to auscultation, no wheezes, rales or rhonchi,  Heart - normal rate, regular rhythm, normal S1, S2, no murmurs,  Abdomen - soft, nontender, nondistended, no masses or organomegaly Neurological -patient is oriented to self, time.  She mostly answers questions  independently but clearly has a few deficits Extremities - pedal edema 2-3+--chronic Family Communication: dter in law at bedside  Disposition: Status is: Inpatient Remains inpatient appropriate because: hip fracture  Planned Discharge Destination:  TBD    Time spent: 35 minutes  Author: Fritzi Mandes, MD 03/03/2022 1:34 PM  For on call review [email or web address].

## 2022-03-03 NOTE — Progress Notes (Signed)
Subjective:  Patient reports pain as mild.  Sitting up in bed.  Objective:   VITALS:   Vitals:   03/02/22 1645 03/02/22 1701 03/02/22 1941 03/03/22 0559  BP: (!) 141/82 136/79 (!) 121/48 (!) 117/55  Pulse: 76 80 98 79  Resp: '18  17 17  '$ Temp: 97.6 F (36.4 C) 97.9 F (36.6 C) 98.7 F (37.1 C) 98 F (36.7 C)  TempSrc:      SpO2: 92% 94% 96% 92%  Weight:      Height:        PHYSICAL EXAM:  ABD soft Sensation intact distally Dorsiflexion/Plantar flexion intact Incision: dressing C/D/I No cellulitis present Compartment soft  LABS  Results for orders placed or performed during the hospital encounter of 03/01/22 (from the past 24 hour(s))  Glucose, capillary     Status: Abnormal   Collection Time: 03/02/22  7:59 AM  Result Value Ref Range   Glucose-Capillary 130 (H) 70 - 99 mg/dL  Glucose, capillary     Status: Abnormal   Collection Time: 03/02/22 12:19 PM  Result Value Ref Range   Glucose-Capillary 112 (H) 70 - 99 mg/dL  I-STAT, chem 8     Status: Abnormal   Collection Time: 03/02/22 12:29 PM  Result Value Ref Range   Sodium 136 135 - 145 mmol/L   Potassium 3.1 (L) 3.5 - 5.1 mmol/L   Chloride 92 (L) 98 - 111 mmol/L   BUN 25 (H) 8 - 23 mg/dL   Creatinine, Ser 1.20 (H) 0.44 - 1.00 mg/dL   Glucose, Bld 114 (H) 70 - 99 mg/dL   Calcium, Ion 1.06 (L) 1.15 - 1.40 mmol/L   TCO2 32 22 - 32 mmol/L   Hemoglobin 10.5 (L) 12.0 - 15.0 g/dL   HCT 31.0 (L) 36.0 - 46.0 %  Potassium     Status: Abnormal   Collection Time: 03/02/22 12:46 PM  Result Value Ref Range   Potassium 3.2 (L) 3.5 - 5.1 mmol/L  Type and screen [facility]     Status: None   Collection Time: 03/02/22 12:46 PM  Result Value Ref Range   ABO/RH(D) A NEG    Antibody Screen NEG    Sample Expiration      03/05/2022,2359 Performed at [facility], [address] [place], [place] [address]   Glucose, capillary     Status: Abnormal   Collection Time: 03/02/22  3:52 PM   Result Value Ref Range   Glucose-Capillary 163 (H) 70 - 99 mg/dL   Comment 1 Notify RN    Comment 2 Document in Chart   Glucose, capillary     Status: Abnormal   Collection Time: 03/02/22  5:42 PM  Result Value Ref Range   Glucose-Capillary 173 (H) 70 - 99 mg/dL  Glucose, capillary     Status: Abnormal   Collection Time: 03/02/22 10:28 PM  Result Value Ref Range   Glucose-Capillary 197 (H) 70 - 99 mg/dL  CBC     Status: Abnormal   Collection Time: 03/03/22  2:48 AM  Result Value Ref Range   WBC 14.3 (H) 4.0 - 10.5 K/uL   RBC 3.46 (L) 3.87 - 5.11 MIL/uL   Hemoglobin 9.4 (L) 12.0 - 15.0 g/dL   HCT 29.0 (L) 36.0 - 46.0 %   MCV 83.8 80.0 - 100.0 fL   MCH 27.2 26.0 - 34.0 pg   MCHC 32.4 30.0 - 36.0 g/dL   RDW 17.2 (H) 11.5 - 15.5 %   Platelets  251 150 - 400 K/uL   nRBC 0.0 0.0 - 0.2 %  Basic metabolic panel     Status: Abnormal   Collection Time: 03/03/22  2:48 AM  Result Value Ref Range   Sodium 135 135 - 145 mmol/L   Potassium 3.3 (L) 3.5 - 5.1 mmol/L   Chloride 98 98 - 111 mmol/L   CO2 28 22 - 32 mmol/L   Glucose, Bld 156 (H) 70 - 99 mg/dL   BUN 27 (H) 8 - 23 mg/dL   Creatinine, Ser 1.11 (H) 0.44 - 1.00 mg/dL   Calcium 8.1 (L) 8.9 - 10.3 mg/dL   GFR, Estimated 49 (L) >60 mL/min   Anion gap 9 5 - 15    DG FEMUR PORT MIN 2 VIEWS LEFT  Result Date: 03/02/2022 CLINICAL DATA:  Postoperative left hip ORIF EXAM: LEFT FEMUR PORTABLE 2 VIEWS COMPARISON:  03/02/2022 intraoperative images and preoperative radiographs from 03/01/2022 FINDINGS: There has been placement of an IM nail and left hip lag screw along with 2 distal interlocking screws for fixation of the left hip intertrochanteric fracture. Near anatomic alignment has been achieved. No new periprosthetic fracture or complicating feature is observed. IMPRESSION: 1. ORIF of the intertrochanteric fracture without complicating feature observed. Electronically Signed   By: Van Clines M.D.   On: 03/02/2022 16:44   DG HIP  UNILAT WITH PELVIS 2-3 VIEWS LEFT  Result Date: 03/02/2022 CLINICAL DATA:  Left intertrochanteric femur fracture. EXAM: DG HIP (WITH OR WITHOUT PELVIS) 2-3V LEFT COMPARISON:  Left hip x-rays from yesterday. FLUOROSCOPY TIME:  Radiation Exposure Index (as provided by the fluoroscopic device): 16.0 mGy Kerma C-arm fluoroscopic images were obtained intraoperatively and submitted for post operative interpretation. FINDINGS: Multiple intraoperative fluoroscopic images demonstrate interval cephalomedullary rod fixation of left intertrochanteric femur fracture, now in near anatomic alignment. IMPRESSION: 1. Intraoperative fluoroscopic guidance for left intertrochanteric femur fracture ORIF. Electronically Signed   By: Titus Dubin M.D.   On: 03/02/2022 15:14   DG C-Arm 1-60 Min-No Report  Result Date: 03/02/2022 Fluoroscopy was utilized by the requesting physician.  No radiographic interpretation.   DG C-Arm 1-60 Min-No Report  Result Date: 03/02/2022 Fluoroscopy was utilized by the requesting physician.  No radiographic interpretation.   DG Chest 1 View  Result Date: 03/01/2022 CLINICAL DATA:  Known left hip fracture EXAM: CHEST  1 VIEW COMPARISON:  02/26/2021 FINDINGS: Cardiac shadow is enlarged. Aortic calcifications are again noted. Lungs are well aerated with minimal left basilar scarring stable from the prior exam. No bony abnormality is seen. IMPRESSION: No acute abnormality noted. Electronically Signed   By: Inez Catalina M.D.   On: 03/01/2022 20:37   DG Hip Unilat With Pelvis 2-3 Views Left  Result Date: 03/01/2022 CLINICAL DATA:  Recent fall with left hip pain, initial encounter EXAM: DG HIP (WITH OR WITHOUT PELVIS) 3V LEFT COMPARISON:  02/26/2021 FINDINGS: Pelvic ring is intact. Comminuted intratrochanteric fracture is noted in the left femur with impaction and angulation at the fracture site. No soft tissue abnormality is seen. IMPRESSION: Comminuted left intratrochanteric fracture.  Electronically Signed   By: Inez Catalina M.D.   On: 03/01/2022 20:37    Assessment/Plan: 1 Day Post-Op   Principal Problem:   Closed hip fracture requiring operative repair, left, initial encounter ([place]) Active Problems:   HTN (hypertension)   Hyperlipidemia associated with type 2 diabetes mellitus ([place])   Diabetes type 2, controlled ([place])   Vitamin B12 deficiency   Dementia ([place])   Hypokalemia  Lymphedema   Hypothyroidism   Vitamin D deficiency   Acute renal insufficiency   Up with therapy, WBAT Hgb is stable. Lovenox for DVT prophylaxis Discharge planning   Hannah Duran , MD 03/03/2022, 7:27 AM

## 2022-03-03 NOTE — Evaluation (Signed)
Physical Therapy Evaluation Patient Details Name: Hannah Duran MRN: [id number] DOB: [birth year] Today's Date: 03/03/2022  History of Present Illness  Per chart, Hannah Duran is a 88 y.o. female with medical history significant of T2DM, HLD, HTN, hypothyroid, dementia, lymphedema, B12 deficiency, vitamin D deficiency who is from an assisted living facility where she was bending over today with her walker to pick up a paper towel and fell on her left side.  She was found to have a left intertrochanteric fracture requiring surgical repair.   S/p L hip IM nailing on 03/02/22.  Clinical Impression  Pt presents supine in bed alert and oriented and agreeable to PT. Pt exhibits decreased mobility,  LLE strength, and cognition requiring max A for supine to sit transfer with max VC and repeated step by step sequencing of instructions which pt showed poor follow through with. Unable to assess sit to stand transfer and standing mobility as further assistance was not available to perform safely. Pt will require additional skilled PT and SNF to return to PLOF of function at ALF and to decrease risk of falls.      Recommendations for follow up therapy are one component of a multi-disciplinary discharge planning process, led by the attending physician.  Recommendations may be updated based on patient status, additional functional criteria and insurance authorization.  Follow Up Recommendations Skilled nursing-short term rehab (<3 hours/day) Can patient physically be transported by private vehicle: No    Assistance Recommended at Discharge Intermittent Supervision/Assistance  Patient can return home with the following  Two people to help with walking and/or transfers;Assistance with feeding;Assistance with cooking/housework;Assist for transportation;A lot of help with bathing/dressing/bathroom    Equipment Recommendations BSC/3in1  Recommendations for Other Services       Functional Status Assessment  Patient has had a recent decline in their functional status and demonstrates the ability to make significant improvements in function in a reasonable and predictable amount of time.     Precautions / Restrictions Precautions Precautions: Fall Restrictions Weight Bearing Restrictions: Yes LLE Weight Bearing: Weight bearing as tolerated Other Position/Activity Restrictions: WBAT as of 03/02/22 at 1702.      Mobility  Bed Mobility Overal bed mobility: Needs Assistance Bed Mobility: Rolling, Supine to Sit, Sit to Supine Rolling: Max assist, +2 for physical assistance   Supine to sit: Max assist, HOB elevated, +2 for physical assistance Sit to supine: Max assist, +2 for physical assistance   General bed mobility comments: Requires max VC for patient to even attempt movement and then poor execution. Cognition likely underlying issue here    Transfers                   General transfer comment: Unable to attempt because assistance not available at time of attempted sit to stand. Attempted for several minutes but pt would not initiate movement    Ambulation/Gait                  Stairs            Wheelchair Mobility    Modified Rankin (Stroke Patients Only)       Balance Overall balance assessment: Needs assistance Sitting-balance support: Bilateral upper extremity supported Sitting balance-Leahy Scale: Fair Sitting balance - Comments: Use of railings for sitting balance  Pertinent Vitals/Pain Pain Assessment Pain Assessment: 0-10 Pain Score: 5  Pain Location: L hip Pain Descriptors / Indicators: Aching, Sore Pain Intervention(s): Limited activity within patient's tolerance, Monitored during session    Home Living Family/patient expects to be discharged to:: Assisted living                 Home Equipment: Conservation officer, nature (2 wheels) Additional Comments: Resides at [facility]    Prior  Function Prior Level of Function : Needs assist  Cognitive Assist : Mobility (cognitive);ADLs (cognitive) Mobility (Cognitive): Step by step cues ADLs (Cognitive): Step by step cues Physical Assist : ADLs (physical)   ADLs (physical): Bathing;Dressing;IADLs Mobility Comments: Per OT eval note, per son, pt was ambulatory within ALF with RW without supervision prior to this fall ADLs Comments: Per OT eval note, assist with all ADLs at baseline d/t dementia.     Hand Dominance        Extremity/Trunk Assessment   Upper Extremity Assessment Upper Extremity Assessment: Defer to OT evaluation    Lower Extremity Assessment Lower Extremity Assessment: Difficult to assess due to impaired cognition;Generalized weakness LLE Deficits / Details: L hip fx LLE: Unable to fully assess due to immobilization    Cervical / Trunk Assessment Cervical / Trunk Assessment: Kyphotic  Communication   Communication: No difficulties  Cognition Arousal/Alertness: Awake/alert Behavior During Therapy: WFL for tasks assessed/performed Overall Cognitive Status: History of cognitive impairments - at baseline                                 General Comments: h/o dementia, requires max VC and hand over hand sequencing of task to execute with increased repitition. Does not initiate and execute tasks well        General Comments General comments (skin integrity, edema, etc.): 2 incision sites at L hip intact with dressings.    Exercises     Assessment/Plan    PT Assessment Patient needs continued PT services  PT Problem List Decreased strength;Decreased range of motion;Decreased balance;Decreased mobility;Decreased cognition;Pain       PT Treatment Interventions DME instruction;Therapeutic activities;Gait training;Stair training    PT Goals (Current goals can be found in the Care Plan section)  Acute Rehab PT Goals Patient Stated Goal: Return to ALF and be safe in this setting and for  hip pain to go down PT Goal Formulation: With patient Time For Goal Achievement: 03/17/22 Potential to Achieve Goals: Good    Frequency BID     Co-evaluation               AM-PAC PT "6 Clicks" Mobility  Outcome Measure Help needed turning from your back to your side while in a flat bed without using bedrails?: A Lot Help needed moving from lying on your back to sitting on the side of a flat bed without using bedrails?: A Lot Help needed moving to and from a bed to a chair (including a wheelchair)?: Total Help needed standing up from a chair using your arms (e.g., wheelchair or bedside chair)?: Total Help needed to walk in hospital room?: Total Help needed climbing 3-5 steps with a railing? : Total 6 Click Score: 8    End of Session Equipment Utilized During Treatment: Gait belt Activity Tolerance: Patient limited by pain;Other (comment) (Patient limited by cognitive deficits) Patient left: in bed;with call bell/phone within reach;with SCD's reapplied;with bed alarm set Nurse Communication: Mobility status PT Visit  Diagnosis: Pain;Difficulty in walking, not elsewhere classified (R26.2);Muscle weakness (generalized) (M62.81);Unsteadiness on feet (R26.81);Repeated falls (R29.6);Other abnormalities of gait and mobility (R26.89) Pain - Right/Left: Left Pain - part of body: Hip    Time: 2025-4270 PT Time Calculation (min) (ACUTE ONLY): 20 min   Charges:   PT Evaluation $PT Eval Moderate Complexity: 1 Mod PT Treatments $Therapeutic Activity: 8-22 mins      Bradly Chris PT, DPT  03/03/2022, 5:46 PM

## 2022-03-04 ENCOUNTER — Encounter: Payer: Self-pay | Admitting: Family Medicine

## 2022-03-04 DIAGNOSIS — S72002A Fracture of unspecified part of neck of left femur, initial encounter for closed fracture: Secondary | ICD-10-CM | POA: Diagnosis not present

## 2022-03-04 LAB — CBC
HCT: 28 % — ABNORMAL LOW (ref 36.0–46.0)
Hemoglobin: 9.1 g/dL — ABNORMAL LOW (ref 12.0–15.0)
MCH: 27.3 pg (ref 26.0–34.0)
MCHC: 32.5 g/dL (ref 30.0–36.0)
MCV: 84.1 fL (ref 80.0–100.0)
Platelets: 219 10*3/uL (ref 150–400)
RBC: 3.33 MIL/uL — ABNORMAL LOW (ref 3.87–5.11)
RDW: 17.2 % — ABNORMAL HIGH (ref 11.5–15.5)
WBC: 12.3 10*3/uL — ABNORMAL HIGH (ref 4.0–10.5)
nRBC: 0 % (ref 0.0–0.2)

## 2022-03-04 LAB — BASIC METABOLIC PANEL
Anion gap: 7 (ref 5–15)
BUN: 26 mg/dL — ABNORMAL HIGH (ref 8–23)
CO2: 31 mmol/L (ref 22–32)
Calcium: 8.4 mg/dL — ABNORMAL LOW (ref 8.9–10.3)
Chloride: 99 mmol/L (ref 98–111)
Creatinine, Ser: 0.76 mg/dL (ref 0.44–1.00)
GFR, Estimated: >60 mL/min (ref >60)
Glucose, Bld: 121 mg/dL — ABNORMAL HIGH (ref 70–99)
Potassium: 3.3 mmol/L — ABNORMAL LOW (ref 3.5–5.1)
Sodium: 137 mmol/L (ref 135–145)

## 2022-03-04 LAB — GLUCOSE, CAPILLARY
Glucose-Capillary: 117 mg/dL — ABNORMAL HIGH (ref 70–99)
Glucose-Capillary: 163 mg/dL — ABNORMAL HIGH (ref 70–99)
Glucose-Capillary: 128 mg/dL — ABNORMAL HIGH (ref 70–99)
Glucose-Capillary: 141 mg/dL — ABNORMAL HIGH (ref 70–99)

## 2022-03-04 LAB — MAGNESIUM: Magnesium: 2.1 mg/dL (ref 1.7–2.4)

## 2022-03-04 NOTE — NC FL2 (Signed)
[place] LEVEL OF CARE SCREENING TOOL     IDENTIFICATION  Patient Name: Hannah Duran Birthdate: Mar 22, 1934 Sex: female Admission Date (Current Location): 03/01/2022  [facility] and [place] Number:  Engineering geologist and Address:  [facility], [address] [place], [place] [address]      Provider Number: [id number]  Attending Physician Name and Address:  Jennye Boroughs, MD  Relative Name and Phone Number:  Pasha Gadison [phone]    Current Level of Care: Hospital Recommended Level of Care: [place] Prior Approval Number:    Date Approved/Denied:   PASRR Number: [id number]  Discharge Plan: SNF    Current Diagnoses: Patient Active Problem List   Diagnosis Date Noted   Closed hip fracture requiring operative repair, left, initial encounter ([place]) 03/01/2022   Acute renal insufficiency 03/01/2022   Constipation 07/17/2021   Hypothyroidism 07/17/2021   Vitamin D deficiency 07/17/2021   Lymphedema 07/16/2021   Estrogen deficiency 09/19/2020   Hearing loss 09/19/2020   Leg weakness, bilateral 03/21/2020   Hypokalemia 03/21/2020   Dementia ([place]) 01/18/2020   Poor balance 06/29/2019   Vitamin B12 deficiency 03/29/2019   Visual hallucinations 01/05/2019   History of recurrent UTIs 01/05/2019   Encounter for screening mammogram for breast cancer 05/28/2017   Routine general medical examination at a health care facility 05/19/2016   Ankle edema 05/01/2015   Venous (peripheral) insufficiency 05/01/2015   Encounter for Medicare annual wellness exam 06/02/2013   Internal hemorrhoid 06/21/2012   Morbid obesity ([place])    HTN (hypertension)    Hyperlipidemia associated with type 2 diabetes mellitus ([place])    Diabetes type 2, controlled ([place])     Orientation RESPIRATION BLADDER Height & Weight     Self, Place, Situation  Normal Continent Weight: 108.9 kg Height:  '5\' 7"'$  (170.2 cm)  BEHAVIORAL SYMPTOMS/MOOD  NEUROLOGICAL BOWEL NUTRITION STATUS      Continent Diet (see DC summary)  AMBULATORY STATUS COMMUNICATION OF NEEDS Skin   Extensive Assist   Normal, Surgical wounds                       Personal Care Assistance Level of Assistance  Bathing, Feeding, Dressing Bathing Assistance: Maximum assistance Feeding assistance: Independent Dressing Assistance: Maximum assistance     Functional Limitations Info             SPECIAL CARE FACTORS FREQUENCY  PT (By licensed PT), OT (By licensed OT)     PT Frequency: 5 times per week OT Frequency: 5 times per week            Contractures Contractures Info: Not present    Additional Factors Info  Code Status, Allergies Code Status Info: DNR Allergies Info: Penicillins, Sulfamethoxazole-trimethoprim, Amoxicillin           Current Medications (03/04/2022):  This is the current hospital active medication list Current Facility-Administered Medications  Medication Dose Route Frequency Provider Last Rate Last Admin   acetaminophen (TYLENOL) tablet 650 mg  650 mg Oral Q6H PRN [place] Park, MD       Or   acetaminophen (TYLENOL) suppository 650 mg  650 mg Rectal Q6H PRN Thornton Park, MD       albuterol (PROVENTIL) (2.5 MG/3ML) 0.083% nebulizer solution 2.5 mg  2.5 mg Nebulization Q4H PRN Thornton Park, MD       alum & mag hydroxide-simeth (MAALOX/MYLANTA) 200-200-20 MG/5ML suspension 30 mL  30 mL Oral Q4H PRN Thornton Park, MD  atorvastatin (LIPITOR) tablet 20 mg  20 mg Oral Daily Thornton Park, MD   20 mg at 03/04/22 1007   bisacodyl (DULCOLAX) suppository 10 mg  10 mg Rectal Daily PRN Thornton Park, MD       docusate sodium (COLACE) capsule 100 mg  100 mg Oral BID Thornton Park, MD   100 mg at 03/04/22 1007   enoxaparin (LOVENOX) injection 55 mg  0.5 mg/kg Subcutaneous Q24H Thornton Park, MD   55 mg at 03/04/22 0825   fentaNYL (SUBLIMAZE) injection 12.5-50 mcg  12.5-50 mcg Intravenous Q2H PRN Thornton Park, MD       ferrous sulfate tablet 325 mg  325 mg Oral TID Lorre Munroe, MD   325 mg at 03/04/22 1207   HYDROmorphone (DILAUDID) injection 0.5-1 mg  0.5-1 mg Intravenous Q4H PRN Thornton Park, MD       insulin aspart (novoLOG) injection 0-15 Units  0-15 Units Subcutaneous TID [facility] Thornton Park, MD   3 Units at 03/04/22 1207   losartan (COZAAR) tablet 25 mg  25 mg Oral Daily Thornton Park, MD   25 mg at 03/04/22 1007   menthol-cetylpyridinium (CEPACOL) lozenge 3 mg  1 lozenge Oral PRN Thornton Park, MD       Or   phenol (CHLORASEPTIC) mouth spray 1 spray  1 spray Mouth/Throat PRN Thornton Park, MD       metFORMIN (GLUCOPHAGE) tablet 500 mg  500 mg Oral BID WC Thornton Park, MD   500 mg at 03/04/22 0825   methocarbamol (ROBAXIN) tablet 500 mg  500 mg Oral Q6H PRN Thornton Park, MD       Or   methocarbamol (ROBAXIN) 500 mg in dextrose 5 % 50 mL IVPB  500 mg Intravenous Q6H PRN Thornton Park, MD       metoprolol tartrate (LOPRESSOR) injection 5 mg  5 mg Intravenous Q6H PRN Thornton Park, MD       ondansetron [facility]) tablet 4 mg  4 mg Oral Q6H PRN Thornton Park, MD       Or   ondansetron [facility]) injection 4 mg  4 mg Intravenous Q6H PRN Thornton Park, MD       oxyCODONE (Oxy IR/ROXICODONE) immediate release tablet 10-15 mg  10-15 mg Oral Q4H PRN Thornton Park, MD       oxyCODONE (Oxy IR/ROXICODONE) immediate release tablet 5-10 mg  5-10 mg Oral Q4H PRN Thornton Park, MD       polyethylene glycol (MIRALAX / GLYCOLAX) packet 17 g  17 g Oral Daily PRN Thornton Park, MD   17 g at 03/04/22 0527   QUEtiapine (SEROQUEL) tablet 25 mg  25 mg Oral QHS Thornton Park, MD   25 mg at 03/03/22 2128   senna (SENOKOT) tablet 8.6 mg  1 tablet Oral BID Thornton Park, MD   8.6 mg at 03/04/22 1007   traMADol (ULTRAM) tablet 50 mg  50 mg Oral Q6H Thornton Park, MD   50 mg at 03/04/22 1207     Discharge Medications: Please see discharge summary for a list  of discharge medications.  Relevant Imaging Results:  Relevant Lab Results:   Additional Information SS# [id number]  Conception Oms, RN

## 2022-03-04 NOTE — TOC Progression Note (Signed)
Transition of Care [facility]) - Progression Note    Patient Details  Name: Hannah Duran MRN: [id number] Date of Birth: 1934/08/26  [facility]) CM/SW [place], RN Phone Number: 03/04/2022, 3:34 PM  Clinical Narrative:    Met with the patient and her son in the room, She lives at [facility], She is somewhat confused They are agreeable for her to go to [place] completed        Expected Discharge Plan and Services                                                 Social Determinants of Health (SDOH) Interventions    Readmission Risk Interventions     No data to display

## 2022-03-04 NOTE — Progress Notes (Signed)
Physical Therapy Treatment Patient Details Name: Hannah Duran MRN: [id number] DOB: 1934-07-01 Today's Date: 03/04/2022   History of Present Illness 88 y.o. female with medical history significant of DMII, HLD, HTN, hypothyroid, dementia, lymphedema, B12 deficiency, vitamin D deficiency who is from an assisted living facility where she fell on her left side trying to get something off the floor.  Found to have a left intertrochanteric fracture requiring ORIF (IM nailing) 03/02/22.    PT Comments    Pt continues to be confused, more c/o pain with this session with decreased tolerance for exercises and needing more cuing and encouragement to engage with even basic supine exercises.  She was able to give occasional AAROM assist but generally showed less interest with activity this afternoon.  Deferred mobility attempts due to lethargy, c/o pain and increased difficulty following instructions.    Recommendations for follow up therapy are one component of a multi-disciplinary discharge planning process, led by the attending physician.  Recommendations may be updated based on patient status, additional functional criteria and insurance authorization.  Follow Up Recommendations  Skilled nursing-short term rehab (<3 hours/day) Can patient physically be transported by private vehicle: No   Assistance Recommended at Discharge Intermittent Supervision/Assistance  Patient can return home with the following Two people to help with walking and/or transfers;Assistance with feeding;Assistance with cooking/housework;Assist for transportation;Two people to help with bathing/dressing/bathroom   Equipment Recommendations  BSC/3in1    Recommendations for Other Services       Precautions / Restrictions Precautions Precautions: Fall Restrictions LLE Weight Bearing: Weight bearing as tolerated     Mobility  Bed Mobility Overal bed mobility: Needs Assistance Bed Mobility: Sit to Supine, Supine to Sit      Supine to sit: Max assist, +2 for physical assistance Sit to supine: Max assist, +2 for physical assistance   General bed mobility comments: deferred this afternoon session 2/2 decreased direction follow, increased c/o pain and generally interest in participating with even modest bed exercises    Transfers Overall transfer level: Needs assistance Equipment used: Rolling walker (2 wheels) Transfers: Sit to/from Stand Sit to Stand: Mod assist, Max assist, +2 physical assistance           General transfer comment: Heavy assist to maintain standing.  First attempt essentially maintained max assist with inability to get fully upright.  Further cuing and positioning assist on second atempt with ability to maintain standing ~90 seconds with mostly mod but occasional min assist to keep hips forward.  Pt made effort to move L LE minimally but unable to actually weight shift or take anything close to a step.    Ambulation/Gait               General Gait Details: unable/unsafe   Stairs             Wheelchair Mobility    Modified Rankin (Stroke Patients Only)       Balance Overall balance assessment: Needs assistance Sitting-balance support: Bilateral upper extremity supported Sitting balance-Leahy Scale: Fair Sitting balance - Comments: Use of railings for sitting balance, initially leaning backward but once assisted to square to EOB able to maintain w/o direct assist (close CGA due to cognition)     Standing balance-Leahy Scale: Poor Standing balance comment: reliant on walker and at least some (and sometimes heavy) assist                            Cognition Arousal/Alertness:  Awake/alert Behavior During Therapy: Anxious Overall Cognitive Status: History of cognitive impairments - at baseline                                 General Comments: h/o dementia, requires max VC and hand over hand sequencing of task to execute with increased  repitition. Does not initiate and execute tasks consistently/well        Exercises General Exercises - Lower Extremity Ankle Circles/Pumps: AROM, Strengthening, 15 reps Quad Sets: AROM, AAROM, 10 reps Short Arc Quad: AAROM, PROM, 10 reps Heel Slides: AAROM, PROM, 5 reps (lightly resisted leg ext) Hip ABduction/ADduction: AAROM, 10 reps    General Comments        Pertinent Vitals/Pain Pain Assessment Pain Assessment: Faces Faces Pain Scale: Hurts even more Pain Location: L hip    Home Living                          Prior Function            PT Goals (current goals can now be found in the care plan section) Progress towards PT goals: Progressing toward goals    Frequency    BID      PT Plan Current plan remains appropriate    Co-evaluation              AM-PAC PT "6 Clicks" Mobility   Outcome Measure  Help needed turning from your back to your side while in a flat bed without using bedrails?: A Lot Help needed moving from lying on your back to sitting on the side of a flat bed without using bedrails?: Total Help needed moving to and from a bed to a chair (including a wheelchair)?: Total Help needed standing up from a chair using your arms (e.g., wheelchair or bedside chair)?: Total Help needed to walk in hospital room?: Total Help needed climbing 3-5 steps with a railing? : Total 6 Click Score: 7    End of Session Equipment Utilized During Treatment: Gait belt Activity Tolerance: Patient limited by pain;Other (comment) Patient left: in bed;with call bell/phone within reach;with bed alarm set;with family/visitor present Nurse Communication: Mobility status PT Visit Diagnosis: Pain;Difficulty in walking, not elsewhere classified (R26.2);Muscle weakness (generalized) (M62.81);Unsteadiness on feet (R26.81);Repeated falls (R29.6);Other abnormalities of gait and mobility (R26.89) Pain - Right/Left: Left Pain - part of body: Hip     Time:  3612-2449 PT Time Calculation (min) (ACUTE ONLY): 16 min  Charges:  $Therapeutic Exercise: 8-22 mins                     Kreg Shropshire, DPT 03/04/2022, 4:16 PM

## 2022-03-04 NOTE — Progress Notes (Addendum)
Progress Note    Hannah Duran  CBJ:[id number] DOB: September 03, 1933  DOA: 03/01/2022 PCP: Abner Greenspan, MD      Brief Narrative:    Medical records reviewed and are as summarized below:  88 year old female with history of T2DM, HTN, HLD, B12 deficiency, vitamin D deficiency, low thyroid, dementia who has a left intertrochanteric fracture s/p mechanical fall at  her facility      Assessment/Plan:   Principal Problem:   Closed hip fracture requiring operative repair, left, initial encounter ([place]) Active Problems:   Hypokalemia   HTN (hypertension)   Hyperlipidemia associated with type 2 diabetes mellitus ([place])   Diabetes type 2, controlled ([place])   Vitamin B12 deficiency   Dementia ([place])   Lymphedema   Hypothyroidism   Vitamin D deficiency   Acute renal insufficiency   Body mass index is 37.59 kg/m. (Obesity)    Closed left hip fracture, s/p mechanical fall: S/p IM fixation of left hip fracture.  Analgesics as needed for pain.  Continue Lovenox for DVT prophylaxis.  Outpatient follow-up with Dr. Mack Guise, orthopedic surgeon.  Acute blood loss anemia: H&H is trending down but there is no indication for blood transfusion at this time.  Hypokalemia: Replete potassium and monitor levels  AKI: Improved  Vitamin B12 deficiency: Vitamin B12 level was 142.  Start vitamin B12 injections  History of vitamin D deficiency: Vitamin D level was normal (31.24 ng /ml)  Dementia: Continue supportive care.  She was evaluated by the speech therapist because of concern for dysphagia and holding food in her mouth.  Regular diet was recommended.  Other comorbidities include type II DM, hypertension, hyperlipidemia  Diet Order             Diet Carb Modified Fluid consistency: Thin; Room service appropriate? Yes with Assist  Diet effective now                            Consultants: Orthopedic surgeon  Procedures: S/p IM fixation of left hip  fracture    Medications:    atorvastatin  20 mg Oral Daily   docusate sodium  100 mg Oral BID   enoxaparin (LOVENOX) injection  0.5 mg/kg Subcutaneous Q24H   ferrous sulfate  325 mg Oral TID PC   insulin aspart  0-15 Units Subcutaneous TID WC   losartan  25 mg Oral Daily   metFORMIN  500 mg Oral BID WC   QUEtiapine  25 mg Oral QHS   senna  1 tablet Oral BID   traMADol  50 mg Oral Q6H   Continuous Infusions:  methocarbamol (ROBAXIN) IV       Anti-infectives (From admission, onward)    Start     Dose/Rate Route Frequency Ordered Stop   03/02/22 2000  ceFAZolin (ANCEF) IVPB 2g/100 mL premix        2 g 200 mL/hr over 30 Minutes Intravenous Every 6 hours 03/02/22 1701 03/03/22 0217   03/02/22 1231  ceFAZolin (ANCEF) 2-4 GM/100ML-% IVPB       Note to Pharmacy: Olena Mater F: cabinet override      03/02/22 1231 03/03/22 0217   03/02/22 0600  ceFAZolin (ANCEF) IVPB 2g/100 mL premix        2 g 200 mL/hr over 30 Minutes Intravenous On call to O.R. 03/01/22 2321 03/02/22 1355              Family Communication/Anticipated D/C date and plan/Code  Status   DVT prophylaxis: SCDs Start: 03/02/22 1702 Place TED hose Start: 03/02/22 1702 SCDs Start: 03/01/22 2314     Code Status: DNR  Family Communication: Discussed with Benjamine Mola, daughter-in-law, at the bedside Disposition Plan: Plan to discharge to SNF   Status is: Inpatient Remains inpatient appropriate because: Awaiting placement to SNF       Subjective:   She complains of left hip pain after walking with PT. Benjamine Mola, daughter-in-law, was at the bedside  Objective:    Vitals:   03/03/22 1431 03/03/22 2044 03/04/22 0500 03/04/22 0751  BP: 125/73 (!) 122/51 114/62 (!) 123/54  Pulse: 94 86 81 76  Resp: '18 17 16   '$ Temp: 98.2 F (36.8 C) 98.9 F (37.2 C) 98.2 F (36.8 C) 98.1 F (36.7 C)  TempSrc:      SpO2: 94% 95% 97% 94%  Weight:      Height:       No data found.   Intake/Output Summary  (Last 24 hours) at 03/04/2022 1234 Last data filed at 03/04/2022 0816 Gross per 24 hour  Intake 240 ml  Output 725 ml  Net -485 ml   Filed Weights   03/01/22 1918 03/01/22 2300  Weight: 99.8 kg 108.9 kg    Exam:  GEN: NAD SKIN: Warm and dry EYES: No pallor or icterus ENT: MMM CV: RRR PULM: CTA B ABD: soft, ND, NT, +BS CNS: AAO x 1 (person), non focal EXT: Left hip with mild swelling and tenderness.  Dressing on left hip surgical wound is intact and dry       Data Reviewed:   I have personally reviewed following labs and imaging studies:  Labs: Labs show the following:   Basic Metabolic Panel: Recent Labs  Lab 03/01/22 2157 03/02/22 0320 03/02/22 1229 03/02/22 1246 03/03/22 0248 03/04/22 0542  NA 139 138 136  --  135 137  K 2.9* 2.9* 3.1*   < > 3.3* 3.3*  CL 90* 93* 92*  --  98 99  CO2 32 32  --   --  28 31  GLUCOSE 159* 157* 114*  --  156* 121*  BUN 33* 35* 25*  --  27* 26*  CREATININE 1.30* 1.13* 1.20*  --  1.11* 0.76  CALCIUM 9.9 9.0  --   --  8.1* 8.4*  MG  --   --   --   --   --  2.1   < > = values in this interval not displayed.   GFR Estimated Creatinine Clearance: 65.3 mL/min (by C-G formula based on SCr of 0.76 mg/dL). Liver Function Tests: Recent Labs  Lab 03/02/22 0320  AST 25  ALT 19  ALKPHOS 90  BILITOT 0.8  PROT 6.4*  ALBUMIN 3.6   No results for input(s): "LIPASE", "AMYLASE" in the last 168 hours. No results for input(s): "AMMONIA" in the last 168 hours. Coagulation profile No results for input(s): "INR", "PROTIME" in the last 168 hours.  CBC: Recent Labs  Lab 03/01/22 2157 03/02/22 0320 03/02/22 1229 03/03/22 0248 03/04/22 0542  WBC 21.2* 16.7*  --  14.3* 12.3*  NEUTROABS 18.7*  --   --   --   --   HGB 12.4 10.6* 10.5* 9.4* 9.1*  HCT 38.6 32.4* 31.0* 29.0* 28.0*  MCV 84.5 83.5  --  83.8 84.1  PLT 332 286  --  251 219   Cardiac Enzymes: No results for input(s): "CKTOTAL", "CKMB", "CKMBINDEX", "TROPONINI" in the last  168 hours. BNP (last 3  results) No results for input(s): "PROBNP" in the last 8760 hours. CBG: Recent Labs  Lab 03/03/22 1115 03/03/22 1634 03/03/22 2102 03/04/22 0758 03/04/22 1139  GLUCAP 164* 165* 172* 117* 163*   D-Dimer: No results for input(s): "DDIMER" in the last 72 hours. Hgb A1c: Recent Labs    03/02/22 0320  HGBA1C 6.1*   Lipid Profile: No results for input(s): "CHOL", "HDL", "LDLCALC", "TRIG", "CHOLHDL", "LDLDIRECT" in the last 72 hours. Thyroid function studies: Recent Labs    03/02/22 0320  TSH 2.243   Anemia work up: Recent Labs    03/02/22 0320  VITAMINB12 142*   Sepsis Labs: Recent Labs  Lab 03/01/22 2157 03/02/22 0320 03/03/22 0248 03/04/22 0542  WBC 21.2* 16.7* 14.3* 12.3*    Microbiology No results found for this or any previous visit (from the past 240 hour(s)).  Procedures and diagnostic studies:  DG FEMUR PORT MIN 2 VIEWS LEFT  Result Date: 03/02/2022 CLINICAL DATA:  Postoperative left hip ORIF EXAM: LEFT FEMUR PORTABLE 2 VIEWS COMPARISON:  03/02/2022 intraoperative images and preoperative radiographs from 03/01/2022 FINDINGS: There has been placement of an IM nail and left hip lag screw along with 2 distal interlocking screws for fixation of the left hip intertrochanteric fracture. Near anatomic alignment has been achieved. No new periprosthetic fracture or complicating feature is observed. IMPRESSION: 1. ORIF of the intertrochanteric fracture without complicating feature observed. Electronically Signed   By: Van Clines M.D.   On: 03/02/2022 16:44   DG HIP UNILAT WITH PELVIS 2-3 VIEWS LEFT  Result Date: 03/02/2022 CLINICAL DATA:  Left intertrochanteric femur fracture. EXAM: DG HIP (WITH OR WITHOUT PELVIS) 2-3V LEFT COMPARISON:  Left hip x-rays from yesterday. FLUOROSCOPY TIME:  Radiation Exposure Index (as provided by the fluoroscopic device): 16.0 mGy Kerma C-arm fluoroscopic images were obtained intraoperatively and submitted for  post operative interpretation. FINDINGS: Multiple intraoperative fluoroscopic images demonstrate interval cephalomedullary rod fixation of left intertrochanteric femur fracture, now in near anatomic alignment. IMPRESSION: 1. Intraoperative fluoroscopic guidance for left intertrochanteric femur fracture ORIF. Electronically Signed   By: Titus Dubin M.D.   On: 03/02/2022 15:14   DG C-Arm 1-60 Min-No Report  Result Date: 03/02/2022 Fluoroscopy was utilized by the requesting physician.  No radiographic interpretation.   DG C-Arm 1-60 Min-No Report  Result Date: 03/02/2022 Fluoroscopy was utilized by the requesting physician.  No radiographic interpretation.               LOS: 3 days   Marigny Borre  [facility]   Pager on [email or web address]. If 7PM-7AM, please contact night-coverage at [email or web address]     03/04/2022, 12:34 PM

## 2022-03-04 NOTE — Progress Notes (Signed)
OT Cancellation Note  Patient Details Name: Hannah Duran MRN: [id number] DOB: 1934-05-20   Cancelled Treatment:    Reason Eval/Treat Not Completed: Other (comment) Pt unavailable, eating lunch.  Check back 30 min later and still eating.  Will attempt later as time allows.   Leta Speller, MS, OTR/L  [name] 03/04/2022, 2:51 PM

## 2022-03-04 NOTE — Evaluation (Signed)
Clinical/Bedside Swallow Evaluation Patient Details  Name: Hannah Duran MRN: [id number] Date of Birth: 07-17-1934  Today's Date: 03/04/2022 Time: SLP Start Time (ACUTE ONLY): 64 SLP Stop Time (ACUTE ONLY): 0910 SLP Time Calculation (min) (ACUTE ONLY): 55 min  Past Medical History:  Past Medical History:  Diagnosis Date   Anxiety    Congenital foot deformity    Dementia (HCC)    Dermatophytosis    Hallucinations    HLD (hyperlipidemia)    HTN (hypertension)    Hyperglycemia    Irregular heart rhythm    pt unsure of what type; followed by PCP only   Ischemic colitis ([place]) 02/2012   on colonoscopy   Memory loss    Obesity    Rotator cuff syndrome    Past Surgical History:  Past Surgical History:  Procedure Laterality Date   CATARACT EXTRACTION W/PHACO Right 09/09/2016   Procedure: CATARACT EXTRACTION PHACO AND INTRAOCULAR LENS PLACEMENT ([place]);  Surgeon: Leandrew Koyanagi, MD;  Location: [facility];  Service: Ophthalmology;  Laterality: Right;  right   CATARACT EXTRACTION W/PHACO Left 12/16/2016   Procedure: CATARACT EXTRACTION PHACO AND INTRAOCULAR LENS PLACEMENT ([place])  Left;  Surgeon: Leandrew Koyanagi, MD;  Location: [facility];  Service: Ophthalmology;  Laterality: Left;   HUMERUS FRACTURE SURGERY  10/1997   INTRAMEDULLARY (IM) NAIL INTERTROCHANTERIC Left 03/02/2022   Procedure: INTRAMEDULLARY (IM) NAIL INTERTROCHANTRIC;  Surgeon: Thornton Park, MD;  Location: [facility];  Service: Orthopedics;  Laterality: Left;   TOTAL ABDOMINAL HYSTERECTOMY  1980's   due to menometrorrhagia   HPI:  Pt is a 88 y.o. female with medical history significant of Dementia w/ hallucinations (see Neurology note 2021), DMII, HLD, HTN, hypothyroid, lymphedema, B12 deficiency, vitamin D deficiency who is from an assisted living facility where she fell on her left side trying to get something off the floor.  Found to have a left intertrochanteric fracture requiring ORIF (IM  nailing) 03/02/22.   CXR this admit: No acute abnormality noted.    Assessment / Plan / Recommendation  Clinical Impression   Pt seen today for BSE. Pt was awake, pleasant; verbal. Confusion noted in her engagement/interactions - few hallucinations: pt stated "the female nurse is sleeping over there in the corner". Son arrived at end of session. He endorsed baseline Dementia.   Pt appears to present w/ grossly adequate oropharyngeal phase swallow function in setting of declined Cognitive status; Baseline Dementia. ANY Cognitive decline can impact overall awareness/timing of swallow and safety during po tasks which increases risk for aspiration, choking. This confusion can certainly increase during a period of illness/change in status or environment -- such as this fall, surgery. W/ ANY increased confusion, more oral phase deficits may be seen such as oral holding or pocketing of foods/Pills. This was seen by NSG yesterday. Pt's risk for aspiration and oral phase holding/pocketing can be reduced when following general aspiration precautions, Supervision during meals, and using a moistened, soft diet consistency of Cut foods(small bites) in order not to overload the mouth w/ foods/liquids. She required min-mod verbal/visual cues for follow through during po tasks and self-feeding, especially to clear mouth BETWEEN bites.   Pt consumed several trials of thin liquids, purees, and soft solid foods w/ No overt clinical s/s of aspiration noted: no decline in vocal quality; no cough, and no decline in respiratory status during/post trials. O2 sats remained 97%. Oral phase was adequate for bolus management and oral clearing of the boluses given. Mastication of softened solids adequate given Time  and moistening the foods Cut small. Pt self-fed but  required min-mod verbal cues and guidance to slow down and not overload mouth w/ food -- clear mouth b/t bites/sips and alternate w/ sips of liquid to aid oral clearing (d/t  the Cognitive decline). She was able to feed self which improves safety of swallowing.  OM Exam appeared [facility] w/ No unilateral weakness noted. Some confusion of OM tasks and oral care noted.          In setting of baseline Dementia and Cognitive decline, and risk for choking/aspiration, recommend continue her current Regular diet w/ but attention to CUTTING foods Small and Moistening foods well vs particulate foods(for ease of oral phase/mastication); Thin liquids. General aspiration precautions; Supervision; reduce Distractions during meals and engage pt during meals for self-feeding. Pills Crushed in Puree for safer swallowing as needed IF pocketing of Pills noted by NSG. Support w/ tray setup and sitting up at meals. MD/NSG updated.   ST services recommends follow w/ Palliative Care for [place] and education re: impact of Cognitive decline/Dementia on swallowing. Suspect pt is close to/at her baseline. Precautions posted in room. SLP Visit Diagnosis: Dysphagia, unspecified (R13.10) (baseline Dementia - w/ intermittent oral phase holding/pocketing noted by staff (inconsistent and not noted this eval))    Aspiration Risk   (reduced following general aspiration precautions)    Diet Recommendation   current Regular diet w/ but attention to CUTTING foods Small and Moistening foods well vs particulate foods(for ease of oral phase/mastication); Thin liquids. General aspiration precautions including checking for oral clearing when needed; Supervision at meals as needed; reduce Distractions during meals and engage pt during meals for self-feeding. Support w/ tray setup and sitting up at meals.  Medication Administration: Whole meds with puree (vs need to CRUSH in Puree d/t Cognitive decline/Dementia)    Other  Recommendations Recommended Consults:  (n/a) Oral Care Recommendations: Oral care BID;Oral care before and after PO;Staff/trained caregiver to provide oral care (support pt) Other Recommendations:  (n/a)     Recommendations for follow up therapy are one component of a multi-disciplinary discharge planning process, led by the attending physician.  Recommendations may be updated based on patient status, additional functional criteria and insurance authorization.  Follow up Recommendations No SLP follow up      Assistance Recommended at Discharge Intermittent Supervision/Assistance (d/t baseline Dementia)  Functional Status Assessment Patient has had a recent decline in their functional status and demonstrates the ability to make significant improvements in function in a reasonable and predictable amount of time.  Frequency and Duration  (n/a)   (n/a)       Prognosis Prognosis for Safe Diet Advancement: Fair (-Good) Barriers to Reach Goals: Cognitive deficits;Time post onset;Severity of deficits;Behavior Barriers/Prognosis Comment: baseline Dementia      Swallow Study   General Date of Onset: 03/01/22 HPI: Pt is a 88 y.o. female with medical history significant of Dementia w/ hallucinations (see Neurology note 2021), DMII, HLD, HTN, hypothyroid, lymphedema, B12 deficiency, vitamin D deficiency who is from an assisted living facility where she fell on her left side trying to get something off the floor.  Found to have a left intertrochanteric fracture requiring ORIF (IM nailing) 03/02/22.   CXR this admit: No acute abnormality noted. Type of Study: Bedside Swallow Evaluation Previous Swallow Assessment: none Diet Prior to this Study: Regular;Thin liquids Temperature Spikes Noted: No Respiratory Status: Room air History of Recent Intubation: No Behavior/Cognition: Alert;Cooperative;Pleasant mood;Confused;Distractible;Requires cueing Oral Cavity Assessment: Within Functional Limits Oral Care  Completed by SLP: Yes Oral Cavity - Dentition: Adequate natural dentition Vision: Functional for self-feeding Self-Feeding Abilities: Needs set up Patient Positioning: Upright in bed (positioning  support in bed) Baseline Vocal Quality: Normal Volitional Cough: Strong Volitional Swallow: Able to elicit    Oral/Motor/Sensory Function Overall Oral Motor/Sensory Function: Within functional limits   Ice Chips Ice chips: Not tested   Thin Liquid Thin Liquid: Within functional limits Presentation: Cup;Self Fed;Straw (~10 ozs)    Nectar Thick Nectar Thick Liquid: Not tested   Honey Thick Honey Thick Liquid: Not tested   Puree Puree: Within functional limits Presentation: Spoon;Self Fed (~3 ozs)   Solid     Solid: Within functional limits Presentation: Self Fed;Spoon (12+ ozs) Other Comments: french toast, bacon        Orinda Kenner, MS, CCC-SLP Speech Language Pathologist Rehab Services; [place] [phone] (ascom) [place] 03/04/2022,5:08 PM

## 2022-03-04 NOTE — Plan of Care (Signed)
°  Problem: Elimination: °Goal: Will not experience complications related to bowel motility °Outcome: Progressing °Goal: Will not experience complications related to urinary retention °Outcome: Progressing °  °Problem: Skin Integrity: °Goal: Risk for impaired skin integrity will decrease °Outcome: Progressing °  °Problem: Safety: °Goal: Ability to remain free from injury will improve °Outcome: Progressing °  °

## 2022-03-04 NOTE — Progress Notes (Signed)
Physical Therapy Treatment Patient Details Name: Hannah Duran MRN: [id number] DOB: [birth year] Today's Date: 03/04/2022   History of Present Illness Hannah Duran is a 88 y.o. female with medical history significant of T2DM, HLD, HTN, hypothyroid, dementia, lymphedema, B12 deficiency, vitamin D deficiency who is from an assisted living facility where she fell on her left side trying to get something off the floor.  Found to have a left intertrochanteric fracture requiring surgical repair ( IM nailing) 03/02/22.    PT Comments    Pt with general confusion needing extra cuing and encouragement for even basic exercises and activities but did show some effort with all tasks.  She was able to do some A/AAROM exercises on the L but was clearly pain limited and generally anxious about doing much with it; much more able to do AROM on R.  Pt needed a lot of assist with bed mobility and getting to standing and though she did manage to maintain standing briefly with only +2 min assist she needed very heavy +2 assist to get to EOB and to standing and struggled to even attempt any meaningful functional weight shifting or mobility.    Recommendations for follow up therapy are one component of a multi-disciplinary discharge planning process, led by the attending physician.  Recommendations may be updated based on patient status, additional functional criteria and insurance authorization.  Follow Up Recommendations  Skilled nursing-short term rehab (<3 hours/day) Can patient physically be transported by private vehicle: No   Assistance Recommended at Discharge Intermittent Supervision/Assistance  Patient can return home with the following Two people to help with walking and/or transfers;Assistance with feeding;Assistance with cooking/housework;Assist for transportation;Two people to help with bathing/dressing/bathroom   Equipment Recommendations  BSC/3in1    Recommendations for Other Services        Precautions / Restrictions Precautions Precautions: Fall Restrictions LLE Weight Bearing: Weight bearing as tolerated     Mobility  Bed Mobility Overal bed mobility: Needs Assistance Bed Mobility: Sit to Supine, Supine to Sit     Supine to sit: Max assist, +2 for physical assistance Sit to supine: Max assist, +2 for physical assistance   General bed mobility comments: Pt anxious about pain, limited ability to assist    Transfers Overall transfer level: Needs assistance Equipment used: Rolling walker (2 wheels) Transfers: Sit to/from Stand Sit to Stand: Mod assist, Max assist, +2 physical assistance           General transfer comment: Heavy assist to maintain standing.  First attempt essentially maintained max assist with inability to get fully upright.  Further cuing and positioning assist on second atempt with ability to maintain standing ~90 seconds with mostly mod but occasional min assist to keep hips forward.  Pt made effort to move L LE minimally but unable to actually weight shift or take anything close to a step.    Ambulation/Gait               General Gait Details: unable/unsafe   Stairs             Wheelchair Mobility    Modified Rankin (Stroke Patients Only)       Balance Overall balance assessment: Needs assistance Sitting-balance support: Bilateral upper extremity supported Sitting balance-Leahy Scale: Fair Sitting balance - Comments: Use of railings for sitting balance, initially leaning backward but once assisted to square to EOB able to maintain w/o direct assist (close CGA due to cognition)     Standing balance-Leahy Scale: Poor Standing  balance comment: reliant on walker and at least some (and sometimes heavy) assist                            Cognition Arousal/Alertness: Awake/alert Behavior During Therapy: Anxious Overall Cognitive Status: History of cognitive impairments - at baseline                                  General Comments: h/o dementia, requires max VC and hand over hand sequencing of task to execute with increased repitition. Does not initiate and execute tasks consistently/well        Exercises General Exercises - Lower Extremity Ankle Circles/Pumps: AROM, 10 reps Quad Sets: AAROM, 10 reps Short Arc Quad: AAROM, PROM, 10 reps Heel Slides: AAROM, PROM, 5 reps (with lightly resisted leg ext) Hip ABduction/ADduction: AAROM, 10 reps    General Comments General comments (skin integrity, edema, etc.): Pt confused t/o session, needing heavy and repeated cuing for all tasks      Pertinent Vitals/Pain Pain Assessment Pain Assessment: Faces Faces Pain Scale: Hurts a little bit (indicates increased pain with any L LE movements) Pain Location: L hip    Home Living                          Prior Function            PT Goals (current goals can now be found in the care plan section) Progress towards PT goals: Progressing toward goals    Frequency    BID      PT Plan Current plan remains appropriate    Co-evaluation              AM-PAC PT "6 Clicks" Mobility   Outcome Measure  Help needed turning from your back to your side while in a flat bed without using bedrails?: A Lot Help needed moving from lying on your back to sitting on the side of a flat bed without using bedrails?: Total Help needed moving to and from a bed to a chair (including a wheelchair)?: Total Help needed standing up from a chair using your arms (e.g., wheelchair or bedside chair)?: Total Help needed to walk in hospital room?: Total Help needed climbing 3-5 steps with a railing? : Total 6 Click Score: 7    End of Session Equipment Utilized During Treatment: Gait belt Activity Tolerance: Patient limited by pain;Other (comment) Patient left: in bed;with call bell/phone within reach;with bed alarm set;with family/visitor present Nurse Communication:  (need for new  purewick) PT Visit Diagnosis: Pain;Difficulty in walking, not elsewhere classified (R26.2);Muscle weakness (generalized) (M62.81);Unsteadiness on feet (R26.81);Repeated falls (R29.6);Other abnormalities of gait and mobility (R26.89) Pain - Right/Left: Left Pain - part of body: Hip     Time: 2263-3354 PT Time Calculation (min) (ACUTE ONLY): 44 min  Charges:  $Therapeutic Exercise: 8-22 mins $Therapeutic Activity: 23-37 mins                     Kreg Shropshire, DPT 03/04/2022, 12:02 PM

## 2022-03-04 NOTE — Progress Notes (Signed)
Subjective:  POD #2 s/p intramedullary fixation of left intertrochanteric hip fracture.   Patient reports left hip pain as mild to moderate.  Patient's son is at the bedside.  Objective:   VITALS:   Vitals:   03/03/22 1431 03/03/22 2044 03/04/22 0500 03/04/22 0751  BP: 125/73 (!) 122/51 114/62 (!) 123/54  Pulse: 94 86 81 76  Resp: '18 17 16   '$ Temp: 98.2 F (36.8 C) 98.9 F (37.2 C) 98.2 F (36.8 C) 98.1 F (36.7 C)  TempSrc:      SpO2: 94% 95% 97% 94%  Weight:      Height:        PHYSICAL EXAM: Left lower extremity: Neurovascular intact Sensation intact distally Intact pulses distally Dorsiflexion/Plantar flexion intact Incision: dressing C/D/I No cellulitis present Compartment soft  LABS  Results for orders placed or performed during the hospital encounter of 03/01/22 (from the past 24 hour(s))  Glucose, capillary     Status: Abnormal   Collection Time: 03/03/22  4:34 PM  Result Value Ref Range   Glucose-Capillary 165 (H) 70 - 99 mg/dL  Glucose, capillary     Status: Abnormal   Collection Time: 03/03/22  9:02 PM  Result Value Ref Range   Glucose-Capillary 172 (H) 70 - 99 mg/dL  CBC     Status: Abnormal   Collection Time: 03/04/22  5:42 AM  Result Value Ref Range   WBC 12.3 (H) 4.0 - 10.5 K/uL   RBC 3.33 (L) 3.87 - 5.11 MIL/uL   Hemoglobin 9.1 (L) 12.0 - 15.0 g/dL   HCT 28.0 (L) 36.0 - 46.0 %   MCV 84.1 80.0 - 100.0 fL   MCH 27.3 26.0 - 34.0 pg   MCHC 32.5 30.0 - 36.0 g/dL   RDW 17.2 (H) 11.5 - 15.5 %   Platelets 219 150 - 400 K/uL   nRBC 0.0 0.0 - 0.2 %  Basic metabolic panel     Status: Abnormal   Collection Time: 03/04/22  5:42 AM  Result Value Ref Range   Sodium 137 135 - 145 mmol/L   Potassium 3.3 (L) 3.5 - 5.1 mmol/L   Chloride 99 98 - 111 mmol/L   CO2 31 22 - 32 mmol/L   Glucose, Bld 121 (H) 70 - 99 mg/dL   BUN 26 (H) 8 - 23 mg/dL   Creatinine, Ser 0.76 0.44 - 1.00 mg/dL   Calcium 8.4 (L) 8.9 - 10.3 mg/dL   GFR, Estimated >60 >60 mL/min    Anion gap 7 5 - 15  Magnesium     Status: None   Collection Time: 03/04/22  5:42 AM  Result Value Ref Range   Magnesium 2.1 1.7 - 2.4 mg/dL  Glucose, capillary     Status: Abnormal   Collection Time: 03/04/22  7:58 AM  Result Value Ref Range   Glucose-Capillary 117 (H) 70 - 99 mg/dL  Glucose, capillary     Status: Abnormal   Collection Time: 03/04/22 11:39 AM  Result Value Ref Range   Glucose-Capillary 163 (H) 70 - 99 mg/dL    DG FEMUR PORT MIN 2 VIEWS LEFT  Result Date: 03/02/2022 CLINICAL DATA:  Postoperative left hip ORIF EXAM: LEFT FEMUR PORTABLE 2 VIEWS COMPARISON:  03/02/2022 intraoperative images and preoperative radiographs from 03/01/2022 FINDINGS: There has been placement of an IM nail and left hip lag screw along with 2 distal interlocking screws for fixation of the left hip intertrochanteric fracture. Near anatomic alignment has been achieved. No new periprosthetic fracture or  complicating feature is observed. IMPRESSION: 1. ORIF of the intertrochanteric fracture without complicating feature observed. Electronically Signed   By: Van Clines M.D.   On: 03/02/2022 16:44   DG HIP UNILAT WITH PELVIS 2-3 VIEWS LEFT  Result Date: 03/02/2022 CLINICAL DATA:  Left intertrochanteric femur fracture. EXAM: DG HIP (WITH OR WITHOUT PELVIS) 2-3V LEFT COMPARISON:  Left hip x-rays from yesterday. FLUOROSCOPY TIME:  Radiation Exposure Index (as provided by the fluoroscopic device): 16.0 mGy Kerma C-arm fluoroscopic images were obtained intraoperatively and submitted for post operative interpretation. FINDINGS: Multiple intraoperative fluoroscopic images demonstrate interval cephalomedullary rod fixation of left intertrochanteric femur fracture, now in near anatomic alignment. IMPRESSION: 1. Intraoperative fluoroscopic guidance for left intertrochanteric femur fracture ORIF. Electronically Signed   By: Titus Dubin M.D.   On: 03/02/2022 15:14   DG C-Arm 1-60 Min-No Report  Result Date:  03/02/2022 Fluoroscopy was utilized by the requesting physician.  No radiographic interpretation.   DG C-Arm 1-60 Min-No Report  Result Date: 03/02/2022 Fluoroscopy was utilized by the requesting physician.  No radiographic interpretation.    Assessment/Plan: 2 Days Post-Op   Principal Problem:   Closed hip fracture requiring operative repair, left, initial encounter ([place]) Active Problems:   HTN (hypertension)   Hyperlipidemia associated with type 2 diabetes mellitus ([place])   Diabetes type 2, controlled ([place])   Vitamin B12 deficiency   Dementia ([place])   Hypokalemia   Lymphedema   Hypothyroidism   Vitamin D deficiency   Acute renal insufficiency  Patient is stable postop.  Hemoglobin and hematocrit are within acceptable ranges.  Patient will continue physical therapy as tolerated.  Patient will continue Lovenox dosed per pharmacy until follow-up in the office in 10 to 14 days after discharge.  Patient will require skilled nursing facility upon discharge.    Thornton Park , MD 03/04/2022, 1:49 PM

## 2022-03-05 DIAGNOSIS — S72002A Fracture of unspecified part of neck of left femur, initial encounter for closed fracture: Secondary | ICD-10-CM | POA: Diagnosis not present

## 2022-03-05 LAB — CBC
HCT: 27.1 % — ABNORMAL LOW (ref 36.0–46.0)
Hemoglobin: 8.8 g/dL — ABNORMAL LOW (ref 12.0–15.0)
MCH: 27.6 pg (ref 26.0–34.0)
MCHC: 32.5 g/dL (ref 30.0–36.0)
MCV: 85 fL (ref 80.0–100.0)
Platelets: 227 10*3/uL (ref 150–400)
RBC: 3.19 MIL/uL — ABNORMAL LOW (ref 3.87–5.11)
RDW: 17.5 % — ABNORMAL HIGH (ref 11.5–15.5)
WBC: 11.6 10*3/uL — ABNORMAL HIGH (ref 4.0–10.5)
nRBC: 0 % (ref 0.0–0.2)

## 2022-03-05 LAB — GLUCOSE, CAPILLARY
Glucose-Capillary: 113 mg/dL — ABNORMAL HIGH (ref 70–99)
Glucose-Capillary: 160 mg/dL — ABNORMAL HIGH (ref 70–99)
Glucose-Capillary: 135 mg/dL — ABNORMAL HIGH (ref 70–99)
Glucose-Capillary: 135 mg/dL — ABNORMAL HIGH (ref 70–99)

## 2022-03-05 LAB — MAGNESIUM: Magnesium: 2.2 mg/dL (ref 1.7–2.4)

## 2022-03-05 LAB — POTASSIUM: Potassium: 3.6 mmol/L (ref 3.5–5.1)

## 2022-03-05 MED ORDER — DOCUSATE SODIUM 50 MG/5ML PO LIQD
100.0000 mg | Freq: Two times a day (BID) | ORAL | Status: DC
  Administered 2022-03-05 – 2022-03-06 (×2): 100 mg via ORAL
  Filled 2022-03-05 (×2): qty 10

## 2022-03-05 NOTE — Progress Notes (Signed)
Physical Therapy Treatment Patient Details Name: Hannah Duran MRN: [id number] DOB: [birth year] Today's Date: 03/05/2022   History of Present Illness 88 y.o. female with medical history significant of DMII, HLD, HTN, hypothyroid, dementia, lymphedema, B12 deficiency, vitamin D deficiency who is from an assisted living facility where she fell on her left side trying to get something off the floor.  Found to have a left intertrochanteric fracture requiring ORIF (IM nailing) 03/02/22.    PT Comments    Pt showed good effort with PT session but did need consistent cuing and reinforcement as she was perseverating on "don't hurt me" despite minimal indications of pain t/o session as well as relatively modest ROM/activity through the L hip.  Pt was able to stand for longer time and with decreased amount of heavy assist but is still +2 reliant and though she did manage to move each foot minimally in [place] she does not have the ability to do any true steps, etc at this time.    Recommendations for follow up therapy are one component of a multi-disciplinary discharge planning process, led by the attending physician.  Recommendations may be updated based on patient status, additional functional criteria and insurance authorization.  Follow Up Recommendations  Skilled nursing-short term rehab (<3 hours/day) Can patient physically be transported by private vehicle: No   Assistance Recommended at Discharge Intermittent Supervision/Assistance  Patient can return home with the following Two people to help with walking and/or transfers;Assistance with feeding;Assistance with cooking/housework;Assist for transportation;Two people to help with bathing/dressing/bathroom   Equipment Recommendations   (TBD at rehab)    Recommendations for Other Services       Precautions / Restrictions Precautions Precautions: Fall Restrictions Weight Bearing Restrictions: Yes LLE Weight Bearing: Weight bearing as tolerated      Mobility  Bed Mobility Overal bed mobility: Needs Assistance Bed Mobility: Sit to Supine, Supine to Sit     Supine to sit: Mod assist, Max assist, +2 for physical assistance Sit to supine: Mod assist, Max assist, +2 for physical assistance   General bed mobility comments: Pt was able to tolerate mobility better today, still minimally able to assist    Transfers Overall transfer level: Needs assistance Equipment used: Rolling walker (2 wheels) Transfers: Sit to/from Stand Sit to Stand: Mod assist, Max assist, +2 physical assistance           General transfer comment: 2 bouts of standing this date. Less assist to maintain standing, today, though still very significant.  First bout ~45 seconds with decreased tolerance and sitting bottome back and needing considerable assist. Second bout ~90 seconds with less consistent need for max assist to stay forward mostly mod with occasional min assist to keep hips forward.  Pt did manage to slightly move L toe and actaully took a small ADduction step with R when cued.  Unable to do another on either side with further encouragement/cuing.    Ambulation/Gait               General Gait Details: unable/unsafe   Stairs             Wheelchair Mobility    Modified Rankin (Stroke Patients Only)       Balance Overall balance assessment: Needs assistance Sitting-balance support: Bilateral upper extremity supported Sitting balance-Leahy Scale: Fair Sitting balance - Comments: Pt was able to maintain sitting balance w/o use of rails for modest time (30-60 sec) but did have consistent reto lean needing direct assist/cuing to address  Standing balance-Leahy Scale: Poor Standing balance comment: reliant on walker and at least some (and mostly heavy) assist to keep hips forward                            Cognition Arousal/Alertness: Awake/alert Behavior During Therapy: Anxious Overall Cognitive Status: History  of cognitive impairments - at baseline                                 General Comments: h/o dementia, requires max VC to execute tasks. Improved today but still does not initiate and execute tasks consistently/well        Exercises General Exercises - Lower Extremity Ankle Circles/Pumps: AROM, Strengthening, 15 reps Quad Sets: AROM, 10 reps Short Arc Quad: AAROM, 10 reps Heel Slides: AAROM, PROM, 5 reps (with resisted leg ext) Hip ABduction/ADduction: AAROM, 10 reps    General Comments        Pertinent Vitals/Pain Pain Assessment Pain Assessment: Faces Faces Pain Scale: Hurts a little bit Pain Location: L hip - Pt indicates little pain t/o session but perseverates on not hurting    Home Living                          Prior Function            PT Goals (current goals can now be found in the care plan section) Progress towards PT goals: Progressing toward goals (slow progress)    Frequency    BID      PT Plan Current plan remains appropriate    Co-evaluation              AM-PAC PT "6 Clicks" Mobility   Outcome Measure  Help needed turning from your back to your side while in a flat bed without using bedrails?: A Lot Help needed moving from lying on your back to sitting on the side of a flat bed without using bedrails?: Total Help needed moving to and from a bed to a chair (including a wheelchair)?: Total Help needed standing up from a chair using your arms (e.g., wheelchair or bedside chair)?: Total Help needed to walk in hospital room?: Total Help needed climbing 3-5 steps with a railing? : Total 6 Click Score: 7    End of Session Equipment Utilized During Treatment: Gait belt Activity Tolerance: Patient limited by pain;Other (comment) (cognition) Patient left: in bed;with call bell/phone within reach;with family/visitor present Nurse Communication: Mobility status PT Visit Diagnosis: Pain;Difficulty in walking, not  elsewhere classified (R26.2);Muscle weakness (generalized) (M62.81);Unsteadiness on feet (R26.81);Repeated falls (R29.6);Other abnormalities of gait and mobility (R26.89) Pain - Right/Left: Left Pain - part of body: Hip     Time: 5456-2563 PT Time Calculation (min) (ACUTE ONLY): 34 min  Charges:  $Therapeutic Exercise: 8-22 mins $Therapeutic Activity: 8-22 mins                     Kreg Shropshire, DPT 03/05/2022, 11:57 AM

## 2022-03-05 NOTE — TOC Progression Note (Addendum)
Transition of Care [facility]) - Progression Note    Patient Details  Name: Adlene Adduci MRN: [id number] Date of Birth: [birth year]  [facility]) CM/SW Contact  Eileen Stanford, LCSW Phone Number: 03/05/2022, 10:51 AM  Clinical Narrative:   Bed offers provided to Son. Son chooses [facility]. Auth started with Tammy with [employer]. [facility] notified of choice.         Expected Discharge Plan and Services                                                 Social Determinants of Health (SDOH) Interventions    Readmission Risk Interventions     No data to display

## 2022-03-05 NOTE — Progress Notes (Signed)
Subjective:  POD #3 s/p intramedullary fixation for left intertrochanteric hip fracture.   Patient reports left hip pain as mild at rest.  I reviewed the patient's PT progress note.  Patient is making steady progress.  Patient's family is at the bedside.  Objective:   VITALS:   Vitals:   03/05/22 0026 03/05/22 0446 03/05/22 0831 03/05/22 1229  BP: 130/70 130/69 132/62 128/76  Pulse: 81 79 86 87  Resp: '17 17 16 16  '$ Temp: 98.7 F (37.1 C) 98.2 F (36.8 C) (!) 97.3 F (36.3 C) 97.6 F (36.4 C)  TempSrc:      SpO2: 98% 96% 97% 98%  Weight:      Height:        PHYSICAL EXAM: Left lower extremity ABD soft Neurovascular intact Sensation intact distally Intact pulses distally Dorsiflexion/Plantar flexion intact Incision: no drainage No cellulitis present Compartment soft  LABS  Results for orders placed or performed during the hospital encounter of 03/01/22 (from the past 24 hour(s))  Glucose, capillary     Status: Abnormal   Collection Time: 03/04/22  4:54 PM  Result Value Ref Range   Glucose-Capillary 128 (H) 70 - 99 mg/dL  Glucose, capillary     Status: Abnormal   Collection Time: 03/04/22  9:41 PM  Result Value Ref Range   Glucose-Capillary 141 (H) 70 - 99 mg/dL   Comment 1 Notify RN   CBC     Status: Abnormal   Collection Time: 03/05/22  3:18 AM  Result Value Ref Range   WBC 11.6 (H) 4.0 - 10.5 K/uL   RBC 3.19 (L) 3.87 - 5.11 MIL/uL   Hemoglobin 8.8 (L) 12.0 - 15.0 g/dL   HCT 27.1 (L) 36.0 - 46.0 %   MCV 85.0 80.0 - 100.0 fL   MCH 27.6 26.0 - 34.0 pg   MCHC 32.5 30.0 - 36.0 g/dL   RDW 17.5 (H) 11.5 - 15.5 %   Platelets 227 150 - 400 K/uL   nRBC 0.0 0.0 - 0.2 %  Potassium     Status: None   Collection Time: 03/05/22  3:18 AM  Result Value Ref Range   Potassium 3.6 3.5 - 5.1 mmol/L  Magnesium     Status: None   Collection Time: 03/05/22  3:18 AM  Result Value Ref Range   Magnesium 2.2 1.7 - 2.4 mg/dL  Glucose, capillary     Status: Abnormal    Collection Time: 03/05/22  7:59 AM  Result Value Ref Range   Glucose-Capillary 113 (H) 70 - 99 mg/dL  Glucose, capillary     Status: Abnormal   Collection Time: 03/05/22 11:45 AM  Result Value Ref Range   Glucose-Capillary 160 (H) 70 - 99 mg/dL    No results found.  Assessment/Plan: 3 Days Post-Op   Principal Problem:   Closed hip fracture requiring operative repair, left, initial encounter ([place]) Active Problems:   HTN (hypertension)   Hyperlipidemia associated with type 2 diabetes mellitus ([place])   Diabetes type 2, controlled ([place])   Vitamin B12 deficiency   Dementia ([place])   Hypokalemia   Lymphedema   Hypothyroidism   Vitamin D deficiency   Acute renal insufficiency  Patient remained stable postop.  Her hemoglobin is 8.8 today.  She is afebrile with stable vital signs.  Patient will continue physical therapy as tolerated.  She is weightbearing as tolerated on the left lower extremity without restrictions.  Patient will need a skilled nursing facility upon discharge.  Patient will continue Lovenox  as dosed by the pharmacy for DVT prophylaxis which should continue at skilled nursing facility until follow-up in the orthopedic office 10 to 14 days after discharge.    Thornton Park , MD 03/05/2022, 1:56 PM

## 2022-03-05 NOTE — Progress Notes (Signed)
Progress Note    Hannah Duran  JJH:[id number] DOB: 17-Apr-1934  DOA: 03/01/2022 PCP: Abner Greenspan, MD      Brief Narrative:    Medical records reviewed and are as summarized below:  88 year old female with history of T2DM, HTN, HLD, B12 deficiency, vitamin D deficiency, low thyroid, dementia who has a left intertrochanteric fracture s/p mechanical fall at  her facility      Assessment/Plan:   Principal Problem:   Closed hip fracture requiring operative repair, left, initial encounter ([place]) Active Problems:   Hypokalemia   HTN (hypertension)   Hyperlipidemia associated with type 2 diabetes mellitus ([place])   Diabetes type 2, controlled ([place])   Vitamin B12 deficiency   Dementia ([place])   Lymphedema   Hypothyroidism   Vitamin D deficiency   Acute renal insufficiency   Body mass index is 37.59 kg/m. (Obesity)    Closed left hip fracture, s/p mechanical fall: S/p IM fixation of left hip fracture.  Continue analgesics for pain.  Continue Lovenox for DVT prophylaxis.  Outpatient follow-up with Dr. Mack Guise, orthopedic surgeon.  Acute blood loss anemia: She is asymptomatic from anemia.  H&H continues to trend downward but there is no indication for blood transfusion.  Hypokalemia, AKI: Improved  Vitamin B12 deficiency: Vitamin B12 level was 142.  Continue vitamin B12 injection  History of vitamin D deficiency: Vitamin D level was normal (31.24 ng /ml)  Dementia: Continue supportive care.  She was evaluated by the speech therapist because of concern for dysphagia and holding food in her mouth.  Regular diet was recommended.  Other comorbidities include type II DM, hypertension, hyperlipidemia  Diet Order             Diet Carb Modified Fluid consistency: Thin; Room service appropriate? Yes with Assist  Diet effective now                            Consultants: Orthopedic surgeon  Procedures: S/p IM fixation of left hip  fracture    Medications:    atorvastatin  20 mg Oral Daily   docusate sodium  100 mg Oral BID   enoxaparin (LOVENOX) injection  0.5 mg/kg Subcutaneous Q24H   ferrous sulfate  325 mg Oral TID PC   insulin aspart  0-15 Units Subcutaneous TID WC   losartan  25 mg Oral Daily   metFORMIN  500 mg Oral BID WC   QUEtiapine  25 mg Oral QHS   senna  1 tablet Oral BID   traMADol  50 mg Oral Q6H   Continuous Infusions:  methocarbamol (ROBAXIN) IV       Anti-infectives (From admission, onward)    Start     Dose/Rate Route Frequency Ordered Stop   03/02/22 2000  ceFAZolin (ANCEF) IVPB 2g/100 mL premix        2 g 200 mL/hr over 30 Minutes Intravenous Every 6 hours 03/02/22 1701 03/03/22 0217   03/02/22 1231  ceFAZolin (ANCEF) 2-4 GM/100ML-% IVPB       Note to Pharmacy: Olena Mater F: cabinet override      03/02/22 1231 03/03/22 0217   03/02/22 0600  ceFAZolin (ANCEF) IVPB 2g/100 mL premix        2 g 200 mL/hr over 30 Minutes Intravenous On call to O.R. 03/01/22 2321 03/02/22 1355              Family Communication/Anticipated D/C date and plan/Code Status  DVT prophylaxis: SCDs Start: 03/02/22 1702 Place TED hose Start: 03/02/22 1702 SCDs Start: 03/01/22 2314     Code Status: DNR  Family Communication: Discussed with Ron, son, and patient's cousin at the bedside Disposition Plan: Plan to discharge to SNF   Status is: Inpatient Remains inpatient appropriate because: Awaiting placement to SNF       Subjective:   Interval events noted.  She complains of pain after her session with the physical therapist.  Her son and her cousin were at the bedside.  Objective:    Vitals:   03/05/22 0446 03/05/22 0831 03/05/22 1229 03/05/22 1520  BP: 130/69 132/62 128/76 115/71  Pulse: 79 86 87 85  Resp: '17 16 16 16  '$ Temp: 98.2 F (36.8 C) (!) 97.3 F (36.3 C) 97.6 F (36.4 C) 98.6 F (37 C)  TempSrc:    Oral  SpO2: 96% 97% 98% 100%  Weight:      Height:        No data found.   Intake/Output Summary (Last 24 hours) at 03/05/2022 1552 Last data filed at 03/05/2022 1000 Gross per 24 hour  Intake 240 ml  Output 550 ml  Net -310 ml   Filed Weights   03/01/22 1918 03/01/22 2300  Weight: 99.8 kg 108.9 kg    Exam:  GEN: NAD SKIN: Warm and dry EYES: No pallor or icterus ENT: MMM CV: RRR PULM: CTA B ABD: soft, ND, NT, +BS CNS: AAO x 3, non focal EXT: Left hip swelling and tenderness.  Dressing on left hip surgical wound is intact and dry.      Data Reviewed:   I have personally reviewed following labs and imaging studies:  Labs: Labs show the following:   Basic Metabolic Panel: Recent Labs  Lab 03/01/22 2157 03/02/22 0320 03/02/22 1229 03/02/22 1246 03/03/22 0248 03/04/22 0542 03/05/22 0318  NA 139 138 136  --  135 137  --   K 2.9* 2.9* 3.1*   < > 3.3* 3.3* 3.6  CL 90* 93* 92*  --  98 99  --   CO2 32 32  --   --  28 31  --   GLUCOSE 159* 157* 114*  --  156* 121*  --   BUN 33* 35* 25*  --  27* 26*  --   CREATININE 1.30* 1.13* 1.20*  --  1.11* 0.76  --   CALCIUM 9.9 9.0  --   --  8.1* 8.4*  --   MG  --   --   --   --   --  2.1 2.2   < > = values in this interval not displayed.   GFR Estimated Creatinine Clearance: 65.3 mL/min (by C-G formula based on SCr of 0.76 mg/dL). Liver Function Tests: Recent Labs  Lab 03/02/22 0320  AST 25  ALT 19  ALKPHOS 90  BILITOT 0.8  PROT 6.4*  ALBUMIN 3.6   No results for input(s): "LIPASE", "AMYLASE" in the last 168 hours. No results for input(s): "AMMONIA" in the last 168 hours. Coagulation profile No results for input(s): "INR", "PROTIME" in the last 168 hours.  CBC: Recent Labs  Lab 03/01/22 2157 03/02/22 0320 03/02/22 1229 03/03/22 0248 03/04/22 0542 03/05/22 0318  WBC 21.2* 16.7*  --  14.3* 12.3* 11.6*  NEUTROABS 18.7*  --   --   --   --   --   HGB 12.4 10.6* 10.5* 9.4* 9.1* 8.8*  HCT 38.6 32.4* 31.0* 29.0* 28.0* 27.1*  MCV  84.5 83.5  --  83.8 84.1 85.0  PLT  332 286  --  251 219 227   Cardiac Enzymes: No results for input(s): "CKTOTAL", "CKMB", "CKMBINDEX", "TROPONINI" in the last 168 hours. BNP (last 3 results) No results for input(s): "PROBNP" in the last 8760 hours. CBG: Recent Labs  Lab 03/04/22 1139 03/04/22 1654 03/04/22 2141 03/05/22 0759 03/05/22 1145  GLUCAP 163* 128* 141* 113* 160*   D-Dimer: No results for input(s): "DDIMER" in the last 72 hours. Hgb A1c: No results for input(s): "HGBA1C" in the last 72 hours.  Lipid Profile: No results for input(s): "CHOL", "HDL", "LDLCALC", "TRIG", "CHOLHDL", "LDLDIRECT" in the last 72 hours. Thyroid function studies: No results for input(s): "TSH", "T4TOTAL", "T3FREE", "THYROIDAB" in the last 72 hours.  Invalid input(s): "FREET3"  Anemia work up: No results for input(s): "VITAMINB12", "FOLATE", "FERRITIN", "TIBC", "IRON", "RETICCTPCT" in the last 72 hours.  Sepsis Labs: Recent Labs  Lab 03/02/22 0320 03/03/22 0248 03/04/22 0542 03/05/22 0318  WBC 16.7* 14.3* 12.3* 11.6*    Microbiology No results found for this or any previous visit (from the past 240 hour(s)).  Procedures and diagnostic studies:  No results found.             LOS: 4 days   Lanisa Ishler  [facility]   Pager on [email or web address]. If 7PM-7AM, please contact night-coverage at [email or web address]     03/05/2022, 3:52 PM

## 2022-03-05 NOTE — Progress Notes (Signed)
Physical Therapy Treatment Patient Details Name: Hannah Duran MRN: [id number] DOB: [birth year] Today's Date: 03/05/2022   History of Present Illness 88 y.o. female with medical history significant of DMII, HLD, HTN, hypothyroid, dementia, lymphedema, B12 deficiency, vitamin D deficiency who is from an assisted living facility where she fell on her left side trying to get something off the floor.  Found to have a left intertrochanteric fracture requiring ORIF (IM nailing) 03/02/22.    PT Comments    Pt more awake this afternoon session than yesterday's PM session.  Pt still confused and perseverating on "Don't you hurt me" despite minimal indications of pain and consistently low intensity PT sessions.  Pt was able to inconsistent show some effort with heavily cued and encouraged exercises, most activities directly involving the L hip required AAROM with some increased AROM effort distally.  Pt continues to need considerable assist and reinforcement with all activity.   Recommendations for follow up therapy are one component of a multi-disciplinary discharge planning process, led by the attending physician.  Recommendations may be updated based on patient status, additional functional criteria and insurance authorization.  Follow Up Recommendations  Skilled nursing-short term rehab (<3 hours/day) Can patient physically be transported by private vehicle: No   Assistance Recommended at Discharge Frequent or constant Supervision/Assistance  Patient can return home with the following Two people to help with walking and/or transfers;Assistance with feeding;Assistance with cooking/housework;Assist for transportation;Two people to help with bathing/dressing/bathroom   Equipment Recommendations       Recommendations for Other Services       Precautions / Restrictions Precautions Precautions: Fall Restrictions LLE Weight Bearing: Weight bearing as tolerated     Mobility  Bed Mobility Overal  bed mobility: Needs Assistance Bed Mobility: Sit to Supine, Supine to Sit     Supine to sit: Mod assist, Max assist, +2 for physical assistance Sit to supine: Mod assist, Max assist, +2 for physical assistance   General bed mobility comments: deferred mobility this afternoon, pt remains inconsistent with engaging consistently and perseveration on fear of hurting    Transfers Overall transfer level: Needs assistance Equipment used: Rolling walker (2 wheels) Transfers: Sit to/from Stand Sit to Stand: Mod assist, Max assist, +2 physical assistance           General transfer comment: 2 bouts of standing this date. Less assist to maintain standing, today, though still very significant.  First bout ~45 seconds with decreased tolerance and sitting bottome back and needing considerable assist. Second bout ~90 seconds with less consistent need for max assist to stay forward mostly mod with occasional min assist to keep hips forward.  Pt did manage to slightly move L toe and actaully took a small ADduction step with R when cued.  Unable to do another on either side with further encouragement/cuing.    Ambulation/Gait               General Gait Details: unable/unsafe   Stairs             Wheelchair Mobility    Modified Rankin (Stroke Patients Only)       Balance Overall balance assessment: Needs assistance Sitting-balance support: Bilateral upper extremity supported Sitting balance-Leahy Scale: Fair Sitting balance - Comments: Pt was able to maintain sitting balance w/o use of rails for modest time (30-60 sec) but did have consistent reto lean needing direct assist/cuing to address     Standing balance-Leahy Scale: Poor Standing balance comment: reliant on walker and at least  some (and mostly heavy) assist to keep hips forward                            Cognition Arousal/Alertness: Awake/alert Behavior During Therapy: WFL for tasks  assessed/performed Overall Cognitive Status: History of cognitive impairments - at baseline                                 General Comments: h/o dementia, requires max VC to execute tasks. Improved today but still does not initiate and execute tasks consistently/well        Exercises General Exercises - Lower Extremity Ankle Circles/Pumps: Strengthening, 15 reps Quad Sets: Strengthening, 15 reps Short Arc Quad: AAROM, 15 reps, AROM, PROM (occasional partial AROM but mostly with P/AAROM) Heel Slides: AAROM, PROM, 10 reps (resisted leg ext per tolerance) Hip ABduction/ADduction: AAROM, 10 reps    General Comments        Pertinent Vitals/Pain Pain Assessment Pain Assessment: Faces Faces Pain Scale: Hurts a little bit Pain Location: L hip - only indicates minimal pain during hip movement but never appears severe, yet perseverates on hurting/not hurting    Home Living                          Prior Function            PT Goals (current goals can now be found in the care plan section) Progress towards PT goals: Progressing toward goals (very slow progression)    Frequency    BID      PT Plan Current plan remains appropriate    Co-evaluation              AM-PAC PT "6 Clicks" Mobility   Outcome Measure  Help needed turning from your back to your side while in a flat bed without using bedrails?: A Lot Help needed moving from lying on your back to sitting on the side of a flat bed without using bedrails?: Total Help needed moving to and from a bed to a chair (including a wheelchair)?: Total Help needed standing up from a chair using your arms (e.g., wheelchair or bedside chair)?: Total Help needed to walk in hospital room?: Total Help needed climbing 3-5 steps with a railing? : Total 6 Click Score: 7    End of Session Equipment Utilized During Treatment: Gait belt Activity Tolerance: Patient limited by pain;Other (comment) Patient  left: with call bell/phone within reach;with family/visitor present;with bed alarm set Nurse Communication: Mobility status PT Visit Diagnosis: Pain;Difficulty in walking, not elsewhere classified (R26.2);Muscle weakness (generalized) (M62.81);Unsteadiness on feet (R26.81);Repeated falls (R29.6);Other abnormalities of gait and mobility (R26.89) Pain - Right/Left: Left Pain - part of body: Hip     Time: 1646-1700 PT Time Calculation (min) (ACUTE ONLY): 14 min  Charges:  $Therapeutic Exercise: 8-22 mins                     Kreg Shropshire, DPT 03/05/2022, 5:36 PM

## 2022-03-05 NOTE — TOC Progression Note (Signed)
Transition of Care [facility]) - Progression Note    Patient Details  Name: Nevaya Nagele MRN: [id number] Date of Birth: [birth year]  [facility] CM/SW Contact  Eileen Stanford, LCSW Phone Number: 03/05/2022, 3:28 PM  Clinical Narrative:   As of 3:29 PM 7/6 auth still pending. MD notified. Magda Paganini with [employer].         Expected Discharge Plan and Services                                                 Social Determinants of Health (SDOH) Interventions    Readmission Risk Interventions     No data to display

## 2022-03-06 DIAGNOSIS — D518 Other vitamin B12 deficiency anemias: Secondary | ICD-10-CM | POA: Diagnosis not present

## 2022-03-06 DIAGNOSIS — E038 Other specified hypothyroidism: Secondary | ICD-10-CM | POA: Diagnosis not present

## 2022-03-06 DIAGNOSIS — F411 Generalized anxiety disorder: Secondary | ICD-10-CM | POA: Diagnosis not present

## 2022-03-06 DIAGNOSIS — I10 Essential (primary) hypertension: Secondary | ICD-10-CM | POA: Diagnosis not present

## 2022-03-06 DIAGNOSIS — S72142D Displaced intertrochanteric fracture of left femur, subsequent encounter for closed fracture with routine healing: Secondary | ICD-10-CM | POA: Diagnosis not present

## 2022-03-06 DIAGNOSIS — I890 Lymphedema, not elsewhere classified: Secondary | ICD-10-CM | POA: Diagnosis not present

## 2022-03-06 DIAGNOSIS — I959 Hypotension, unspecified: Secondary | ICD-10-CM | POA: Diagnosis not present

## 2022-03-06 DIAGNOSIS — K5289 Other specified noninfective gastroenteritis and colitis: Secondary | ICD-10-CM | POA: Diagnosis not present

## 2022-03-06 DIAGNOSIS — F0154 Vascular dementia, unspecified severity, with anxiety: Secondary | ICD-10-CM | POA: Diagnosis not present

## 2022-03-06 DIAGNOSIS — E039 Hypothyroidism, unspecified: Secondary | ICD-10-CM | POA: Diagnosis not present

## 2022-03-06 DIAGNOSIS — E7800 Pure hypercholesterolemia, unspecified: Secondary | ICD-10-CM | POA: Diagnosis not present

## 2022-03-06 DIAGNOSIS — M6281 Muscle weakness (generalized): Secondary | ICD-10-CM | POA: Diagnosis not present

## 2022-03-06 DIAGNOSIS — E782 Mixed hyperlipidemia: Secondary | ICD-10-CM | POA: Diagnosis not present

## 2022-03-06 DIAGNOSIS — E119 Type 2 diabetes mellitus without complications: Secondary | ICD-10-CM | POA: Diagnosis not present

## 2022-03-06 DIAGNOSIS — S72002F Fracture of unspecified part of neck of left femur, subsequent encounter for open fracture type IIIA, IIIB, or IIIC with routine healing: Secondary | ICD-10-CM | POA: Diagnosis not present

## 2022-03-06 DIAGNOSIS — E538 Deficiency of other specified B group vitamins: Secondary | ICD-10-CM | POA: Diagnosis not present

## 2022-03-06 DIAGNOSIS — W19XXXD Unspecified fall, subsequent encounter: Secondary | ICD-10-CM | POA: Diagnosis not present

## 2022-03-06 DIAGNOSIS — F03A4 Unspecified dementia, mild, with anxiety: Secondary | ICD-10-CM | POA: Diagnosis not present

## 2022-03-06 DIAGNOSIS — Z7984 Long term (current) use of oral hypoglycemic drugs: Secondary | ICD-10-CM | POA: Diagnosis not present

## 2022-03-06 DIAGNOSIS — S72142A Displaced intertrochanteric fracture of left femur, initial encounter for closed fracture: Secondary | ICD-10-CM | POA: Diagnosis not present

## 2022-03-06 DIAGNOSIS — F064 Anxiety disorder due to known physiological condition: Secondary | ICD-10-CM | POA: Diagnosis not present

## 2022-03-06 DIAGNOSIS — Z7401 Bed confinement status: Secondary | ICD-10-CM | POA: Diagnosis not present

## 2022-03-06 DIAGNOSIS — E876 Hypokalemia: Secondary | ICD-10-CM | POA: Diagnosis not present

## 2022-03-06 DIAGNOSIS — S72002A Fracture of unspecified part of neck of left femur, initial encounter for closed fracture: Secondary | ICD-10-CM | POA: Diagnosis not present

## 2022-03-06 DIAGNOSIS — E559 Vitamin D deficiency, unspecified: Secondary | ICD-10-CM | POA: Diagnosis not present

## 2022-03-06 LAB — BASIC METABOLIC PANEL
Anion gap: 5 (ref 5–15)
BUN: 26 mg/dL — ABNORMAL HIGH (ref 8–23)
CO2: 30 mmol/L (ref 22–32)
Calcium: 8.1 mg/dL — ABNORMAL LOW (ref 8.9–10.3)
Chloride: 101 mmol/L (ref 98–111)
Creatinine, Ser: 0.91 mg/dL (ref 0.44–1.00)
GFR, Estimated: >60 mL/min (ref >60)
Glucose, Bld: 107 mg/dL — ABNORMAL HIGH (ref 70–99)
Potassium: 3.9 mmol/L (ref 3.5–5.1)
Sodium: 136 mmol/L (ref 135–145)

## 2022-03-06 LAB — CBC
HCT: 26.7 % — ABNORMAL LOW (ref 36.0–46.0)
Hemoglobin: 8.7 g/dL — ABNORMAL LOW (ref 12.0–15.0)
MCH: 27.4 pg (ref 26.0–34.0)
MCHC: 32.6 g/dL (ref 30.0–36.0)
MCV: 84.2 fL (ref 80.0–100.0)
Platelets: 275 10*3/uL (ref 150–400)
RBC: 3.17 MIL/uL — ABNORMAL LOW (ref 3.87–5.11)
RDW: 17.2 % — ABNORMAL HIGH (ref 11.5–15.5)
WBC: 10.7 10*3/uL — ABNORMAL HIGH (ref 4.0–10.5)
nRBC: 0 % (ref 0.0–0.2)

## 2022-03-06 LAB — GLUCOSE, CAPILLARY
Glucose-Capillary: 123 mg/dL — ABNORMAL HIGH (ref 70–99)
Glucose-Capillary: 98 mg/dL (ref 70–99)

## 2022-03-06 MED ORDER — VITAMIN B-12 1000 MCG PO TABS: 1000.0000 ug | ORAL_TABLET | Freq: Every day | ORAL | Status: DC

## 2022-03-06 MED ORDER — ACETAMINOPHEN 325 MG PO TABS: 650.0000 mg | ORAL_TABLET | Freq: Four times a day (QID) | ORAL | Status: DC | PRN

## 2022-03-06 MED ORDER — POTASSIUM CHLORIDE ER 20 MEQ PO TBCR: 20.0000 meq | EXTENDED_RELEASE_TABLET | Freq: Every day | ORAL | Status: DC

## 2022-03-06 MED ORDER — FERROUS SULFATE 325 (65 FE) MG PO TABS: 325.0000 mg | ORAL_TABLET | ORAL | Status: DC

## 2022-03-06 MED ORDER — ENOXAPARIN SODIUM 60 MG/0.6ML IJ SOSY: 0.5000 mg/kg | PREFILLED_SYRINGE | INTRAMUSCULAR | Status: AC

## 2022-03-06 MED ORDER — TORSEMIDE 20 MG PO TABS: 20.0000 mg | ORAL_TABLET | Freq: Two times a day (BID) | ORAL | Status: DC

## 2022-03-06 NOTE — Care Management Important Message (Signed)
Important Message  Patient Details  Name: Hannah Duran MRN: [id number] Date of Birth: November 29, 1933   Medicare Important Message Given:  Yes     Juliann Pulse A Keighley Deckman 03/06/2022, 11:05 AM

## 2022-03-06 NOTE — Discharge Summary (Addendum)
Physician Discharge Summary   Patient: Hannah Duran MRN: [id number] DOB: 05/30/1934  Admit date:     03/01/2022  Discharge date: 03/06/22  Discharge Physician: Jennye Boroughs   PCP: Abner Greenspan, MD   Recommendations at discharge:     Discharge Diagnoses: Principal Problem:   Closed hip fracture requiring operative repair, left, initial encounter [facility]) Active Problems:   Hypokalemia   HTN (hypertension)   Hyperlipidemia associated with type 2 diabetes mellitus ([place])   Diabetes type 2, controlled ([place])   Vitamin B12 deficiency   Dementia ([place])   Lymphedema   Hypothyroidism   Vitamin D deficiency   Acute renal insufficiency  Resolved Problems:   Other vitamin B12 deficiency anemias  Hospital Course:  Hannah Duran is an 88 year old woman with medical history significant for type 2 diabetes mellitus, hypertension, hyperlipidemia, vitamin B12 deficiency, vitamin D deficiency, hypothyroidism, dementia, who was brought to the hospital because of severe left hip pain following a mechanical fall.  She was found to have left hip fracture.  She was treated with analgesics.  She underwent IM fixation of left hip fracture on 03/02/2022 (performed by Dr. Mack Guise).   She developed acute blood loss anemia but she did not require any blood transfusion.  She had hypokalemia that was repleted.  She also had mild AKI that improved with IV fluids.  Vitamin B12 level is low (142) so she was started on vitamin B12 injections.  PCP can arrange for vitamin B12 injections in the outpatient setting.  She was evaluated by PT and OT who recommended further rehabilitation at a skilled nursing facility.  Her condition has improved and she is deemed stable for discharge to SNF today.  Discharge plan was discussed with the patient and Hannah Duran, son, at the bedside.      Consultants: Orthopedic surgeon, Dr. Mack Guise Procedures performed: IM fixation of left hip fracture Disposition: Skilled nursing  facility Diet recommendation:  Discharge Diet Orders (From admission, onward)     Start     Ordered   03/06/22 0000  Diet - low sodium heart healthy        03/06/22 1111           Cardiac and Carb modified diet DISCHARGE MEDICATION: Allergies as of 03/06/2022       Reactions   Penicillins    REACTION: Rash   Sulfamethoxazole-trimethoprim    rash   Amoxicillin Rash        Medication List     STOP taking these medications    torsemide 20 MG tablet Commonly known as: DEMADEX       TAKE these medications    acetaminophen 325 MG tablet Commonly known as: TYLENOL Take 2 tablets (650 mg total) by mouth every 6 (six) hours as needed for mild pain (or Fever >/= 101).   albuterol 108 (90 Base) MCG/ACT inhaler Commonly known as: VENTOLIN HFA Inhale 2 puffs into the lungs every 4 (four) hours as needed for wheezing.   atorvastatin 20 MG tablet Commonly known as: LIPITOR Take 1 tablet (20 mg total) by mouth daily.   enoxaparin 60 MG/0.6ML injection Commonly known as: LOVENOX Inject 0.55 mLs (55 mg total) into the skin daily for 13 days. Start taking on: March 07, 2022   ferrous sulfate 325 (65 FE) MG tablet Take 1 tablet (325 mg total) by mouth every other day.   furosemide 20 MG tablet Commonly known as: LASIX Take 1 tablet (20 mg total) by mouth daily.  losartan 25 MG tablet Commonly known as: COZAAR Take 1 tablet (25 mg total) by mouth daily.   metFORMIN 500 MG tablet Commonly known as: GLUCOPHAGE Take 1 tablet (500 mg total) by mouth 2 (two) times daily with a meal.   metolazone 2.5 MG tablet Commonly known as: ZAROXOLYN Take 2.5 mg by mouth daily.   nystatin powder Generic drug: nystatin SMARTSIG:1 Application Topical 2-3 Times Daily   Potassium Chloride ER 20 MEQ Tbcr Take 20 mEq by mouth daily. Take one pill by mouth once weekly when you take your furosemide What changed:  medication strength how much to take   QUEtiapine 25 MG  tablet Commonly known as: SEROquel Take 1 tablet (25 mg total) by mouth at bedtime.   vitamin B-12 1000 MCG tablet Commonly known as: CYANOCOBALAMIN Take 1 tablet (1,000 mcg total) by mouth daily.               Discharge Care Instructions  (From admission, onward)           Start     Ordered   03/06/22 0000  Discharge wound care:       Comments: Follow up with Dr. Mack Guise, orthopedic surgeon, for wound care   03/06/22 1111            Contact information for follow-up providers     Hannah Park, MD. Schedule an appointment as soon as possible for a visit in 2 week(s).   Specialty: Orthopedic Surgery Contact information: [place] [id number] [phone]              Contact information for after-discharge care     [facility] Preferred SNF .   Service: Skilled Nursing Contact information: [place] [phone]                    Discharge Exam: Danley Danker Weights   03/01/22 1918 03/01/22 2300  Weight: 99.8 kg 108.9 kg   GEN: NAD SKIN: Warm and dry EYES: No pallor or icterus ENT: MMM CV: RRR PULM: CTA B ABD: soft, ND, NT, +BS CNS: AAO x 1 (person), non focal EXT: Left hip swelling and tenderness.  Dressing on left hip surgical wound is dry and intact  Condition at discharge: good  The results of significant diagnostics from this hospitalization (including imaging, microbiology, ancillary and laboratory) are listed below for reference.   Imaging Studies: DG FEMUR PORT MIN 2 VIEWS LEFT  Result Date: 03/02/2022 CLINICAL DATA:  Postoperative left hip ORIF EXAM: LEFT FEMUR PORTABLE 2 VIEWS COMPARISON:  03/02/2022 intraoperative images and preoperative radiographs from 03/01/2022 FINDINGS: There has been placement of an IM nail and left hip lag screw along with 2 distal  interlocking screws for fixation of the left hip intertrochanteric fracture. Near anatomic alignment has been achieved. No new periprosthetic fracture or complicating feature is observed. IMPRESSION: 1. ORIF of the intertrochanteric fracture without complicating feature observed. Electronically Signed   By: Van Clines M.D.   On: 03/02/2022 16:44   DG HIP UNILAT WITH PELVIS 2-3 VIEWS LEFT  Result Date: 03/02/2022 CLINICAL DATA:  Left intertrochanteric femur fracture. EXAM: DG HIP (WITH OR WITHOUT PELVIS) 2-3V LEFT COMPARISON:  Left hip x-rays from yesterday. FLUOROSCOPY TIME:  Radiation Exposure Index (as provided by the fluoroscopic device): 16.0 mGy Kerma C-arm fluoroscopic images were obtained intraoperatively and submitted for post operative  interpretation. FINDINGS: Multiple intraoperative fluoroscopic images demonstrate interval cephalomedullary rod fixation of left intertrochanteric femur fracture, now in near anatomic alignment. IMPRESSION: 1. Intraoperative fluoroscopic guidance for left intertrochanteric femur fracture ORIF. Electronically Signed   By: Titus Dubin M.D.   On: 03/02/2022 15:14   DG C-Arm 1-60 Min-No Report  Result Date: 03/02/2022 Fluoroscopy was utilized by the requesting physician.  No radiographic interpretation.   DG C-Arm 1-60 Min-No Report  Result Date: 03/02/2022 Fluoroscopy was utilized by the requesting physician.  No radiographic interpretation.   DG Chest 1 View  Result Date: 03/01/2022 CLINICAL DATA:  Known left hip fracture EXAM: CHEST  1 VIEW COMPARISON:  02/26/2021 FINDINGS: Cardiac shadow is enlarged. Aortic calcifications are again noted. Lungs are well aerated with minimal left basilar scarring stable from the prior exam. No bony abnormality is seen. IMPRESSION: No acute abnormality noted. Electronically Signed   By: Inez Catalina M.D.   On: 03/01/2022 20:37   DG Hip Unilat With Pelvis 2-3 Views Left  Result Date: 03/01/2022 CLINICAL DATA:  Recent  fall with left hip pain, initial encounter EXAM: DG HIP (WITH OR WITHOUT PELVIS) 3V LEFT COMPARISON:  02/26/2021 FINDINGS: Pelvic ring is intact. Comminuted intratrochanteric fracture is noted in the left femur with impaction and angulation at the fracture site. No soft tissue abnormality is seen. IMPRESSION: Comminuted left intratrochanteric fracture. Electronically Signed   By: Inez Catalina M.D.   On: 03/01/2022 20:37    Microbiology: Results for orders placed or performed during the hospital encounter of 03/28/21  Resp Panel by RT-PCR (Flu A&B, Covid) Nasopharyngeal Swab     Status: None   Collection Time: 03/28/21 10:08 AM   Specimen: Nasopharyngeal Swab; Nasopharyngeal(NP) swabs in vial transport medium  Result Value Ref Range Status   SARS Coronavirus 2 by RT PCR NEGATIVE NEGATIVE Final    Comment: (NOTE) SARS-CoV-2 target nucleic acids are NOT DETECTED.  The SARS-CoV-2 RNA is generally detectable in upper respiratory specimens during the acute phase of infection. The lowest concentration of SARS-CoV-2 viral copies this assay can detect is 138 copies/mL. A negative result does not preclude SARS-Cov-2 infection and should not be used as the sole basis for treatment or other patient management decisions. A negative result may occur with  improper specimen collection/handling, submission of specimen other than nasopharyngeal swab, presence of viral mutation(s) within the areas targeted by this assay, and inadequate number of viral copies(<138 copies/mL). A negative result must be combined with clinical observations, patient history, and epidemiological information. The expected result is Negative.  Fact Sheet for Patients:  EntrepreneurPulse.com.au  Fact Sheet for Healthcare Providers:  IncredibleEmployment.be  This test is no t yet approved or cleared by the [place] FDA and  has been authorized for detection and/or diagnosis of SARS-CoV-2  by FDA under an Emergency Use Authorization (EUA). This EUA will remain  in effect (meaning this test can be used) for the duration of the COVID-19 declaration under Section 564(b)(1) of the Act, 21 U.S.C.section 360bbb-3(b)(1), unless the authorization is terminated  or revoked sooner.       Influenza A by PCR NEGATIVE NEGATIVE Final   Influenza B by PCR NEGATIVE NEGATIVE Final    Comment: (NOTE) The Xpert Xpress SARS-CoV-2/FLU/RSV plus assay is intended as an aid in the diagnosis of influenza from Nasopharyngeal swab specimens and should not be used as a sole basis for treatment. Nasal washings and aspirates are unacceptable for Xpert Xpress SARS-CoV-2/FLU/RSV testing.  Fact Sheet for Patients: EntrepreneurPulse.com.au  Fact Sheet for Healthcare Providers: IncredibleEmployment.be  This test is not yet approved or cleared by the [place] FDA and has been authorized for detection and/or diagnosis of SARS-CoV-2 by FDA under an Emergency Use Authorization (EUA). This EUA will remain in effect (meaning this test can be used) for the duration of the COVID-19 declaration under Section 564(b)(1) of the Act, 21 U.S.C. section 360bbb-3(b)(1), unless the authorization is terminated or revoked.  Performed at [facility], [address] [place], [place] [address]   Urine Culture     Status: Abnormal   Collection Time: 03/28/21 10:57 AM   Specimen: Urine, Random  Result Value Ref Range Status   Specimen Description   Final    URINE, RANDOM Performed at [facility], [address] [place], [place] [address]    Special Requests   Final    NONE Performed at [facility], [address] [place], [place] [address]    Culture (A)  Final    >=100,000 COLONIES/mL KLEBSIELLA PNEUMONIAE >=100,000 COLONIES/mL AEROCOCCUS URINAE Standardized susceptibility testing for this organism is not available. Performed at  [facility], [place] [address], [place], [place] [address]    Report Status 03/31/2021 FINAL  Final   Organism ID, Bacteria KLEBSIELLA PNEUMONIAE (A)  Final      Susceptibility   Klebsiella pneumoniae - MIC*    AMPICILLIN >=32 RESISTANT Resistant     CEFAZOLIN <=4 SENSITIVE Sensitive     CEFEPIME <=0.12 SENSITIVE Sensitive     CEFTRIAXONE <=0.25 SENSITIVE Sensitive     CIPROFLOXACIN <=0.25 SENSITIVE Sensitive     GENTAMICIN <=1 SENSITIVE Sensitive     IMIPENEM <=0.25 SENSITIVE Sensitive     NITROFURANTOIN 64 INTERMEDIATE Intermediate     TRIMETH/SULFA <=20 SENSITIVE Sensitive     AMPICILLIN/SULBACTAM 8 SENSITIVE Sensitive     PIP/TAZO 8 SENSITIVE Sensitive     * >=100,000 COLONIES/mL KLEBSIELLA PNEUMONIAE    Labs: CBC: Recent Labs  Lab 03/01/22 2157 03/02/22 0320 03/02/22 1229 03/03/22 0248 03/04/22 0542 03/05/22 0318 03/06/22 0327  WBC 21.2* 16.7*  --  14.3* 12.3* 11.6* 10.7*  NEUTROABS 18.7*  --   --   --   --   --   --   HGB 12.4 10.6* 10.5* 9.4* 9.1* 8.8* 8.7*  HCT 38.6 32.4* 31.0* 29.0* 28.0* 27.1* 26.7*  MCV 84.5 83.5  --  83.8 84.1 85.0 84.2  PLT 332 286  --  251 219 227 299   Basic Metabolic Panel: Recent Labs  Lab 03/01/22 2157 03/02/22 0320 03/02/22 1229 03/02/22 1246 03/03/22 0248 03/04/22 0542 03/05/22 0318 03/06/22 0327  NA 139 138 136  --  135 137  --  136  K 2.9* 2.9* 3.1* 3.2* 3.3* 3.3* 3.6 3.9  CL 90* 93* 92*  --  98 99  --  101  CO2 32 32  --   --  28 31  --  30  GLUCOSE 159* 157* 114*  --  156* 121*  --  107*  BUN 33* 35* 25*  --  27* 26*  --  26*  CREATININE 1.30* 1.13* 1.20*  --  1.11* 0.76  --  0.91  CALCIUM 9.9 9.0  --   --  8.1* 8.4*  --  8.1*  MG  --   --   --   --   --  2.1 2.2  --    Liver Function Tests: Recent Labs  Lab 03/02/22 0320  AST 25  ALT 19  ALKPHOS 90  BILITOT 0.8  PROT 6.4*  ALBUMIN 3.6   CBG: Recent Labs  Lab 03/05/22 1145 03/05/22 1645 03/05/22 2051 03/06/22 0729 03/06/22 1158  GLUCAP 160* 135*  135* 123* 98    Discharge time spent: greater than 30 minutes.  Signed: Jennye Boroughs, MD [facility] 03/06/2022

## 2022-03-06 NOTE — Progress Notes (Signed)
Discharge paperwork in package, IV discontinue. Pt. Peri care provided and place in discharge gown.

## 2022-03-06 NOTE — TOC Progression Note (Addendum)
Transition of Care [facility]) - Progression Note    Patient Details  Name: Hannah Duran MRN: [id number] Date of Birth: 10/07/1933  [facility]) CM/SW [place], LCSW Phone Number: 03/06/2022, 9:03 AM  Clinical Narrative:    Patient has [name] for [facility]. SNF auth [id number]. [name] [id number]. CSW left message for Magda Paganini at [facility] to inquire whether they have a bed today, waiting to hear back.   9:34- Magda Paganini at [facility] can take patient today, room 508. Awaiting DC Summary. Notified MD and RN.        Expected Discharge Plan and Services                                                 Social Determinants of Health (SDOH) Interventions    Readmission Risk Interventions     No data to display

## 2022-03-06 NOTE — Plan of Care (Signed)
  Problem: Safety: Goal: Ability to remain free from injury will improve Outcome: Progressing   

## 2022-03-06 NOTE — Progress Notes (Signed)
Subjective:  POD #4 s/p intramedullary fixation for left intertrochanteric hip fracture.   Patient reports left hip pain as mild.    Objective:   VITALS:   Vitals:   03/05/22 2020 03/05/22 2316 03/06/22 0342 03/06/22 0832  BP: 140/60 113/75 116/67 125/60  Pulse: (!) 52 84 85 77  Resp: '15 17 17 18  '$ Temp: 98.2 F (36.8 C) 98.6 F (37 C) 98.2 F (36.8 C) 98.6 F (37 C)  TempSrc:    Oral  SpO2: 93% 96% 95% 96%  Weight:      Height:        PHYSICAL EXAM: Left lower extremity Neurovascular intact Sensation intact distally Intact pulses distally Dorsiflexion/Plantar flexion intact Incision: scant drainage No cellulitis present Compartment soft  LABS  Results for orders placed or performed during the hospital encounter of 03/01/22 (from the past 24 hour(s))  Glucose, capillary     Status: Abnormal   Collection Time: 03/05/22  4:45 PM  Result Value Ref Range   Glucose-Capillary 135 (H) 70 - 99 mg/dL  Glucose, capillary     Status: Abnormal   Collection Time: 03/05/22  8:51 PM  Result Value Ref Range   Glucose-Capillary 135 (H) 70 - 99 mg/dL  CBC     Status: Abnormal   Collection Time: 03/06/22  3:27 AM  Result Value Ref Range   WBC 10.7 (H) 4.0 - 10.5 K/uL   RBC 3.17 (L) 3.87 - 5.11 MIL/uL   Hemoglobin 8.7 (L) 12.0 - 15.0 g/dL   HCT 26.7 (L) 36.0 - 46.0 %   MCV 84.2 80.0 - 100.0 fL   MCH 27.4 26.0 - 34.0 pg   MCHC 32.6 30.0 - 36.0 g/dL   RDW 17.2 (H) 11.5 - 15.5 %   Platelets 275 150 - 400 K/uL   nRBC 0.0 0.0 - 0.2 %  Basic metabolic panel     Status: Abnormal   Collection Time: 03/06/22  3:27 AM  Result Value Ref Range   Sodium 136 135 - 145 mmol/L   Potassium 3.9 3.5 - 5.1 mmol/L   Chloride 101 98 - 111 mmol/L   CO2 30 22 - 32 mmol/L   Glucose, Bld 107 (H) 70 - 99 mg/dL   BUN 26 (H) 8 - 23 mg/dL   Creatinine, Ser 0.91 0.44 - 1.00 mg/dL   Calcium 8.1 (L) 8.9 - 10.3 mg/dL   GFR, Estimated >60 >60 mL/min   Anion gap 5 5 - 15  Glucose, capillary      Status: Abnormal   Collection Time: 03/06/22  7:29 AM  Result Value Ref Range   Glucose-Capillary 123 (H) 70 - 99 mg/dL   Comment 1 Notify RN   Glucose, capillary     Status: None   Collection Time: 03/06/22 11:58 AM  Result Value Ref Range   Glucose-Capillary 98 70 - 99 mg/dL    No results found.  Assessment/Plan: 4 Days Post-Op   Principal Problem:   Closed hip fracture requiring operative repair, left, initial encounter ([place]) Active Problems:   HTN (hypertension)   Hyperlipidemia associated with type 2 diabetes mellitus ([place])   Diabetes type 2, controlled ([place])   Vitamin B12 deficiency   Dementia ([place])   Hypokalemia   Lymphedema   Hypothyroidism   Vitamin D deficiency   Acute renal insufficiency  Patient remained stable postop.  Patient will continue physical therapy as tolerated.  She is weightbearing as tolerated on the left lower extremity without restrictions.  Patient will need  a skilled nursing facility upon discharge.  Patient will continue Lovenox as dosed by the pharmacy for DVT prophylaxis which should continue at skilled nursing facility until follow-up in the orthopedic office 10 to 14 days after discharge.    Thornton Park , MD 03/06/2022, 2:08 PM

## 2022-03-06 NOTE — TOC Transition Note (Addendum)
Transition of Care [facility]) - CM/SW Discharge Note   Patient Details  Name: Hannah Duran MRN: [id number] Date of Birth: [birth year]  [facility]) CM/SW Contact:  Magnus Ivan, LCSW Phone Number: 03/06/2022, 9:38 AM   Clinical Narrative:    Discharge to [facility] today. Room 508. Confirmed with Admissions Worker Magda Paganini. Updated MD, RN, and patient's son Chriss Czar). Asked RN to call report once DC Summary is done. EMS paperwork completed. [facility] will be called once DC Summary/report are complete and RN is ready for transport.  12:00- [facility] called for transport. Patient is 2nd on the list. RN notified.   Final next level of care: Skilled Nursing Facility Barriers to Discharge: Barriers Resolved   Patient Goals and CMS Choice Patient states their goals for this hospitalization and ongoing recovery are:: to go to [employer].gov Compare Post Acute Care list provided to:: Patient Represenative (must comment) Choice offered to / list presented to : Adult Children  Discharge Placement              Patient [facility] bed at: [facility] Patient to be transferred to facility by: [facility] Name of family member notified: Ron - son Patient and family notified of of transfer: 03/06/22  Discharge Plan and Services                                     Social Determinants of Health (SDOH) Interventions     Readmission Risk Interventions     No data to display

## 2022-03-06 NOTE — Progress Notes (Signed)
Report called and given to [employer] Engineer, civil (consulting) of [facility])

## 2022-03-06 NOTE — Progress Notes (Signed)
Physical Therapy Treatment Patient Details Name: Hannah Duran MRN: [id number] DOB: 1934-07-03 Today's Date: 03/06/2022   History of Present Illness 88 y.o. female with medical history significant of DMII, HLD, HTN, hypothyroid, dementia, lymphedema, B12 deficiency, vitamin D deficiency who is from an assisted living facility where she fell on her left side trying to get something off the floor.  Found to have a left intertrochanteric fracture requiring ORIF (IM nailing) 03/02/22.    PT Comments    Pt seen for PT tx with pt requiring encouragement for participation. Pt requires max/total +1-2 assist for bed mobility & max/total +2 assist for STS attempts EOB with pt requiring max multimodal cuing for anterior lean but pt resistant. PT able to assist pt with clearing buttocks from EOB but pt not participating in STS. Pt keeps mouth closed, not speaking, throughout session but then spits up mid session - nurse notified. Pt then verbally engaging with PT & stating no she wouldn't attempt various tasks. Assisted pt back to bed & PT performed LLE PROM exercises (pt unwilling to participate despite encouragement). Due to pt's impaired cognition & decreased motivation/willingness to participate, will change frequency to QD.     Recommendations for follow up therapy are one component of a multi-disciplinary discharge planning process, led by the attending physician.  Recommendations may be updated based on patient status, additional functional criteria and insurance authorization.  Follow Up Recommendations  Skilled nursing-short term rehab (<3 hours/day) Can patient physically be transported by private vehicle: No   Assistance Recommended at Discharge Frequent or constant Supervision/Assistance  Patient can return home with the following Two people to help with walking and/or transfers;Assistance with feeding;Assistance with cooking/housework;Assist for transportation;Two people to help with  bathing/dressing/bathroom   Equipment Recommendations  None recommended by PT    Recommendations for Other Services       Precautions / Restrictions Precautions Precautions: Fall Restrictions Weight Bearing Restrictions: Yes LLE Weight Bearing: Weight bearing as tolerated     Mobility  Bed Mobility Overal bed mobility: Needs Assistance Bed Mobility: Sit to Supine, Supine to Sit     Supine to sit: Max assist, Total assist, HOB elevated Sit to supine: Max assist, +2 for physical assistance   General bed mobility comments: Pt requires MAX/total assist for supine>sit & max +2 for sit>supine with HOB elevated & bed rails, pt with little effort in mobility    Transfers Overall transfer level: Needs assistance Equipment used: Rolling walker (2 wheels) Transfers: Sit to/from Stand Sit to Stand: Max assist, Total assist, +2 physical assistance           General transfer comment: 2-3 attempts of STS from EOB with pt with minimal to no participation, eventually even stating "no" she wouldn't do it    Ambulation/Gait                   Stairs             Wheelchair Mobility    Modified Rankin (Stroke Patients Only)       Balance Overall balance assessment: Needs assistance Sitting-balance support: Bilateral upper extremity supported, Feet supported Sitting balance-Leahy Scale: Fair   Postural control: Posterior lean Standing balance support: During functional activity, Reliant on assistive device for balance Standing balance-Leahy Scale: Zero                              Cognition Arousal/Alertness: Awake/alert Behavior During Therapy: [facility] for  tasks assessed/performed Overall Cognitive Status: History of cognitive impairments - at baseline                                 General Comments: hx of dementia, requries multimodal cuing with pt inconsistently following simple commands, not very engaged in session         Exercises General Exercises - Lower Extremity Ankle Circles/Pumps: PROM, Left, 5 reps, Supine Heel Slides: PROM, Left, 5 reps, Supine Hip ABduction/ADduction: PROM, Supine, Left, 5 reps Straight Leg Raises: PROM, Supine, Left, 5 reps    General Comments        Pertinent Vitals/Pain Pain Assessment Pain Assessment: Faces Faces Pain Scale: Hurts even more Pain Location: LLE with movement/weight bearing Pain Descriptors / Indicators: Discomfort, Grimacing Pain Intervention(s): Monitored during session, Repositioned    Home Living                          Prior Function            PT Goals (current goals can now be found in the care plan section) Acute Rehab PT Goals Patient Stated Goal: Return to ALF and be safe in this setting and for hip pain to go down PT Goal Formulation: With patient Time For Goal Achievement: 03/17/22 Potential to Achieve Goals: Good Progress towards PT goals: Progressing toward goals    Frequency    7X/week      PT Plan Frequency needs to be updated    Co-evaluation              AM-PAC PT "6 Clicks" Mobility   Outcome Measure  Help needed turning from your back to your side while in a flat bed without using bedrails?: Total Help needed moving from lying on your back to sitting on the side of a flat bed without using bedrails?: Total Help needed moving to and from a bed to a chair (including a wheelchair)?: Total Help needed standing up from a chair using your arms (e.g., wheelchair or bedside chair)?: Total Help needed to walk in hospital room?: Total Help needed climbing 3-5 steps with a railing? : Total 6 Click Score: 6    End of Session Equipment Utilized During Treatment: Gait belt Activity Tolerance: Patient limited by pain (pt self limiting) Patient left: in bed;with call bell/phone within reach;with bed alarm set;with family/visitor present Nurse Communication: Mobility status PT Visit Diagnosis:  Pain;Difficulty in walking, not elsewhere classified (R26.2);Muscle weakness (generalized) (M62.81);Unsteadiness on feet (R26.81);Repeated falls (R29.6);Other abnormalities of gait and mobility (R26.89) Pain - Right/Left: Left Pain - part of body: Hip     Time: 0922-0945 PT Time Calculation (min) (ACUTE ONLY): 23 min  Charges:  $Therapeutic Activity: 23-37 mins                     Lavone Nian, PT, DPT 03/06/22, 11:57 AM  Waunita Schooner 03/06/2022, 11:57 AM

## 2022-03-08 NOTE — Anesthesia Postprocedure Evaluation (Signed)
Anesthesia Post Note  Patient: Hannah Duran  Procedure(s) Performed: INTRAMEDULLARY (IM) NAIL INTERTROCHANTRIC (Left)  Patient location during evaluation: PACU Anesthesia Type: General Level of consciousness: awake and alert Pain management: pain level controlled Vital Signs Assessment: post-procedure vital signs reviewed and stable Respiratory status: spontaneous breathing, nonlabored ventilation, respiratory function stable and patient connected to nasal cannula oxygen Cardiovascular status: blood pressure returned to baseline and stable Postop Assessment: no apparent nausea or vomiting Anesthetic complications: no   No notable events documented.   Last Vitals:  Vitals:   03/06/22 0342 03/06/22 0832  BP: 116/67 125/60  Pulse: 85 77  Resp: 17 18  Temp: 36.8 C 37 C  SpO2: 95% 96%    Last Pain:  Vitals:   03/06/22 0927  TempSrc:   PainSc: 0-No pain                 Martha Clan

## 2022-03-09 DIAGNOSIS — E119 Type 2 diabetes mellitus without complications: Secondary | ICD-10-CM | POA: Diagnosis not present

## 2022-03-09 DIAGNOSIS — E039 Hypothyroidism, unspecified: Secondary | ICD-10-CM | POA: Diagnosis not present

## 2022-03-09 DIAGNOSIS — I10 Essential (primary) hypertension: Secondary | ICD-10-CM | POA: Diagnosis not present

## 2022-03-09 DIAGNOSIS — D518 Other vitamin B12 deficiency anemias: Secondary | ICD-10-CM | POA: Diagnosis not present

## 2022-03-09 DIAGNOSIS — F064 Anxiety disorder due to known physiological condition: Secondary | ICD-10-CM | POA: Diagnosis not present

## 2022-03-09 DIAGNOSIS — E782 Mixed hyperlipidemia: Secondary | ICD-10-CM | POA: Diagnosis not present

## 2022-03-09 DIAGNOSIS — S72002F Fracture of unspecified part of neck of left femur, subsequent encounter for open fracture type IIIA, IIIB, or IIIC with routine healing: Secondary | ICD-10-CM | POA: Diagnosis not present

## 2022-03-09 DIAGNOSIS — E876 Hypokalemia: Secondary | ICD-10-CM | POA: Diagnosis not present

## 2022-03-09 DIAGNOSIS — M6281 Muscle weakness (generalized): Secondary | ICD-10-CM | POA: Diagnosis not present

## 2022-03-10 DIAGNOSIS — E119 Type 2 diabetes mellitus without complications: Secondary | ICD-10-CM | POA: Diagnosis not present

## 2022-03-10 DIAGNOSIS — S72002F Fracture of unspecified part of neck of left femur, subsequent encounter for open fracture type IIIA, IIIB, or IIIC with routine healing: Secondary | ICD-10-CM | POA: Diagnosis not present

## 2022-03-10 DIAGNOSIS — D518 Other vitamin B12 deficiency anemias: Secondary | ICD-10-CM | POA: Diagnosis not present

## 2022-03-10 DIAGNOSIS — F064 Anxiety disorder due to known physiological condition: Secondary | ICD-10-CM | POA: Diagnosis not present

## 2022-03-10 DIAGNOSIS — I890 Lymphedema, not elsewhere classified: Secondary | ICD-10-CM | POA: Diagnosis not present

## 2022-03-10 DIAGNOSIS — M6281 Muscle weakness (generalized): Secondary | ICD-10-CM | POA: Diagnosis not present

## 2022-03-10 DIAGNOSIS — I10 Essential (primary) hypertension: Secondary | ICD-10-CM | POA: Diagnosis not present

## 2022-03-10 DIAGNOSIS — K5289 Other specified noninfective gastroenteritis and colitis: Secondary | ICD-10-CM | POA: Diagnosis not present

## 2022-03-10 DIAGNOSIS — E782 Mixed hyperlipidemia: Secondary | ICD-10-CM | POA: Diagnosis not present

## 2022-03-10 DIAGNOSIS — E038 Other specified hypothyroidism: Secondary | ICD-10-CM | POA: Diagnosis not present

## 2022-03-10 DIAGNOSIS — E876 Hypokalemia: Secondary | ICD-10-CM | POA: Diagnosis not present

## 2022-03-13 DIAGNOSIS — D518 Other vitamin B12 deficiency anemias: Secondary | ICD-10-CM | POA: Diagnosis not present

## 2022-03-13 DIAGNOSIS — E782 Mixed hyperlipidemia: Secondary | ICD-10-CM | POA: Diagnosis not present

## 2022-03-13 DIAGNOSIS — E119 Type 2 diabetes mellitus without complications: Secondary | ICD-10-CM | POA: Diagnosis not present

## 2022-03-13 DIAGNOSIS — I10 Essential (primary) hypertension: Secondary | ICD-10-CM | POA: Diagnosis not present

## 2022-03-13 DIAGNOSIS — E876 Hypokalemia: Secondary | ICD-10-CM | POA: Diagnosis not present

## 2022-03-13 DIAGNOSIS — M6281 Muscle weakness (generalized): Secondary | ICD-10-CM | POA: Diagnosis not present

## 2022-03-13 DIAGNOSIS — E039 Hypothyroidism, unspecified: Secondary | ICD-10-CM | POA: Diagnosis not present

## 2022-03-13 DIAGNOSIS — S72002F Fracture of unspecified part of neck of left femur, subsequent encounter for open fracture type IIIA, IIIB, or IIIC with routine healing: Secondary | ICD-10-CM | POA: Diagnosis not present

## 2022-03-13 DIAGNOSIS — F064 Anxiety disorder due to known physiological condition: Secondary | ICD-10-CM | POA: Diagnosis not present

## 2022-03-16 DIAGNOSIS — E782 Mixed hyperlipidemia: Secondary | ICD-10-CM | POA: Diagnosis not present

## 2022-03-16 DIAGNOSIS — F064 Anxiety disorder due to known physiological condition: Secondary | ICD-10-CM | POA: Diagnosis not present

## 2022-03-16 DIAGNOSIS — S72142A Displaced intertrochanteric fracture of left femur, initial encounter for closed fracture: Secondary | ICD-10-CM | POA: Diagnosis not present

## 2022-03-16 DIAGNOSIS — D518 Other vitamin B12 deficiency anemias: Secondary | ICD-10-CM | POA: Diagnosis not present

## 2022-03-16 DIAGNOSIS — E876 Hypokalemia: Secondary | ICD-10-CM | POA: Diagnosis not present

## 2022-03-16 DIAGNOSIS — I10 Essential (primary) hypertension: Secondary | ICD-10-CM | POA: Diagnosis not present

## 2022-03-16 DIAGNOSIS — E119 Type 2 diabetes mellitus without complications: Secondary | ICD-10-CM | POA: Diagnosis not present

## 2022-03-16 DIAGNOSIS — S72002F Fracture of unspecified part of neck of left femur, subsequent encounter for open fracture type IIIA, IIIB, or IIIC with routine healing: Secondary | ICD-10-CM | POA: Diagnosis not present

## 2022-03-16 DIAGNOSIS — E039 Hypothyroidism, unspecified: Secondary | ICD-10-CM | POA: Diagnosis not present

## 2022-03-16 DIAGNOSIS — M6281 Muscle weakness (generalized): Secondary | ICD-10-CM | POA: Diagnosis not present

## 2022-03-20 DIAGNOSIS — E119 Type 2 diabetes mellitus without complications: Secondary | ICD-10-CM | POA: Diagnosis not present

## 2022-03-20 DIAGNOSIS — E039 Hypothyroidism, unspecified: Secondary | ICD-10-CM | POA: Diagnosis not present

## 2022-03-20 DIAGNOSIS — E876 Hypokalemia: Secondary | ICD-10-CM | POA: Diagnosis not present

## 2022-03-20 DIAGNOSIS — I10 Essential (primary) hypertension: Secondary | ICD-10-CM | POA: Diagnosis not present

## 2022-03-20 DIAGNOSIS — F064 Anxiety disorder due to known physiological condition: Secondary | ICD-10-CM | POA: Diagnosis not present

## 2022-03-20 DIAGNOSIS — M6281 Muscle weakness (generalized): Secondary | ICD-10-CM | POA: Diagnosis not present

## 2022-03-20 DIAGNOSIS — S72002F Fracture of unspecified part of neck of left femur, subsequent encounter for open fracture type IIIA, IIIB, or IIIC with routine healing: Secondary | ICD-10-CM | POA: Diagnosis not present

## 2022-03-20 DIAGNOSIS — E782 Mixed hyperlipidemia: Secondary | ICD-10-CM | POA: Diagnosis not present

## 2022-03-20 DIAGNOSIS — D518 Other vitamin B12 deficiency anemias: Secondary | ICD-10-CM | POA: Diagnosis not present

## 2022-03-23 DIAGNOSIS — E876 Hypokalemia: Secondary | ICD-10-CM | POA: Diagnosis not present

## 2022-03-23 DIAGNOSIS — F0154 Vascular dementia, unspecified severity, with anxiety: Secondary | ICD-10-CM | POA: Diagnosis not present

## 2022-03-23 DIAGNOSIS — F064 Anxiety disorder due to known physiological condition: Secondary | ICD-10-CM | POA: Diagnosis not present

## 2022-03-23 DIAGNOSIS — D518 Other vitamin B12 deficiency anemias: Secondary | ICD-10-CM | POA: Diagnosis not present

## 2022-03-23 DIAGNOSIS — E782 Mixed hyperlipidemia: Secondary | ICD-10-CM | POA: Diagnosis not present

## 2022-03-23 DIAGNOSIS — E039 Hypothyroidism, unspecified: Secondary | ICD-10-CM | POA: Diagnosis not present

## 2022-03-23 DIAGNOSIS — E119 Type 2 diabetes mellitus without complications: Secondary | ICD-10-CM | POA: Diagnosis not present

## 2022-03-23 DIAGNOSIS — M6281 Muscle weakness (generalized): Secondary | ICD-10-CM | POA: Diagnosis not present

## 2022-03-23 DIAGNOSIS — S72002F Fracture of unspecified part of neck of left femur, subsequent encounter for open fracture type IIIA, IIIB, or IIIC with routine healing: Secondary | ICD-10-CM | POA: Diagnosis not present

## 2022-03-23 DIAGNOSIS — I10 Essential (primary) hypertension: Secondary | ICD-10-CM | POA: Diagnosis not present

## 2022-03-30 DIAGNOSIS — F0390 Unspecified dementia without behavioral disturbance: Secondary | ICD-10-CM | POA: Diagnosis not present

## 2022-03-30 DIAGNOSIS — E559 Vitamin D deficiency, unspecified: Secondary | ICD-10-CM | POA: Diagnosis not present

## 2022-03-30 DIAGNOSIS — E782 Mixed hyperlipidemia: Secondary | ICD-10-CM | POA: Diagnosis not present

## 2022-03-30 DIAGNOSIS — E038 Other specified hypothyroidism: Secondary | ICD-10-CM | POA: Diagnosis not present

## 2022-03-30 DIAGNOSIS — D518 Other vitamin B12 deficiency anemias: Secondary | ICD-10-CM | POA: Diagnosis not present

## 2022-03-30 DIAGNOSIS — E119 Type 2 diabetes mellitus without complications: Secondary | ICD-10-CM | POA: Diagnosis not present

## 2022-03-30 DIAGNOSIS — I10 Essential (primary) hypertension: Secondary | ICD-10-CM | POA: Diagnosis not present

## 2022-04-07 NOTE — Progress Notes (Signed)
[facility] Quality Team Note  Name: Hannah Duran Date of Birth: June 25, 1934 MRN: [id number] Date: 04/07/2022  [facility] Quality Team has reviewed this patient's chart, please see recommendations below:  [facility] Quality Other;  Patient has Quality Gap for Osteoporosis Management in Women and it is recommended that she is scheduled for a Bone Density scan prior to 09/02/2022. Please address at next visit with PCP.

## 2022-04-13 DIAGNOSIS — D518 Other vitamin B12 deficiency anemias: Secondary | ICD-10-CM | POA: Diagnosis not present

## 2022-04-13 DIAGNOSIS — S72002F Fracture of unspecified part of neck of left femur, subsequent encounter for open fracture type IIIA, IIIB, or IIIC with routine healing: Secondary | ICD-10-CM | POA: Diagnosis not present

## 2022-04-13 DIAGNOSIS — M6281 Muscle weakness (generalized): Secondary | ICD-10-CM | POA: Diagnosis not present

## 2022-04-13 DIAGNOSIS — E782 Mixed hyperlipidemia: Secondary | ICD-10-CM | POA: Diagnosis not present

## 2022-04-13 DIAGNOSIS — I10 Essential (primary) hypertension: Secondary | ICD-10-CM | POA: Diagnosis not present

## 2022-04-13 DIAGNOSIS — E119 Type 2 diabetes mellitus without complications: Secondary | ICD-10-CM | POA: Diagnosis not present

## 2022-04-13 DIAGNOSIS — E876 Hypokalemia: Secondary | ICD-10-CM | POA: Diagnosis not present

## 2022-04-13 DIAGNOSIS — F064 Anxiety disorder due to known physiological condition: Secondary | ICD-10-CM | POA: Diagnosis not present

## 2022-04-13 DIAGNOSIS — E039 Hypothyroidism, unspecified: Secondary | ICD-10-CM | POA: Diagnosis not present

## 2022-04-14 DIAGNOSIS — M6281 Muscle weakness (generalized): Secondary | ICD-10-CM | POA: Diagnosis not present

## 2022-04-14 DIAGNOSIS — I10 Essential (primary) hypertension: Secondary | ICD-10-CM | POA: Diagnosis not present

## 2022-04-14 DIAGNOSIS — S72002F Fracture of unspecified part of neck of left femur, subsequent encounter for open fracture type IIIA, IIIB, or IIIC with routine healing: Secondary | ICD-10-CM | POA: Diagnosis not present

## 2022-04-14 DIAGNOSIS — D518 Other vitamin B12 deficiency anemias: Secondary | ICD-10-CM | POA: Diagnosis not present

## 2022-04-14 DIAGNOSIS — E119 Type 2 diabetes mellitus without complications: Secondary | ICD-10-CM | POA: Diagnosis not present

## 2022-04-14 DIAGNOSIS — I890 Lymphedema, not elsewhere classified: Secondary | ICD-10-CM | POA: Diagnosis not present

## 2022-04-15 DIAGNOSIS — L89620 Pressure ulcer of left heel, unstageable: Secondary | ICD-10-CM | POA: Diagnosis not present

## 2022-04-16 DIAGNOSIS — E119 Type 2 diabetes mellitus without complications: Secondary | ICD-10-CM | POA: Diagnosis not present

## 2022-04-21 DIAGNOSIS — L89610 Pressure ulcer of right heel, unstageable: Secondary | ICD-10-CM | POA: Diagnosis not present

## 2022-04-21 DIAGNOSIS — E1151 Type 2 diabetes mellitus with diabetic peripheral angiopathy without gangrene: Secondary | ICD-10-CM | POA: Diagnosis not present

## 2022-04-21 DIAGNOSIS — L89620 Pressure ulcer of left heel, unstageable: Secondary | ICD-10-CM | POA: Diagnosis not present

## 2022-04-21 DIAGNOSIS — B351 Tinea unguium: Secondary | ICD-10-CM | POA: Diagnosis not present

## 2022-04-21 DIAGNOSIS — Z7984 Long term (current) use of oral hypoglycemic drugs: Secondary | ICD-10-CM | POA: Diagnosis not present

## 2022-04-22 DIAGNOSIS — L89620 Pressure ulcer of left heel, unstageable: Secondary | ICD-10-CM | POA: Diagnosis not present

## 2022-04-22 DIAGNOSIS — L89612 Pressure ulcer of right heel, stage 2: Secondary | ICD-10-CM | POA: Diagnosis not present

## 2022-04-25 DIAGNOSIS — I10 Essential (primary) hypertension: Secondary | ICD-10-CM | POA: Diagnosis not present

## 2022-04-29 DIAGNOSIS — L89612 Pressure ulcer of right heel, stage 2: Secondary | ICD-10-CM | POA: Diagnosis not present

## 2022-04-29 DIAGNOSIS — L89620 Pressure ulcer of left heel, unstageable: Secondary | ICD-10-CM | POA: Diagnosis not present

## 2022-04-30 DIAGNOSIS — E782 Mixed hyperlipidemia: Secondary | ICD-10-CM | POA: Diagnosis not present

## 2022-04-30 DIAGNOSIS — F0390 Unspecified dementia without behavioral disturbance: Secondary | ICD-10-CM | POA: Diagnosis not present

## 2022-04-30 DIAGNOSIS — E038 Other specified hypothyroidism: Secondary | ICD-10-CM | POA: Diagnosis not present

## 2022-04-30 DIAGNOSIS — I10 Essential (primary) hypertension: Secondary | ICD-10-CM | POA: Diagnosis not present

## 2022-04-30 DIAGNOSIS — E559 Vitamin D deficiency, unspecified: Secondary | ICD-10-CM | POA: Diagnosis not present

## 2022-04-30 DIAGNOSIS — E119 Type 2 diabetes mellitus without complications: Secondary | ICD-10-CM | POA: Diagnosis not present

## 2022-05-06 DIAGNOSIS — L89620 Pressure ulcer of left heel, unstageable: Secondary | ICD-10-CM | POA: Diagnosis not present

## 2022-05-13 DIAGNOSIS — L89620 Pressure ulcer of left heel, unstageable: Secondary | ICD-10-CM | POA: Diagnosis not present

## 2022-05-20 DIAGNOSIS — L89620 Pressure ulcer of left heel, unstageable: Secondary | ICD-10-CM | POA: Diagnosis not present

## 2022-05-27 DIAGNOSIS — L89620 Pressure ulcer of left heel, unstageable: Secondary | ICD-10-CM | POA: Diagnosis not present

## 2022-05-29 DIAGNOSIS — E782 Mixed hyperlipidemia: Secondary | ICD-10-CM | POA: Diagnosis not present

## 2022-05-29 DIAGNOSIS — I10 Essential (primary) hypertension: Secondary | ICD-10-CM | POA: Diagnosis not present

## 2022-05-29 DIAGNOSIS — E119 Type 2 diabetes mellitus without complications: Secondary | ICD-10-CM | POA: Diagnosis not present

## 2022-06-04 DIAGNOSIS — L89620 Pressure ulcer of left heel, unstageable: Secondary | ICD-10-CM | POA: Diagnosis not present

## 2022-06-09 DIAGNOSIS — I10 Essential (primary) hypertension: Secondary | ICD-10-CM | POA: Diagnosis not present

## 2022-06-09 DIAGNOSIS — E119 Type 2 diabetes mellitus without complications: Secondary | ICD-10-CM | POA: Diagnosis not present

## 2022-06-09 DIAGNOSIS — M6281 Muscle weakness (generalized): Secondary | ICD-10-CM | POA: Diagnosis not present

## 2022-06-09 DIAGNOSIS — S72002F Fracture of unspecified part of neck of left femur, subsequent encounter for open fracture type IIIA, IIIB, or IIIC with routine healing: Secondary | ICD-10-CM | POA: Diagnosis not present

## 2022-06-09 DIAGNOSIS — I890 Lymphedema, not elsewhere classified: Secondary | ICD-10-CM | POA: Diagnosis not present

## 2022-06-10 DIAGNOSIS — L89620 Pressure ulcer of left heel, unstageable: Secondary | ICD-10-CM | POA: Diagnosis not present

## 2022-06-17 DIAGNOSIS — Z23 Encounter for immunization: Secondary | ICD-10-CM | POA: Diagnosis not present

## 2022-06-22 DIAGNOSIS — B351 Tinea unguium: Secondary | ICD-10-CM | POA: Diagnosis not present

## 2022-06-22 DIAGNOSIS — Z7984 Long term (current) use of oral hypoglycemic drugs: Secondary | ICD-10-CM | POA: Diagnosis not present

## 2022-06-22 DIAGNOSIS — L603 Nail dystrophy: Secondary | ICD-10-CM | POA: Diagnosis not present

## 2022-06-22 DIAGNOSIS — E1151 Type 2 diabetes mellitus with diabetic peripheral angiopathy without gangrene: Secondary | ICD-10-CM | POA: Diagnosis not present

## 2022-06-24 DIAGNOSIS — L89624 Pressure ulcer of left heel, stage 4: Secondary | ICD-10-CM | POA: Diagnosis not present

## 2022-06-25 DIAGNOSIS — K559 Vascular disorder of intestine, unspecified: Secondary | ICD-10-CM | POA: Diagnosis not present

## 2022-06-25 DIAGNOSIS — E119 Type 2 diabetes mellitus without complications: Secondary | ICD-10-CM | POA: Diagnosis not present

## 2022-06-25 DIAGNOSIS — I10 Essential (primary) hypertension: Secondary | ICD-10-CM | POA: Diagnosis not present

## 2022-06-26 DIAGNOSIS — F064 Anxiety disorder due to known physiological condition: Secondary | ICD-10-CM | POA: Diagnosis not present

## 2022-06-26 DIAGNOSIS — D518 Other vitamin B12 deficiency anemias: Secondary | ICD-10-CM | POA: Diagnosis not present

## 2022-06-26 DIAGNOSIS — E876 Hypokalemia: Secondary | ICD-10-CM | POA: Diagnosis not present

## 2022-07-01 DIAGNOSIS — L89624 Pressure ulcer of left heel, stage 4: Secondary | ICD-10-CM | POA: Diagnosis not present

## 2022-07-01 DIAGNOSIS — R1311 Dysphagia, oral phase: Secondary | ICD-10-CM | POA: Diagnosis not present

## 2022-07-02 DIAGNOSIS — M79672 Pain in left foot: Secondary | ICD-10-CM | POA: Diagnosis not present

## 2022-07-02 DIAGNOSIS — I2510 Atherosclerotic heart disease of native coronary artery without angina pectoris: Secondary | ICD-10-CM | POA: Diagnosis not present

## 2022-07-02 DIAGNOSIS — E119 Type 2 diabetes mellitus without complications: Secondary | ICD-10-CM | POA: Diagnosis not present

## 2022-07-02 DIAGNOSIS — D649 Anemia, unspecified: Secondary | ICD-10-CM | POA: Diagnosis not present

## 2022-07-02 DIAGNOSIS — L089 Local infection of the skin and subcutaneous tissue, unspecified: Secondary | ICD-10-CM | POA: Diagnosis not present

## 2022-07-08 DIAGNOSIS — L89624 Pressure ulcer of left heel, stage 4: Secondary | ICD-10-CM | POA: Diagnosis not present

## 2022-07-25 DIAGNOSIS — I119 Hypertensive heart disease without heart failure: Secondary | ICD-10-CM | POA: Diagnosis not present

## 2022-07-25 DIAGNOSIS — K559 Vascular disorder of intestine, unspecified: Secondary | ICD-10-CM | POA: Diagnosis not present

## 2022-07-27 DIAGNOSIS — E782 Mixed hyperlipidemia: Secondary | ICD-10-CM | POA: Diagnosis not present

## 2022-07-27 DIAGNOSIS — I119 Hypertensive heart disease without heart failure: Secondary | ICD-10-CM | POA: Diagnosis not present

## 2022-07-27 DIAGNOSIS — K1239 Other oral mucositis (ulcerative): Secondary | ICD-10-CM | POA: Diagnosis not present

## 2022-07-27 DIAGNOSIS — E038 Other specified hypothyroidism: Secondary | ICD-10-CM | POA: Diagnosis not present

## 2022-08-31 DEATH — deceased
